# Patient Record
Sex: Female | Born: 1955 | Race: White | Hispanic: No | Smoking: Former smoker
Health system: Southern US, Community
[De-identification: ages and names within clinical notes are randomized; demographics above are authoritative.]

## PROBLEM LIST (undated history)

## (undated) DIAGNOSIS — K219 Gastro-esophageal reflux disease without esophagitis: Secondary | ICD-10-CM

## (undated) DIAGNOSIS — M1990 Unspecified osteoarthritis, unspecified site: Secondary | ICD-10-CM

## (undated) DIAGNOSIS — F909 Attention-deficit hyperactivity disorder, unspecified type: Secondary | ICD-10-CM

## (undated) DIAGNOSIS — R351 Nocturia: Secondary | ICD-10-CM

## (undated) DIAGNOSIS — G4700 Insomnia, unspecified: Secondary | ICD-10-CM

## (undated) DIAGNOSIS — F419 Anxiety disorder, unspecified: Secondary | ICD-10-CM

## (undated) DIAGNOSIS — Z8719 Personal history of other diseases of the digestive system: Secondary | ICD-10-CM

## (undated) DIAGNOSIS — G709 Myoneural disorder, unspecified: Secondary | ICD-10-CM

## (undated) DIAGNOSIS — IMO0001 Reserved for inherently not codable concepts without codable children: Secondary | ICD-10-CM

## (undated) DIAGNOSIS — K5732 Diverticulitis of large intestine without perforation or abscess without bleeding: Secondary | ICD-10-CM

## (undated) DIAGNOSIS — I10 Essential (primary) hypertension: Secondary | ICD-10-CM

## (undated) DIAGNOSIS — B1920 Unspecified viral hepatitis C without hepatic coma: Secondary | ICD-10-CM

## (undated) HISTORY — DX: Unspecified osteoarthritis, unspecified site: M19.90

## (undated) HISTORY — DX: Gastro-esophageal reflux disease without esophagitis: K21.9

## (undated) HISTORY — PX: LEG TENDON SURGERY: SHX1004

## (undated) HISTORY — PX: OTHER SURGICAL HISTORY: SHX169

## (undated) HISTORY — PX: VAGINAL HYSTERECTOMY: SUR661

## (undated) HISTORY — DX: Diverticulitis of large intestine without perforation or abscess without bleeding: K57.32

## (undated) HISTORY — PX: ABDOMINAL SURGERY: SHX537

## (undated) HISTORY — DX: Essential (primary) hypertension: I10

## (undated) HISTORY — DX: Myoneural disorder, unspecified: G70.9

---

## 1980-12-02 HISTORY — PX: ECTOPIC PREGNANCY SURGERY: SHX613

## 2003-10-11 ENCOUNTER — Other Ambulatory Visit: Payer: Self-pay

## 2003-12-17 ENCOUNTER — Other Ambulatory Visit: Payer: Self-pay

## 2004-11-15 ENCOUNTER — Inpatient Hospital Stay (HOSPITAL_COMMUNITY): Admission: EM | Admit: 2004-11-15 | Discharge: 2004-11-21 | Payer: Self-pay | Admitting: Emergency Medicine

## 2004-11-15 ENCOUNTER — Ambulatory Visit: Payer: Self-pay | Admitting: Gastroenterology

## 2004-12-02 HISTORY — PX: CHOLECYSTECTOMY: SHX55

## 2004-12-11 ENCOUNTER — Ambulatory Visit: Payer: Self-pay | Admitting: Gastroenterology

## 2004-12-12 ENCOUNTER — Ambulatory Visit: Payer: Self-pay | Admitting: Gastroenterology

## 2005-03-09 ENCOUNTER — Emergency Department (HOSPITAL_COMMUNITY): Admission: EM | Admit: 2005-03-09 | Discharge: 2005-03-09 | Payer: Self-pay | Admitting: Emergency Medicine

## 2005-06-14 ENCOUNTER — Emergency Department (HOSPITAL_COMMUNITY): Admission: EM | Admit: 2005-06-14 | Discharge: 2005-06-15 | Payer: Self-pay | Admitting: Emergency Medicine

## 2005-06-24 ENCOUNTER — Ambulatory Visit: Payer: Self-pay | Admitting: Gastroenterology

## 2005-06-25 ENCOUNTER — Ambulatory Visit: Payer: Self-pay | Admitting: Gastroenterology

## 2005-09-08 ENCOUNTER — Emergency Department (HOSPITAL_COMMUNITY): Admission: EM | Admit: 2005-09-08 | Discharge: 2005-09-08 | Payer: Self-pay | Admitting: Family Medicine

## 2005-09-10 ENCOUNTER — Encounter: Admission: RE | Admit: 2005-09-10 | Discharge: 2005-09-10 | Payer: Self-pay | Admitting: Gastroenterology

## 2005-10-11 ENCOUNTER — Emergency Department (HOSPITAL_COMMUNITY): Admission: EM | Admit: 2005-10-11 | Discharge: 2005-10-12 | Payer: Self-pay | Admitting: Emergency Medicine

## 2005-11-08 ENCOUNTER — Ambulatory Visit: Payer: Self-pay | Admitting: Gastroenterology

## 2005-11-12 ENCOUNTER — Ambulatory Visit: Payer: Self-pay | Admitting: Gastroenterology

## 2005-12-04 ENCOUNTER — Ambulatory Visit: Payer: Self-pay | Admitting: Gastroenterology

## 2005-12-19 ENCOUNTER — Emergency Department (HOSPITAL_COMMUNITY): Admission: EM | Admit: 2005-12-19 | Discharge: 2005-12-19 | Payer: Self-pay | Admitting: Family Medicine

## 2006-01-21 ENCOUNTER — Ambulatory Visit: Payer: Self-pay | Admitting: Gastroenterology

## 2006-04-17 ENCOUNTER — Ambulatory Visit: Payer: Self-pay | Admitting: Gastroenterology

## 2006-05-01 ENCOUNTER — Ambulatory Visit: Payer: Self-pay | Admitting: Gastroenterology

## 2006-05-08 ENCOUNTER — Emergency Department (HOSPITAL_COMMUNITY): Admission: EM | Admit: 2006-05-08 | Discharge: 2006-05-09 | Payer: Self-pay | Admitting: Emergency Medicine

## 2006-05-09 ENCOUNTER — Ambulatory Visit: Payer: Self-pay | Admitting: Internal Medicine

## 2006-05-15 ENCOUNTER — Ambulatory Visit: Payer: Self-pay | Admitting: Gastroenterology

## 2006-05-29 ENCOUNTER — Ambulatory Visit: Payer: Self-pay | Admitting: Gastroenterology

## 2006-06-05 ENCOUNTER — Ambulatory Visit (HOSPITAL_COMMUNITY): Admission: RE | Admit: 2006-06-05 | Discharge: 2006-06-05 | Payer: Self-pay | Admitting: Gastroenterology

## 2006-06-12 ENCOUNTER — Ambulatory Visit: Payer: Self-pay | Admitting: Gastroenterology

## 2006-06-26 ENCOUNTER — Ambulatory Visit: Payer: Self-pay | Admitting: Gastroenterology

## 2006-07-17 ENCOUNTER — Ambulatory Visit: Payer: Self-pay | Admitting: Family Medicine

## 2006-07-17 ENCOUNTER — Observation Stay (HOSPITAL_COMMUNITY): Admission: EM | Admit: 2006-07-17 | Discharge: 2006-07-18 | Payer: Self-pay | Admitting: Emergency Medicine

## 2006-07-21 ENCOUNTER — Ambulatory Visit: Payer: Self-pay | Admitting: Gastroenterology

## 2006-07-24 ENCOUNTER — Ambulatory Visit: Payer: Self-pay | Admitting: Gastroenterology

## 2006-08-07 ENCOUNTER — Ambulatory Visit: Payer: Self-pay | Admitting: Gastroenterology

## 2006-08-22 ENCOUNTER — Ambulatory Visit: Payer: Self-pay | Admitting: Gastroenterology

## 2006-09-04 ENCOUNTER — Ambulatory Visit: Payer: Self-pay | Admitting: Gastroenterology

## 2006-09-10 ENCOUNTER — Emergency Department (HOSPITAL_COMMUNITY): Admission: EM | Admit: 2006-09-10 | Discharge: 2006-09-10 | Payer: Self-pay | Admitting: Emergency Medicine

## 2006-09-18 ENCOUNTER — Ambulatory Visit: Payer: Self-pay | Admitting: Gastroenterology

## 2006-09-23 ENCOUNTER — Ambulatory Visit: Payer: Self-pay | Admitting: Gastroenterology

## 2006-09-25 ENCOUNTER — Ambulatory Visit: Payer: Self-pay | Admitting: Gastroenterology

## 2006-11-17 ENCOUNTER — Ambulatory Visit: Payer: Self-pay | Admitting: Gastroenterology

## 2006-11-20 ENCOUNTER — Ambulatory Visit: Payer: Self-pay | Admitting: Gastroenterology

## 2006-12-07 ENCOUNTER — Emergency Department (HOSPITAL_COMMUNITY): Admission: EM | Admit: 2006-12-07 | Discharge: 2006-12-07 | Payer: Self-pay | Admitting: Emergency Medicine

## 2007-01-30 ENCOUNTER — Inpatient Hospital Stay (HOSPITAL_COMMUNITY): Admission: EM | Admit: 2007-01-30 | Discharge: 2007-02-05 | Payer: Self-pay | Admitting: Emergency Medicine

## 2007-01-30 ENCOUNTER — Encounter (INDEPENDENT_AMBULATORY_CARE_PROVIDER_SITE_OTHER): Payer: Self-pay | Admitting: *Deleted

## 2007-02-08 ENCOUNTER — Emergency Department (HOSPITAL_COMMUNITY): Admission: EM | Admit: 2007-02-08 | Discharge: 2007-02-08 | Payer: Self-pay | Admitting: Emergency Medicine

## 2007-04-16 ENCOUNTER — Ambulatory Visit: Payer: Self-pay | Admitting: Gastroenterology

## 2007-06-09 ENCOUNTER — Ambulatory Visit (HOSPITAL_COMMUNITY): Admission: RE | Admit: 2007-06-09 | Discharge: 2007-06-09 | Payer: Self-pay | Admitting: Gastroenterology

## 2007-07-04 IMAGING — CR DG ABDOMEN ACUTE W/ 1V CHEST
4 series · 4 of 4 positions shown · non-contrast
Comparison: Previous abdomen series of 05/09/06.

CLINICAL DATA: Lower abdominal pain.  Nausea.  
ACUTE ABDOMEN SERIES WITH CHEST:

[w chest pa]
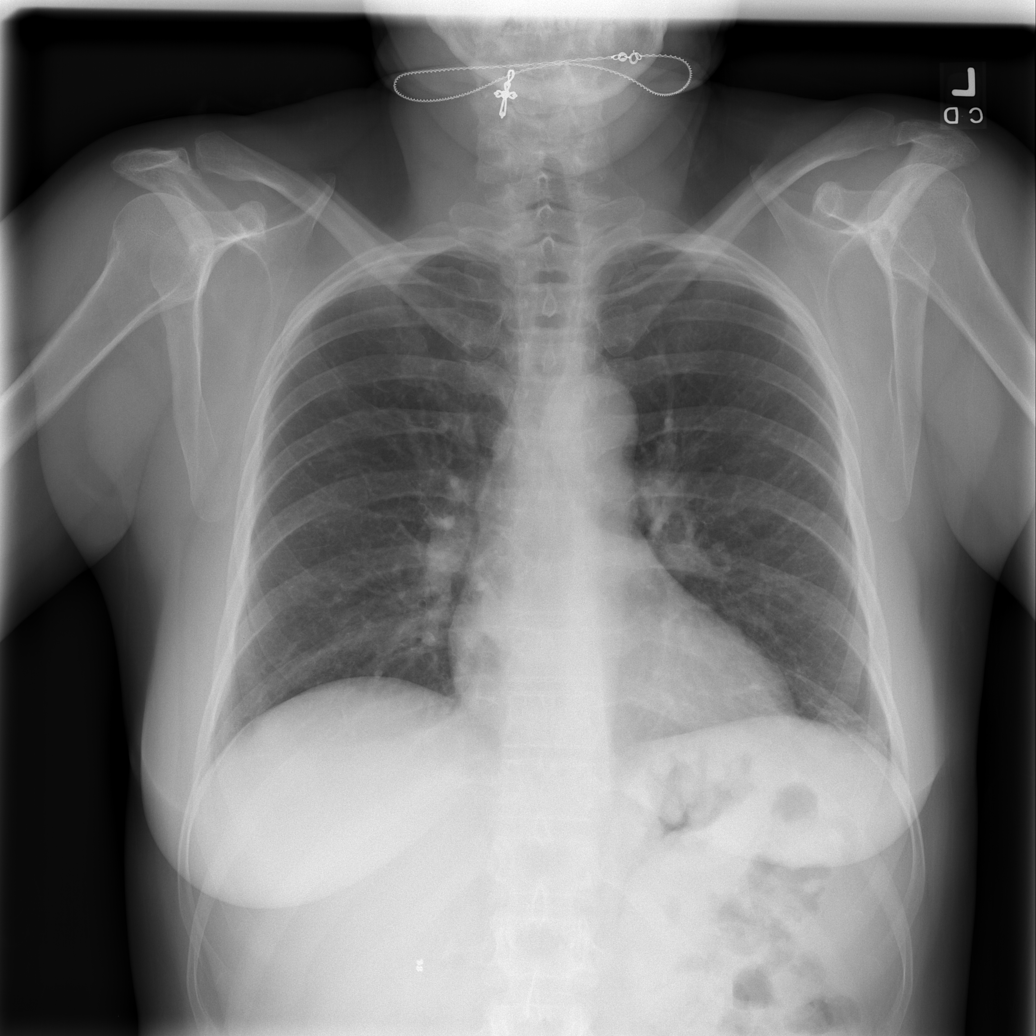

[w abdomen upright *]
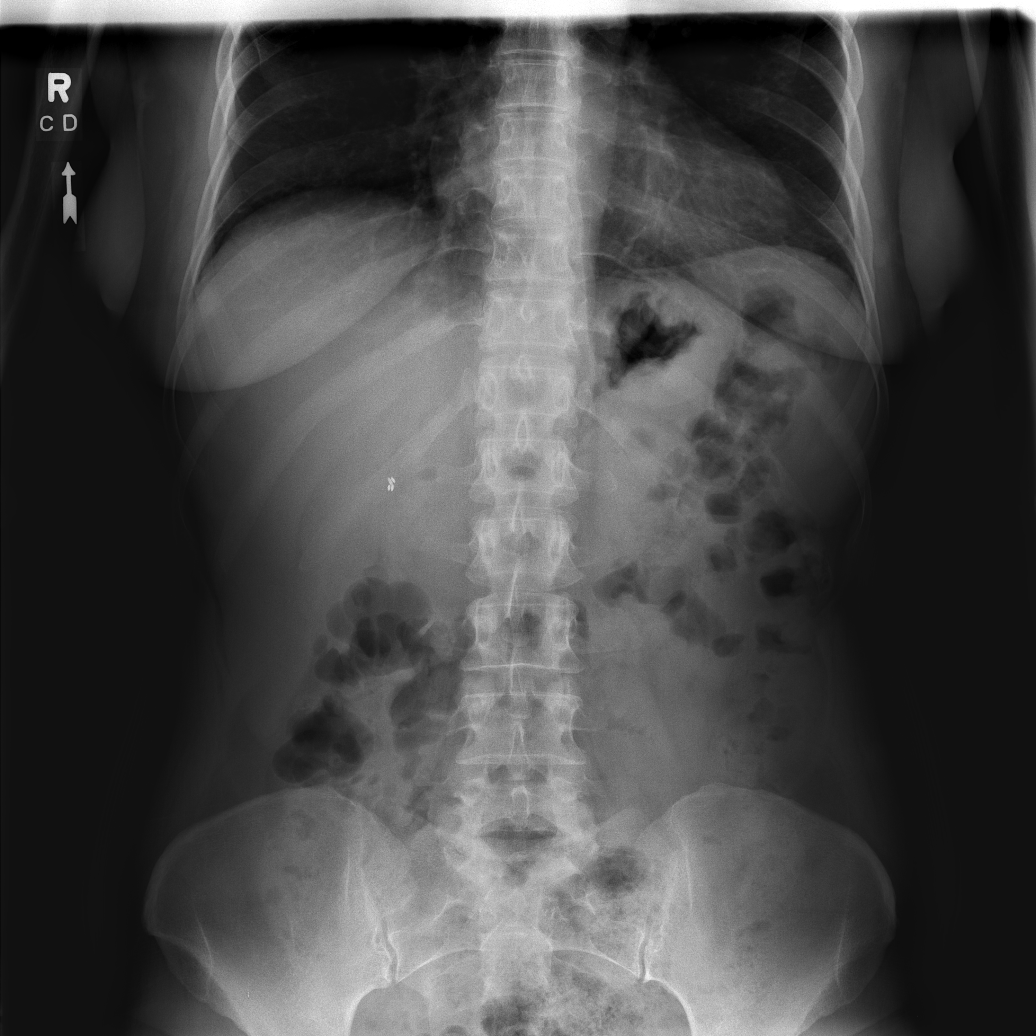

[t abdomen supine (1 of 2)]
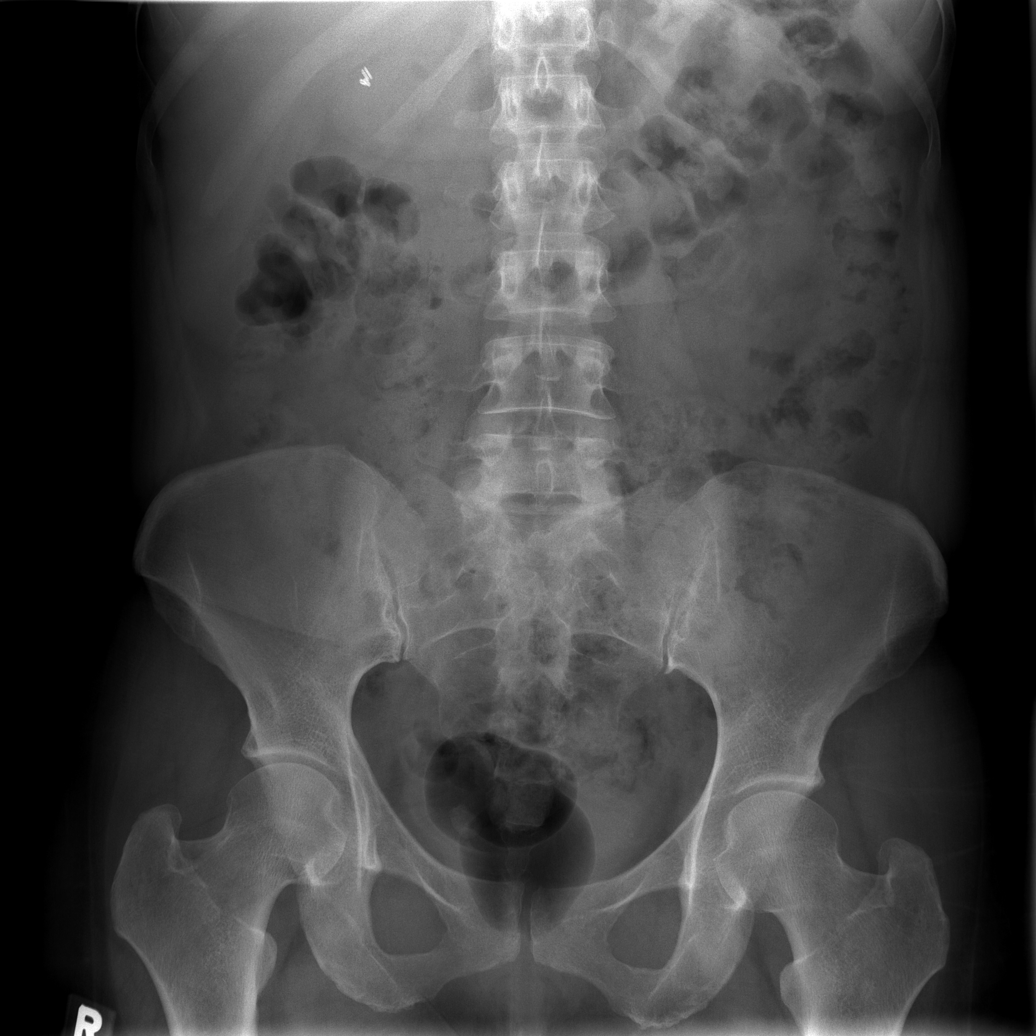

[t abdomen supine (2 of 2)]
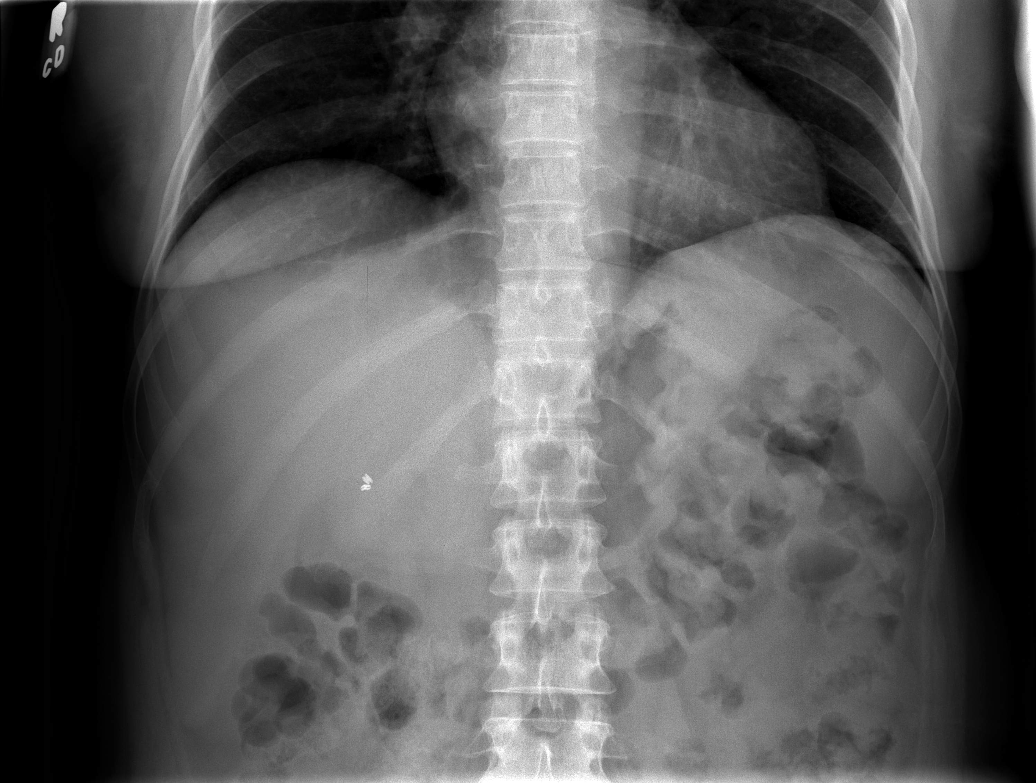

[4 of 4 positions shown; findings below may reference images not displayed]

FINDINGS: Slightly reduced lung volumes.  Mild scarring or subsegmental atelectasis left base.  No infiltrates, failure or pneumothorax.  Cardiac size is normal.
Flat and erect abdomen reveals previous cholecystectomy.  Nondilated bowel loops are seen.  There is no obstruction or free air.  I do not see significant signs of constipation, as was noted previously.
IMPRESSION: Nonspecific abdomen.

## 2008-04-23 ENCOUNTER — Emergency Department (HOSPITAL_COMMUNITY): Admission: EM | Admit: 2008-04-23 | Discharge: 2008-04-24 | Payer: Self-pay | Admitting: Emergency Medicine

## 2008-05-19 ENCOUNTER — Ambulatory Visit: Admission: RE | Admit: 2008-05-19 | Discharge: 2008-05-19 | Payer: Self-pay | Admitting: Internal Medicine

## 2008-05-19 ENCOUNTER — Ambulatory Visit: Payer: Self-pay | Admitting: Vascular Surgery

## 2008-05-19 ENCOUNTER — Encounter: Payer: Self-pay | Admitting: Internal Medicine

## 2008-09-15 ENCOUNTER — Ambulatory Visit: Payer: Self-pay | Admitting: Gastroenterology

## 2009-01-03 ENCOUNTER — Encounter: Admission: RE | Admit: 2009-01-03 | Discharge: 2009-01-03 | Payer: Self-pay | Admitting: Family Medicine

## 2009-01-05 ENCOUNTER — Inpatient Hospital Stay (HOSPITAL_COMMUNITY): Admission: AD | Admit: 2009-01-05 | Discharge: 2009-01-07 | Payer: Self-pay | Admitting: Family Medicine

## 2009-01-05 ENCOUNTER — Ambulatory Visit: Payer: Self-pay | Admitting: Gastroenterology

## 2009-02-09 ENCOUNTER — Encounter: Payer: Self-pay | Admitting: Gastroenterology

## 2009-07-18 ENCOUNTER — Encounter (INDEPENDENT_AMBULATORY_CARE_PROVIDER_SITE_OTHER): Payer: Self-pay | Admitting: *Deleted

## 2009-07-18 ENCOUNTER — Emergency Department (HOSPITAL_COMMUNITY): Admission: EM | Admit: 2009-07-18 | Discharge: 2009-07-18 | Payer: Self-pay | Admitting: Emergency Medicine

## 2009-10-02 ENCOUNTER — Telehealth: Payer: Self-pay | Admitting: Gastroenterology

## 2009-10-02 ENCOUNTER — Ambulatory Visit: Payer: Self-pay | Admitting: Gastroenterology

## 2009-10-02 DIAGNOSIS — K625 Hemorrhage of anus and rectum: Secondary | ICD-10-CM | POA: Insufficient documentation

## 2009-10-02 DIAGNOSIS — R1013 Epigastric pain: Secondary | ICD-10-CM | POA: Insufficient documentation

## 2009-10-02 DIAGNOSIS — F411 Generalized anxiety disorder: Secondary | ICD-10-CM | POA: Insufficient documentation

## 2009-10-10 ENCOUNTER — Ambulatory Visit: Payer: Self-pay | Admitting: Gastroenterology

## 2009-10-10 ENCOUNTER — Encounter: Payer: Self-pay | Admitting: Gastroenterology

## 2009-10-11 ENCOUNTER — Encounter: Payer: Self-pay | Admitting: Gastroenterology

## 2009-10-13 ENCOUNTER — Encounter (INDEPENDENT_AMBULATORY_CARE_PROVIDER_SITE_OTHER): Payer: Self-pay | Admitting: *Deleted

## 2009-10-16 ENCOUNTER — Ambulatory Visit: Payer: Self-pay | Admitting: Gastroenterology

## 2009-10-19 ENCOUNTER — Ambulatory Visit: Payer: Self-pay | Admitting: Gastroenterology

## 2009-12-02 HISTORY — PX: HERNIA REPAIR: SHX51

## 2009-12-02 HISTORY — PX: COLECTOMY: SHX59

## 2009-12-02 HISTORY — PX: APPENDECTOMY: SHX54

## 2010-02-28 ENCOUNTER — Ambulatory Visit: Payer: Self-pay | Admitting: Internal Medicine

## 2010-02-28 ENCOUNTER — Observation Stay (HOSPITAL_COMMUNITY): Admission: AD | Admit: 2010-02-28 | Discharge: 2010-03-02 | Payer: Self-pay | Admitting: Gastroenterology

## 2010-02-28 ENCOUNTER — Encounter: Payer: Self-pay | Admitting: Gastroenterology

## 2010-02-28 DIAGNOSIS — F988 Other specified behavioral and emotional disorders with onset usually occurring in childhood and adolescence: Secondary | ICD-10-CM | POA: Insufficient documentation

## 2010-02-28 DIAGNOSIS — Z8719 Personal history of other diseases of the digestive system: Secondary | ICD-10-CM | POA: Insufficient documentation

## 2010-02-28 DIAGNOSIS — R109 Unspecified abdominal pain: Secondary | ICD-10-CM | POA: Insufficient documentation

## 2010-03-02 ENCOUNTER — Encounter: Payer: Self-pay | Admitting: Gastroenterology

## 2010-03-12 ENCOUNTER — Ambulatory Visit: Payer: Self-pay | Admitting: Gastroenterology

## 2010-03-13 ENCOUNTER — Telehealth: Payer: Self-pay | Admitting: Gastroenterology

## 2010-03-19 ENCOUNTER — Encounter: Payer: Self-pay | Admitting: Gastroenterology

## 2010-04-06 ENCOUNTER — Inpatient Hospital Stay (HOSPITAL_COMMUNITY): Admission: RE | Admit: 2010-04-06 | Discharge: 2010-04-12 | Payer: Self-pay | Admitting: General Surgery

## 2010-04-06 ENCOUNTER — Encounter (INDEPENDENT_AMBULATORY_CARE_PROVIDER_SITE_OTHER): Payer: Self-pay | Admitting: General Surgery

## 2011-01-01 NOTE — Progress Notes (Signed)
Summary: Triage--Pain   Phone Note Call from Patient Call back at [phone]   Caller: Patient Call For: Arlyce Dice Reason for Call: Talk to Nurse Summary of Call: Patient wants to speak to nurse because her stomach swollen and sore. Initial call taken by: Tawni Levy,  March 13, 2010 2:27 PM  Follow-up for Phone Call        Pt. was seen in the office yesterday, felt better.  Today pt. c/o abd. swelling and mid.abd.pain.  Today is the last day of her antibiotics. She Is scheduled to see the surgeon on 03-19-10.   1) Clear liquids x24 hours. Advance to soft,bland diet x2-4 days. Advanced as tolerated. 2) Complete antibiotics 3) Continue Vicodin PRN 4) I will call pt., if new orders, after MD reviews.  [facility] PLEASE ADVISE   Follow-up by: Laureen Ochs LPN,  March 13, 2010 2:48 PM  Additional Follow-up for Phone Call Additional follow up Details #1::        ok Additional Follow-up by: Louis Meckel MD,  March 13, 2010 4:04 PM

## 2011-01-01 NOTE — Assessment & Plan Note (Signed)
Summary: POST HOSPITAL F/U                 [facility]    History of Present Illness Visit Type: Follow-up Visit Primary GI MD: Melvia Heaps MD [facility] Primary Provider: Dani Gobble, PA-C Chief Complaint: Patient here for hospital f/u for diverticulitis. Patient c/o continued left lower quadrant abdominal pain and bloating. She denies any diarrhea or other GI problems. History of Present Illness:   Ms. Hackmann has returned following hospitalization for acute diverticulitis.  Since discharge her abdominal pain has subsided.  She remains on Cipro and Flagyl.  She has had at least 3 documented episodes of acute colonic diverticulitis.  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  Since 2005 she has had 13 CTs and MRIs for recurrent abdominal pain.  Colonoscopy demonstrates scattered diverticula throughout the entire colon.  Her last bout of acute diverticulitis involved the transverse colon.   GI Review of Systems    Reports abdominal pain, bloating, and  heartburn.     Location of  Abdominal pain: LLQ.    Denies acid reflux, belching, chest pain, dysphagia with liquids, dysphagia with solids, loss of appetite, nausea, vomiting, vomiting blood, weight loss, and  weight gain.      Reports diverticulosis, hemorrhoids, and  liver problems.     Denies anal fissure, black tarry stools, change in bowel habit, constipation, diarrhea, fecal incontinence, heme positive stool, irritable bowel syndrome, jaundice, light color stool, rectal bleeding, and  rectal pain.    Current Medications (verified): 1)  Metamucil 30.9 % Powd (Psyllium) .... As Needed 2)  Cipro 500 Mg Tabs (Ciprofloxacin Hcl) .... Take 1 Tablet By Mouth Two Times A Day 3)  Flagyl 500 Mg Tabs (Metronidazole) .... Take 1 Tablet By Mouth Two Times A Day 4)  Vicodin 5-500 Mg Tabs (Hydrocodone-Acetaminophen) .... Take 1 Tablet By Mouth Every 4-6 Hours As Needed 5)  Promethazine Hcl 25 Mg Tabs  (Promethazine Hcl) .... Take 1 Tablet As Needed For Nausea.  Allergies (verified): No Known Drug Allergies  Past History:  Past Medical History: Reviewed history from 02/28/2010 and no changes required. Diverticulitis Esophageal Stricture hyperplastic polyps 12/2004 Arthritis panic disorder Depression Sleep Apnea hx of hepatitis C Anxiety Disorder ADD Perforated Jejunal Diverticulum (foreign body)  Past Surgical History: Cholecystectomy Small bowel resection (jejunum) Hysterectomy Ectopic pregnancy- x 2 urethral cyst removed ovarian cystectomy  Family History: Family History of Heart Disease: Father, Paternal Grandmother Family History of Kidney Disease:Mother No FH of Colon Cancer: Family History of Pancreatic Cancer: Father Family History of Diabetes: Paternal Grandmother, Granddaughter  Social History: Occupation: Engineer, 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 -stopped 14 years ago Alcohol Use - yes-rare Daily Caffeine Use Illicit Drug Use - no  Review of Systems       The patient complains of anxiety-new, fatigue, fever, muscle pains/cramps, skin rash, and urination changes/pain.  The patient denies allergy/sinus, anemia, arthritis/joint pain, back pain, blood in urine, breast changes/lumps, change in vision, confusion, cough, coughing up blood, depression-new, fainting, headaches-new, hearing problems, heart murmur, heart rhythm changes, itching, menstrual pain, night sweats, nosebleeds, pregnancy symptoms, shortness of breath, sleeping problems, sore throat, swelling of feet/legs, swollen lymph glands, thirst - excessive , urination - excessive , urine leakage, vision changes, and voice change.    Vital Signs:  Patient profile:   55 year old female Height:      65 inches Weight:      158 pounds BMI:  26.39 BSA:     1.79 Pulse rate:   88 / minute Pulse rhythm:   regular BP sitting:   130 / 80  (left arm)  Vitals Entered By: Hortense Ramal CMA Duncan Dull)  (March 12, 2010 3:12 PM)  Physical Exam  Additional Exam:  She is a well-developed well-nourished female  Abdomen is without masses, tenderness organomegaly   Impression & Recommendations:  Problem # 1:  Hx of DIVERTICULITIS, HX OF (ICD-V12.79)  The patient has had recurrent colonic diverticulitis documented by CT scan at least 3 times in the past 6 years and numerous episodes of abdominal pain thought to be diverticulitis.  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  The patient is understandably concerned about recurrent episodes and  the need for emergency surgery and possible colostomy.  Elective surgery would require a subtotal colectomy because of the distribution of her diverticula.  Recommendations #1 surgical consultation for consideration of subtotal colectomy - Dr. Larey Brick  Orders: [facility] ([facility])  Patient Instructions: 1)  Go to Dr. Abbey Chatters 03/19/2010 arrive at 11:30am. 2)  Copy sent to : Dani Gobble, PA-C 3)  The medication list was reviewed and reconciled.  All changed / newly prescribed medications were explained.  A complete medication list was provided to the patient / caregiver.

## 2011-01-01 NOTE — Letter (Signed)
Summary: [facility]   Imported By: Sherian Rein 04/10/2010 09:34:39  _____________________________________________________________________  External Attachment:    Type:   Image     Comment:   External Document

## 2011-01-01 NOTE — Initial Assessments (Signed)
Summary: SEVERE ABD PAIN/HX. DIVERTICULITIS           (DR.KAPLAN PT.) ...    History of Present Illness Visit Type: Follow-up Visit Primary GI MD: Melvia Heaps MD [facility] Primary Provider: [facility] Chief Complaint: severe abdominal pain x 3 days; diverticulitis History of Present Illness:   Followed by Dr. Arlyce Dice for polyps, diverticulosis and esophageal strictures. She has history of recurrent diverticulitis, last hospitalized in Feb 2010.  Worked in for three day history of nausea, vomiting and mid to upper abdominal pain which has become severe over last two days. Pain unrelated to meals. Hurts worse to lay flat. Subjective fevers.  Patient is crying, appears very uncomfortable. No chest pain or SOB. No NSAID use.   GI Review of Systems    Reports abdominal pain, bloating, nausea, and  vomiting.     Location of  Abdominal pain: mid.    Denies acid reflux, belching, chest pain, dysphagia with liquids, dysphagia with solids, heartburn, loss of appetite, vomiting blood, weight loss, and  weight gain.      Reports diverticulosis.     Denies anal fissure, black tarry stools, change in bowel habit, constipation, diarrhea, fecal incontinence, heme positive stool, hemorrhoids, irritable bowel syndrome, jaundice, light color stool, liver problems, rectal bleeding, and  rectal pain.    Current Medications (verified): 1)  Metamucil 30.9 % Powd (Psyllium) .... As Needed  Allergies (verified): No Known Drug Allergies  Past History:  Past Medical History: Diverticulitis Esophageal Stricture hyperplastic polyps 12/2004 Arthritis panic disorder Depression Sleep Apnea hx of hepatitis C Anxiety Disorder ADD Perforated Jejunal Diverticulum (foreign body)  Past Surgical History: Cholecystectomy Small bowel resection (jejunum) Hysterectomy  Family History: Reviewed history from 10/02/2009 and no changes required. Family History of Heart Disease: Father Family History of Kidney  Disease:Mother No FH of Colon Cancer:  Social History: Reviewed history from 10/02/2009 and no changes required. 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  Alcohol Use - yes Daily Caffeine Use Illicit Drug Use - no  Review of Systems       The patient complains of fatigue, fever, headaches-new, muscle pains/cramps, night sweats, sleeping problems, and urination changes/pain.  The patient denies allergy/sinus, anemia, arthritis/joint pain, back pain, blood in urine, breast changes/lumps, change in vision, confusion, cough, coughing up blood, depression-new, fainting, hearing problems, heart murmur, heart rhythm changes, itching, menstrual pain, nosebleeds, pregnancy symptoms, shortness of breath, skin rash, sore throat, swelling of feet/legs, swollen lymph glands, thirst - excessive , urination - excessive , urine leakage, vision changes, and voice change.    Vital Signs:  Patient profile:   55 year old female Height:      65 inches Weight:      158.13 pounds Temp:     98.5 degrees F oral Pulse rate:   100 / minute Pulse rhythm:   regular BP sitting:   130 / 80  (left arm) Cuff size:   regular  Vitals Entered By: June McMurray CMA Duncan Dull) (February 28, 2010 10:22 AM)  Physical Exam  General:  Pleasant, anxious white female, visible uncomfortable  Head:  Normocephalic and atraumatic. Eyes:  Conjunctiva pink, no icterus.  Mouth:  No oral lesions. Tongue moist.  Neck:  no obvious masses  Lungs:  Clear throughout to auscultation. Heart:  Regular rate and rhythm; no murmurs, rubs,  or bruits. Abdomen:  Soft, mild distention upper abdomen. There is marked tenderness of mid to upper abdomen.  Msk:  Symmetrical with no gross  deformities. Normal posture. Extremities:  No palmar erythema, no edema.  Neurologic:  Alert and  oriented x4;  grossly normal neurologically. Skin:  Intact without significant lesions or rashes. Psych:  Pleasant, anxious,  teary-eyed.   Impression & Recommendations:  Problem # 1:  ABDOMINAL PAIN-MULTIPLE SITES (ICD-789.09) Assessment New Three day history of severe mid to upper abdominal associated with nausea and vomiting. Despite location, pain feels somewhat like diverticulitis pain. She does have history of recurrent diverticulitis. Also has history of perforated jejunal diverticulum, possibly secondary to foreign body.  Patient is significantly tender in mid to upper abdomen. She needs immediate evaluation and treatment. Will admit patient to [facility]. Given history, diverticulitis is possibility but given location of pain need to consider biliary disease, pancreatitis, perforated ulcer. Will obtain stat labs, CTscan, treat empirically with IV antibiotics.  Surgical consult pending those results.  Case discussed with Dr. Leone Payor.  Problem # 2:  ADD (ICD-314.00) Assessment: Comment Only History of ADD and severe anxiety. Currently not on treatment.

## 2011-02-19 LAB — DIFFERENTIAL
Eosinophils Relative: 2 % (ref 0–5)
Lymphocytes Relative: 33 % (ref 12–46)
Lymphs Abs: 1.8 10*3/uL (ref 0.7–4.0)
Monocytes Relative: 6 % (ref 3–12)

## 2011-02-19 LAB — COMPREHENSIVE METABOLIC PANEL
AST: 16 U/L (ref 0–37)
Albumin: 4.4 g/dL (ref 3.5–5.2)
CO2: 28 mEq/L (ref 19–32)
Calcium: 9.4 mg/dL (ref 8.4–10.5)
Creatinine, Ser: 0.58 mg/dL (ref 0.4–1.2)
GFR calc Af Amer: >60 mL/min (ref >60)
GFR calc non Af Amer: >60 mL/min (ref >60)
Total Protein: 7.2 g/dL (ref 6.0–8.3)

## 2011-02-19 LAB — CBC
Hemoglobin: 13.2 g/dL (ref 12.0–15.0)
MCHC: 33.8 g/dL (ref 30.0–36.0)
MCV: 89.3 fL (ref 78.0–100.0)
Platelets: 207 10*3/uL (ref 150–400)
Platelets: 237 10*3/uL (ref 150–400)
RDW: 12.4 % (ref 11.5–15.5)
RDW: 12.5 % (ref 11.5–15.5)

## 2011-02-19 LAB — TYPE AND SCREEN
ABO/RH(D): A NEG
Antibody Screen: NEGATIVE

## 2011-02-19 LAB — BASIC METABOLIC PANEL
BUN: 9 mg/dL (ref 6–23)
Calcium: 8 mg/dL — ABNORMAL LOW (ref 8.4–10.5)
GFR calc non Af Amer: >60 mL/min (ref >60)
Glucose, Bld: 142 mg/dL — ABNORMAL HIGH (ref 70–99)
Sodium: 143 mEq/L (ref 135–145)

## 2011-02-19 LAB — PROTIME-INR: Prothrombin Time: 12.9 seconds (ref 11.6–15.2)

## 2011-02-20 LAB — CBC
Hemoglobin: 13.1 g/dL (ref 12.0–15.0)
RBC: 4.35 MIL/uL (ref 3.87–5.11)

## 2011-02-24 LAB — CBC
HCT: 40.4 % (ref 36.0–46.0)
Hemoglobin: 13.8 g/dL (ref 12.0–15.0)
MCV: 89.1 fL (ref 78.0–100.0)
RBC: 4.53 MIL/uL (ref 3.87–5.11)
WBC: 9.5 10*3/uL (ref 4.0–10.5)

## 2011-02-24 LAB — PROTIME-INR
INR: 0.98 (ref 0.00–1.49)
Prothrombin Time: 12.9 seconds (ref 11.6–15.2)

## 2011-02-24 LAB — COMPREHENSIVE METABOLIC PANEL
AST: 24 U/L (ref 0–37)
BUN: 12 mg/dL (ref 6–23)
CO2: 23 mEq/L (ref 19–32)
Chloride: 108 mEq/L (ref 96–112)
Creatinine, Ser: 0.58 mg/dL (ref 0.4–1.2)
GFR calc non Af Amer: >60 mL/min (ref >60)
Total Bilirubin: 1.3 mg/dL — ABNORMAL HIGH (ref 0.3–1.2)

## 2011-03-09 LAB — DIFFERENTIAL
Lymphocytes Relative: 37 % (ref 12–46)
Lymphs Abs: 1.7 10*3/uL (ref 0.7–4.0)
Monocytes Absolute: 0.4 10*3/uL (ref 0.1–1.0)
Monocytes Relative: 8 % (ref 3–12)
Neutro Abs: 2.5 10*3/uL (ref 1.7–7.7)

## 2011-03-09 LAB — COMPREHENSIVE METABOLIC PANEL
Albumin: 4 g/dL (ref 3.5–5.2)
Alkaline Phosphatase: 35 U/L — ABNORMAL LOW (ref 39–117)
BUN: 15 mg/dL (ref 6–23)
Creatinine, Ser: 0.6 mg/dL (ref 0.4–1.2)
Potassium: 4.2 mEq/L (ref 3.5–5.1)
Total Protein: 7 g/dL (ref 6.0–8.3)

## 2011-03-09 LAB — CBC
HCT: 40.3 % (ref 36.0–46.0)
Platelets: 211 10*3/uL (ref 150–400)
RDW: 12.5 % (ref 11.5–15.5)

## 2011-03-09 LAB — TROPONIN I: Troponin I: 0.02 ng/mL (ref 0.00–0.06)

## 2011-03-09 LAB — POCT CARDIAC MARKERS
CKMB, poc: <1 ng/mL — ABNORMAL LOW (ref 1.0–8.0)
Myoglobin, poc: 39.3 ng/mL (ref 12–200)
Troponin i, poc: <0.05 ng/mL (ref 0.00–0.09)

## 2011-03-19 LAB — BASIC METABOLIC PANEL
BUN: 9 mg/dL (ref 6–23)
CO2: 25 mEq/L (ref 19–32)
Calcium: 8.6 mg/dL (ref 8.4–10.5)
GFR calc non Af Amer: >60 mL/min (ref >60)
Glucose, Bld: 92 mg/dL (ref 70–99)
Potassium: 4.9 mEq/L (ref 3.5–5.1)

## 2011-03-19 LAB — COMPREHENSIVE METABOLIC PANEL
AST: 19 U/L (ref 0–37)
Albumin: 3.9 g/dL (ref 3.5–5.2)
Alkaline Phosphatase: 30 U/L — ABNORMAL LOW (ref 39–117)
Chloride: 105 mEq/L (ref 96–112)
GFR calc Af Amer: >60 mL/min (ref >60)
Potassium: 4.4 mEq/L (ref 3.5–5.1)
Total Bilirubin: 0.7 mg/dL (ref 0.3–1.2)
Total Protein: 7 g/dL (ref 6.0–8.3)

## 2011-03-19 LAB — CBC
HCT: 37.1 % (ref 36.0–46.0)
MCHC: 34.2 g/dL (ref 30.0–36.0)
Platelets: 151 10*3/uL (ref 150–400)
Platelets: 204 10*3/uL (ref 150–400)
RDW: 12.2 % (ref 11.5–15.5)
WBC: 5.8 10*3/uL (ref 4.0–10.5)

## 2011-03-23 ENCOUNTER — Other Ambulatory Visit: Payer: Self-pay | Admitting: Family Medicine

## 2011-03-23 ENCOUNTER — Ambulatory Visit (HOSPITAL_COMMUNITY)
Admission: RE | Admit: 2011-03-23 | Discharge: 2011-03-23 | Disposition: A | Discharge status: Home / Self Care | Payer: BC Managed Care – PPO | Source: Ambulatory Visit | Attending: Family Medicine | Admitting: Family Medicine

## 2011-03-23 DIAGNOSIS — R079 Chest pain, unspecified: Secondary | ICD-10-CM | POA: Insufficient documentation

## 2011-03-23 DIAGNOSIS — J984 Other disorders of lung: Secondary | ICD-10-CM | POA: Insufficient documentation

## 2011-03-23 MED ORDER — IOHEXOL 300 MG/ML  SOLN: 100.0000 mL | Freq: Once | INTRAMUSCULAR | Status: AC | PRN

## 2011-04-16 NOTE — Discharge Summary (Signed)
NAMEHIBBA, SCHRAM NO.:  [id number]   MEDICAL RECORD NO.:  [id number]          PATIENT TYPE:  INP   LOCATION:  5127                         FACILITY:  [facility]   PHYSICIAN:  Pearlean Brownie, M.D.DATE OF BIRTH:  07/25/1956   DATE OF ADMISSION:  01/05/2009  DATE OF DISCHARGE:  01/07/2009                               DISCHARGE SUMMARY   PRIMARY CARE PHYSICIAN:  Clydie Braun L. Hal Hope, MD, at [facility].   GASTROENTEROLOGY:  Barbette Hair. Arlyce Dice, MD, [facility] at [place].   SURGERY:  Adolph Pollack, MD at [facility].   DISCHARGE DIAGNOSES:  1. Diverticulitis, recurrent.  2. Anxiety.  3. Gastroesophageal reflux disease.   DISCHARGE MEDICATIONS:  1. Ativan 0.5 mg p.o. nightly, the patient was given 7 of these.  2. Ciprofloxacin 500 mg p.o. b.i.d. x10 days.  3. Flagyl 500 mg p.o. t.i.d. x10 days.  4. Phenergan as needed.   DISCONTINUED MEDICATIONS:  None.   CONSULTANTS:  1. Gastroenterology at [facility].  2. Surgery, [facility], Dr. Abbey Chatters.   LABS:  White blood cells trended from 5.8-7.00, hemoglobin from 13.5-  12.7, and platelets from 204-151.  Electrolytes were within normal  limits on admission and on discharge.  LFTs were within normal limits.   STUDIES:  Acute abdomen series on January 05, 2009, showed diverticula,  low cyst of the colon.  No leakage of contrast from colon is identified.  No mass or pneumoperitoneum evident.  No cardiopulmonary disease.   BRIEF HOSPITAL COURSE:  This is a 55 year old female with history of  recurrent diverticulitis with perforation, now with failed outpatient  therapy for left descending colonic diverticulitis.  1. Diverticulitis.  The patient was admitted with left lower quadrant      pain, nausea, emesis, and mild bright red blood per rectum.  The      patient had been on outpatient therapy for her diverticulitis x3      days, off Cipro and Flagyl.  The patient was made n.p.o. on   admission and was started on IV Zosyn.  The patient was noted to      have any improved pain and was able to tolerate clears on day two      of hospital admission.  The patient's diet was advanced to full      prior to discharge and the patient was able to tolerate this.  The      patient was afebrile and vital signs stable for the course of her      hospital admission.  The patient was seen by both GI as well as      Surgery and decision was made to treat with IV antibiotics as      above, gradually advance the patient's diet from n.p.o. to full,      and provide IV rehydration.  The decision was made to pursue      outpatient surgical management of the patient's recurrent      diverticulitis.  The patient's pain had resolved, the patient was      tolerating full p.o. diet.  The patient was afebrile,  vital signs      were stable on discharge.  The patient received diverticulosis diet      teaching from Nutritional consult.  2. Anxiety.  The patient received Ativan p.r.n. for her anxiety.  The      patient will be discharged home with 1-week supply of Ativan.  This      problem was stable during the patient's hospitalization.  3. Gastroesophageal reflux disease.  This was not an issue for the      patient during this hospitalization; however, the patient was      continued on or started on Protonix 40 mg p.o. daily.   FOLLOWUP:  The patient will follow up with [facility] in 1 week.  The patient will call for an appointment.  The  patient will follow up with Dr. Abbey Chatters at [facility]  in 4 weeks for elective surgical management of the patient's recurrent  diverticulitis.   DISCHARGE INSTRUCTIONS:  The patient was given discharge instructions  regarding her medications, regarding her diverticulosis diet,  instructions to increase activity slowly.  The patient will be  discharged to home in stable and improved condition.      Bobby Rumpf, MD   Electronically Signed      Pearlean Brownie, M.D.  Electronically Signed    KC/MEDQ  D:  01/07/2009  T:  01/08/2009  Job:  [id number]   cc:   Adolph Pollack, M.D.  [facility]

## 2011-04-16 NOTE — Consult Note (Signed)
NAMEGLENDELL, SCHLOTTMAN             ACCOUNT NO.:  [id number]   MEDICAL RECORD NO.:  [id number]          PATIENT TYPE:  INP   LOCATION:  5127                         FACILITY:  [facility]   PHYSICIAN:  Thornton Park. Daphine Deutscher, MD  DATE OF BIRTH:  1956-07-16   DATE OF CONSULTATION:  01/05/2009  DATE OF DISCHARGE:                                 CONSULTATION   REQUESTING PHYSICIAN:  Wayne A. Sheffield Slider, MD   CONSULTING SURGEON:  Thornton Park. Daphine Deutscher, MD   GASTROENTEROLOGIST:  Primarily is Dr. Arlyce Dice, however, Dr. Christella Hartigan is on  this week and he is being consulted as well.   REASON FOR CONSULTATION:  Recurrent diverticulitis.   HISTORY OF PRESENT ILLNESS:  Ms. [name] is a 55 year old white female  who has a history of jejunal diverticulitis with perforation, which  resulted in exploratory laparotomy and small bowel resection in 2008  along with hepatitis C as well as other episodes of colonic  diverticulitis in the past which has been treated as an outpatient.  This patient began having left-sided abdominal pain approximately 2-3  days ago.  At that time, she was seen, I believe at the [facility] and  put on Cipro and Flagyl.  Despite these antibiotics, her pain worsened  and at this time was admitted to [facility] today for IV antibiotics as  well as IV fluids.  At this time, she does not have any labs currently,  however, she did have labs as well as a CT scan done on the January 2.  At this time, her white blood cell count was 11,600 and a CT of the  abdomen and pelvis revealed uncomplicated acute descending colon  diverticulitis with no perforation.  At this time, we were consulted to  assist with the patient's care.   REVIEW OF SYSTEMS:  Please see HPI.  Otherwise, the patient does admit  to having some mild bright red blood per rectum, however, she states she  typically gets this with each episode of diverticulitis.  Otherwise, she  denies chest pain, shortness of breath, or edema in her  extremities,  however, she does admit to some facial and hand edema.  Otherwise, all  other systems are negative.   PAST MEDICAL HISTORY:  1. History of small bowel diverticulitis, which resulted in small      bowel resection in April 2008.  2. Recurrent colonic diverticulitis, which has been treated as an      outpatient.  3. Hepatitis C.  4. GERD.  5. ADHD.  6. Anxiety.  7. Esophageal strictures status post dilatation by Dr. Arlyce Dice.   PAST SURGICAL HISTORY:  1. Status post hysterectomy.  2. Status post cholecystectomy.  3. Status post exploratory laparotomy with small bowel resection.  4. Status post PCL repair.   SOCIAL HISTORY:  The patient denies tobacco or alcohol.  I believe the  patient stated to me that she was married and she has 2 daughters.   ALLERGIES:  NKDA.   MEDICATIONS AT HOME:  1. Cipro 500 mg 1 p.o. b.i.d.  2. Flagyl 500 mg 1 p.o. t.i.d. for the past 2  days.  3. Phenergan p.r.n.  4. Vicodin p.r.n.   PHYSICAL EXAMINATION:  GENERAL:  Ms. Maris is a very pleasant 52-year-  old white female who is well developed and well nourished and currently  in no acute distress.  VITAL SIGNS:  Temperature 97.7, pulse 59, respirations 16, and blood  pressure 111/72.  HEENT:  Head is normocephalic and atraumatic.  Sclerae noninjected.  Pupils are equal, round, and reactive to light.  Ears and nose without  any obvious masses or lesions, however she does have a nose piercing.  Mouth is pink and moist.  Throat shows no exudate.  HEART:  Regular rate and rhythm.  Normal S1 and S2.  No murmurs,  gallops, or rubs are noted.  A +2 carotid, radial, and pedal pulses  bilaterally.  LUNGS:  Clear to auscultation bilaterally.  No wheezes, rhonchi, or  rales noted.  Respiratory effort is nonlabored.  ABDOMEN:  Soft, very tender in the left upper quadrant with guarding.  Otherwise, she does not have any rebound, tenderness, or peritoneal  signs.  She does have active bowel  sounds and is nondistended.  Otherwise, she does not have any masses or hernias noted.  MUSCULOSKELETAL:  All extremities are symmetrical.  There is no  cyanosis, clubbing, or edema.  Her left lower extremity does reveal scar  on the anterior aspect of her extremity with a screw palpable from prior  PCL surgery, I believe.  SKIN:  Warm and dry without any obvious massive lesions or rashes.  NEUROLOGIC:  Cranial nerves II through XII appear to be grossly intact.  PSYCHIATRIC:  The patient is alert and oriented x3 with an appropriate  affect, however, she does seem quite anxious at this time.   LABORATORY DATA AND DIAGNOSTICS:  Currently, her labs are pending,  however, prior labs on January 2, reveal a white blood cell count of  11,600, hemoglobin of 14.1, and a platelet count of 269,000.  CT scan of  the abdomen and pelvis also done on January 2, reveals uncomplicated  acute descending colon diverticulitis with no perforation at that time.   IMPRESSION:  1. Recurrent diverticulitis.  2. Hepatitis C.  3. Attention deficit hyperactivity disorder.  4. Anxiety.   PLAN:  At this time, I agree with keeping the patient n.p.o. as well as  starting him on IV Zosyn.  At this time, there is no indication for  surgical intervention, however, we will follow the patient along with  you.  If, by some chance, the patient begins to worsen and has an  acutely worsened abdomen, the patient may need surgical intervention,  however, in the meantime, we would treat the patient conservatively.      Letha Cape, PA      Thornton Park Daphine Deutscher, MD  Electronically Signed    KEO/MEDQ  D:  01/05/2009  T:  01/06/2009  Job:  [id number]   cc:   Barbette Hair. Arlyce Dice, MD,FACG  Rachael Fee, MD  Arnette Norris. Sheffield Slider, M.D.

## 2011-04-16 NOTE — Consult Note (Signed)
NAMEGERALD, HONEA             ACCOUNT NO.:  [id number]   MEDICAL RECORD NO.:  [id number]          PATIENT TYPE:  INP   LOCATION:  5127                         FACILITY:  [facility]   PHYSICIAN:  Wayne A. Sheffield Slider, M.D.    DATE OF BIRTH:  Jan 10, 1956   DATE OF CONSULTATION:  01/05/2009  DATE OF DISCHARGE:                                 CONSULTATION   PRIMARY CARE PHYSICIAN:  Dr. Aniceto Boss at [facility].   PRIMARY SURGEON:  Adolph Pollack, MD, of [place].   PRIMARY GASTROENTEROLOGIST:  Barbette Hair. Arlyce Dice, MD, [facility] [place].   CHIEF COMPLAINT:  Left upper quadrant pain consistent with  diverticulitis who has failed outpatient treatment.   HISTORY OF PRESENT ILLNESS:  This is a 55 year old anxious white female  with a history of recurrent diverticulitis with perforation now with  failed outpatient treatment of diverticulitis of the descending colon  with Cipro and Flagyl.  She was seen on January 03, 2009, and started on  Cipro and Flagyl after a CT showed descending colonic diverticulitis.  Since then, she complains of worsening left upper quadrant pain with  mild bright red blood per rectum yesterday, but none today.  She reports  nausea and vomiting.  She denies fevers, dizziness, and syncope.  She  reports compliance with a clear diet and antibiotics.  She is very  anxious about having an abscess needing the bag.  The patient reports  three episodes of diverticulitis in the last year, treated as an  outpatient.  She had a perforation of her small bowel from jejunal  diverticulum in April 2008 with an anastomosis at that time by Dr.  Abbey Chatters.   REVIEW OF SYSTEMS:  Negative except for above.   PAST MEDICAL HISTORY:  1. History of diverticulosis/diverticulitis, status post small-bowel      resection in April 2008 after perforation of a jejunal      diverticulum.  2. Hepatitis C.  3. GERD, likely anxiety driven.  4. ADHD.  5. Esophageal stricture, status post  dilatation.  6. Anxiety.   PAST SURGICAL HISTORY:  1. Hysterectomy.  2. Cholecystectomy.  3. Small-bowel resection as above.   ALLERGIES:  No known drug allergies.   MEDICATIONS:  1. Cipro.  2. Flagyl.  3. Phenergan p.r.n.  4. Vicodin p.r.n.   SOCIAL HISTORY:  The patient lives in [place] by herself.  Daughter  lives in [place].  She has good friends in the area, they are very  supportive.  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  She denies tobacco, alcohol, or drugs.   FAMILY HISTORY:  Father died of an MI and pancreatitis.  Mother has  chronic GI problems.   PHYSICAL EXAMINATION:  VITALS:  Temperature 97.7, heart rate 59, blood  pressure 111/72, 95% on room air, and respirations 16.  She is 5 feet 6  inches tall and 144 pounds.  GENERAL:  She is very anxious appearing, but otherwise in no apparent  distress.  HEENT:  Pupils equal, round, and reactive to light.  Extraocular  movements are intact.  Moist mucous membranes.  NECK:  Supple.  CARDIOVASCULAR:  Regular rate and rhythm with no murmurs, rubs, or  gallops.  PULMONARY:  Clear to auscultation bilaterally.  ABDOMEN:  Positive bowel sounds, soft, no guarding, but is positive for  rebound.  She has diffuse tenderness to palpation that is worse in the  left upper quadrant and a well-healed scar around her umbilicus.  EXTREMITIES:  No edema.  NEUROLOGIC:  Nonfocal.  SKIN:  Good color and turgor.   LABORATORY DATA:  Labs done on January 03, 2009, at [facility]  show white blood cell count of 11.6 with 77% neutrophils, hemoglobin  14.1 and platelets 269.  Urinalysis was negative for rbc's  or anything  else.  CT of her abdomen and pelvis on January 23, 2009, showed  uncomplicated acute descending colonic diverticulitis.   ASSESSMENT AND PLAN:  This is a 55 year old white female with a history  of recurrent diverticulitis with perforation now with failed outpatient  treatment of left  descending colonic diverticulitis.  1. Diverticulitis, recurrent.  She has failed outpatient treatment.      We will make her n.p.o.  Start IV fluids and Zosyn, antiemetics,      and pain medications.  I contacted Sarah with [facility] who recommended I contacted [facility] prior to any      repeat imaging.  Due to her extreme nausea now, we will hold on the      CT due to this question that she probably could not tolerate the      contrast anyway.  We will get a KUB to look for free air.  We will      await [facility] recommendations.  We will follow her      fever curve, check a CBC and a CMET now.  Urinalysis was negative      for rbc's and we will start a PPI IV now.  2. Anxiety.  Ativan IV p.r.n. now.  3. Gastroesophageal reflux disease, PPI and Ativan.  4. Fluids, electrolytes, nutrition/gastrointestinal, n.p.o. IV fluids.  5. Prophylaxis.  Heparin and PPI.   DISPOSITION:  Pending resolution of diverticulitis.      Lupita Raider, M.D.  Electronically Signed      Wayne A. Sheffield Slider, M.D.  Electronically Signed    KS/MEDQ  D:  01/05/2009  T:  01/06/2009  Job:  [id number]

## 2011-04-19 NOTE — Assessment & Plan Note (Signed)
[facility]                         GASTROENTEROLOGY OFFICE NOTE   NAME:[name], [name]                    MRN:          [id number]  DATE:11/17/2006                            DOB:          July 27, 1956    PROBLEM:  Abdominal pain.   HISTORY OF PRESENT ILLNESS:  [name] has returned following upper  endoscopy and esophageal dilatation.  A distal esophageal stricture was  seen, and she was dilated to 18 mm.  Dysphagia is gone.  She does  complain of intermittent upper abdominal pain that radiates to both  sides.  She is extremely anxious.  She remains on Nexium.  She was  placed on Adderall for what sounds like attention deficit disorder.  She  has a history of hepatitis C.   PHYSICAL EXAMINATION:  GENERAL:  She is frigidity and nervous appearing.  VITAL SIGNS:  Pulse 84, blood pressure 120/78. Weight 154.   IMPRESSION:  1. Nonulcer dyspepsia.  I am sure this is anxiety-driven dyspepsia.  2. Esophageal stricture, asymptomatic following dilatation therapy.  3. History of hepatitis C.  4. Anxiety.   RECOMMENDATIONS:  1. Levbid 0.375 mg twice a day for 5 days, then p.r.n.  2. Begin Xanax 0.5 mg 3 times a day.     Barbette Hair. Arlyce Dice, MD,FACG  Electronically Signed    RDK/MedQ  DD: 11/17/2006  DT: 11/17/2006  Job #: [id number]   cc:   Nilda Simmer, M.D.

## 2011-04-19 NOTE — Discharge Summary (Signed)
[name], [name]             ACCOUNT NO.:  [id number]   MEDICAL RECORD NO.:  [id number]          PATIENT TYPE:  INP   LOCATION:  6742                         FACILITY:  [facility]   PHYSICIAN:  Norton Blizzard, M.D.    DATE OF BIRTH:  24-Jun-1956   DATE OF ADMISSION:  07/16/2006  DATE OF DISCHARGE:  07/18/2006                                 DISCHARGE SUMMARY   ADMISSION TEAM:  Family Practice Teaching Service   ATTENDING:  Dr. Sheffield Slider.   INTERN:  Dr. Pearletha Forge.   DATE OF ADMISSION:  July 17, 2006   DATE OF DISCHARGE:  July 18, 2006   ADMIT DIAGNOSES:  1. Flulike symptoms.  2. Hepatitis C.  3. Confusion.   DISCHARGE DIAGNOSES:  1. Chronic abdominal pain.  2. Hepatitis C.  3. Questionable reaction to ribavirin or interferon.  4. Bipolar spectrum disorder.   CONSULTS:  GI.   PROCEDURES:  1. CT abdomen and pelvis.  2. KUB.   ADMIT HISTORY:  Patient is a 55 year old female with history of hepatitis C  and diverticulitis, who came in complaining of a 1-day history of nausea,  muscle aches, diarrhea, confusion, headaches, fatigue, and right abdominal  pain that was like a man punched me.   ADMIT PHYSICAL EXAM:  Patient was afebrile with tenderness to palpation on  the right side without rebound or guarding.  Bowel sounds were positive.  Her stool was heme negative by rectal exam.  There was no stool in the  vault.  Exam was otherwise normal.   ADMIT LABS:  White blood cell count 3.5.  UA negative for infection.  Ammonia level 56.  Otherwise normal CMP.   HOSPITAL COURSE:  1. Flu-like symptoms.  Patient with a history of diverticulitis, but pain      was right-sided without a neutrophilic shift or with increased white      blood cell count.  Patient was also afebrile.  CT showed some      mesenteric stranding, but this finding was nonspecific and more      consistent with enteritidis than diverticular disease.  A KUB was      without free air or obstruction.  She has a  history of multiple CTs and      an MRI that have been normal within the past several months.  Blood      cultures were negative x2.  She had no diarrhea while she was in the      hospital, so stool cultures were not done (there is no reason with      antibiotics for patient to have c. difficile also without a lot of      diarrhea, no reason for the c. difficile).  Serum Ig were normal for      autoimmune and/or celiac disease (IgM was slightly decreased).  HIV was      nonreactive.  TSH was normal.  CRP and ESR were within normal limits,      so inflammatory bowel disease was unlikely.  UA was negative for      infection.  GI was consulted and believed  patient was more consistent      with irritable bowel syndrome and gastroenteritis.  Per patient's      hepatitis C, we discontinued her interferon and ribavirin, which may      have been contributing to her symptoms.  After these were held for 1      day, she was subjectively better on post admission day 1 and was      discharged home.   1. Hepatitis C.  We held patient's meds as above after speaking with Lahoma Rocker, who was helping manage the patient's hepatitis C at [facility]      She stated patient has genotype 2, which is a favorable genotype for      hepatitis C and patient has already had a few months of treatment, so      it was okay to discontinue her ribavirin and interferon.   1. Confusion.  None per exam and throughout the stay, although      subjectively reported by patient.  Ammonia level was increased to 56,      but did not treat due to this clinical exam.   1. Bipolar spectrum disorder.  Patient was given a written test, the MMDQ      and BSDS; both were positive for bipolar spectrum disorder, though      patient declined treatment.   DISCHARGE MEDICATIONS AND INSTRUCTIONS:  1. Adderall XR 10 mg p.o. daily.  2. Phenergan 12.5 mg p.o. q. 4 hours p.r.n. nausea.  3. Patient to stop ribavirin and interferon until  further instructions      given by Lahoma Rocker.   DISCHARGE CONDITION:  Good, stable.   Follow up with Nilda Simmer within 2 weeks and also with Lahoma Rocker to  keep followup appointment scheduled for next week.   She is follow up:  1. Ribavirin and interferon.  2. Consider treating for bipolar spectrum disorder if patient is amendable      to this.           ______________________________  Norton Blizzard, M.D.     SH/MEDQ  D:  08/11/2006  T:  08/11/2006  Job:  [id number]   cc:   Nilda Simmer, M.D.  Allean Found D. Arlyce Dice, MD,FACG

## 2011-04-19 NOTE — Discharge Summary (Signed)
NAMESHYVONNE, [name]             ACCOUNT NO.:  [id number]   MEDICAL RECORD NO.:  [id number]          PATIENT TYPE:  INP   LOCATION:  6735                         FACILITY:  [facility]   PHYSICIAN:  Maisie Fus A. Cornett, M.D.DATE OF BIRTH:  06/02/1956   DATE OF ADMISSION:  01/30/2007  DATE OF DISCHARGE:  02/05/2007                               DISCHARGE SUMMARY   REASON FOR ADMISSION:  [name] is a 55 year old female patient with  known history of diverticulitis and hepatitis C as well as ADHD.  She  had been having abdominal pain for several days worse in 24 hours prior  to presentation.  She came to the ER and white count was found to be  mildly elevated 11,600 with neutrophil count of 85%.  CT scan of the  abdomen and pelvis demonstrated free air and extravasation of CT  contrast in the left upper abdominal region consistent with small bowel  and large colon perforation probably due to diverticular disease.  Surgical consultation was requested.  On initial exam, the patient was  anxious and crying.  She was quite concerned over having a perforation  and abscess that might lead to a colostomy placement.  She was afebrile.  Her vital signs were stable.  Her abdomen was soft, but diffusely  tender.  Bowel sounds were present, but in a higher pitched obstructive  type pattern and tone.  She had focal left upper quadrant abdominal pain  with guarding and rebounding.  The patient was admitted with a diagnosis  of free air on CT due to perforated viscus in patient with known  diverticular disease.   HOSPITAL COURSE:  The patient was taken emergently from the ER to the OR  for exploratory laparotomy.  Prior to this procedure, risks and benefits  were discussed with the patient per Dr. Abbey Chatters and she agreed to  proceed.  She had been given Dilaudid for pain, Zofran for nausea and  had been empirically started on Zosyn IV preoperatively.  She had also  been begun on aggressive fluid  resuscitation at 200 mL/hour.   In the ER, the patient did undergo an exploratory laparotomy and was  found to have a foreign body of some type of food substance that had  perforated the jejunal diverticulum.  She did have a small bowel  resection to remove multiple areas of jejunal diverticulum and had a  primary anastomosis and tolerated this procedure well.   On postop day #1, the patient was having low grade fevers with a T-max  during the night of 102, but was doing much better.  Her Foley catheter  was discontinued and she was ambulated.  For the next several days, her  NG tube remained in place.  She was allowed ice chips and popsicles.  By  postop day #3, her NG tube was clamped.  She had some mild redness  without drainage at her incision and was continued on empiric Zosyn.  She was also noted to be having flatus.   By postop day #4, the patient's NG tube, which had been discontinued the  day before after tolerating  a clamping of 5-6 hours, was having positive  bowel sounds.  Her abdomen was soft and staples were intact.  There was  still an area of redness at the incision without drainage or induration.  She was started on clear liquids, continued on PCA and started on IV  Toradol and oxygen was continued.  By postop day #5, the patient was  having a large volume of flatus, tolerating full liquid breakfast.  Abdomen was stable with persistent redness without drainage or  induration.  Her PCA was discontinued.  She was changed over to Percocet  and plans were to start her on a solid diet by evening.   By postop day #6, the patient had tolerated a solid diet and oral pain  medicines.  Her abdomen was soft.  Staples were intact.  She had actual  decreased redness at the staple line with no drainage.  Pathology  returned without malignancy and she was deemed appropriate for discharge  home.  Her wound staples were discharge and Steri-Strips were applied  prior to discharge.    DISCHARGE DIAGNOSES:  1. Perforated small bowel/jejunal diverticulum, status post small      bowel resection of multiple jejunal ticks.  2. Attention-deficit disorder, stable.  3. Gastroesophageal reflux disease.   DISCHARGE MEDICATIONS:  1. Adderall XL 150 mg daily.  2. Nexium 40 mg daily,  3. Percocet 5/325 one to two tablets every 4 hours as needed for pain,      #40, dispense with no refills.   SPECIAL INSTRUCTIONS:  Return to work no sooner than 6 weeks from  surgery date following determination per Dr. Abbey Chatters.   DIET:  Low sodium, heart-healthy with diverticulitis precautions.   WOUND CARE:  Allow Steri-Strips to fall off.   ACTIVITY:  Increase activity slowly.  May walk up steps.  May shower.  No lifting greater than 15 pounds x6 weeks.  No driving while taking  Percocet.   FOLLOW UP:  You need to see Dr. Abbey Chatters in 2 weeks.  Call for  appointment.  The patient has received written instructions to take her  Disability/FMLA papers to our office for Dr. Maris Berger nurse to  complete.  She is to call the surgeon if a fever greater than or equal  to 101 degrees Fahrenheit, nausea, vomiting, diarrhea, redness or  drainage from the wound.      Allison L. Rennis Harding, N.P.      Thomas A. Cornett, M.D.  Electronically Signed    ALE/MEDQ  D:  03/19/2007  T:  03/20/2007  Job:  [id number]

## 2011-04-19 NOTE — Consult Note (Signed)
NAMEALAINE, LOUGHNEY             ACCOUNT NO.:  [id number]   MEDICAL RECORD NO.:  [id number]          PATIENT TYPE:  INP   LOCATION:  6742                         FACILITY:  [facility]   PHYSICIAN:  Barbette Hair. Arlyce Dice, MD,FACGDATE OF BIRTH:  October 26, 1956   DATE OF CONSULTATION:  07/17/2006  DATE OF DISCHARGE:                                   CONSULTATION   PROBLEM:  Abdominal pain.   Ms. Philson is well known to me with a history of diverticulitis and  hepatitis C.  She was admitted on July 16, 2006 with a two week history of  nausea, muscle aches, low-grade fevers, and diarrhea.  She has a history of  IBS.  Her stools are watery but without bleeding.  She complains of severe  right-sided abdominal pain that is intermittent.  She takes Interferon and  Ribavirin for her hepatitis C.  She has a history for an esophageal  stricture for which she has been dilated.   She is on no other medications.   PHYSICAL EXAMINATION:  GENERAL:  She is a well-developed and well-nourished  female.  VITAL SIGNS:  Stable.  She is anicteric.  HEENT:  Within normal limits.  CHEST:  Clear.  There are no cardiac murmurs, rubs or gallops.  ABDOMEN:  Without masses, tenderness, or organomegaly.  Abdomen is soft.   LABS:  White count 2.7, hemoglobin 11, hematocrit 32.  LFTs normal.   IMPRESSION:  1. Viral gastroenteritis:  Symptoms are unlike her previous      diverticulitis.  Leukopenia is certainly compatible with a viral      infection.  2. Irritable bowel syndrome.  3. Diarrhea:  This is likely related to #1 and #2.  4. History of hepatitis C:  Her therapy could be contributing to her      nausea.   RECOMMENDATIONS:  1. Hold antibiotics.  2. Proceed with CT of the abdomen and pelvis.      Barbette Hair. Arlyce Dice, MD,FACG  Electronically Signed     RDK/MEDQ  D:  07/17/2006  T:  07/17/2006  Job:  [id number]

## 2011-04-19 NOTE — H&P (Signed)
[name], [name]             ACCOUNT NO.:  [id number]   MEDICAL RECORD NO.:  [id number]          PATIENT TYPE:  INP   LOCATION:  6735                         FACILITY:  [facility]   PHYSICIAN:  Adolph Pollack, M.D.DATE OF BIRTH:  [birth year]   DATE OF ADMISSION:  01/30/2007  DATE OF DISCHARGE:                              HISTORY & PHYSICAL   CHIEF COMPLAINT:  Acute abdomen.   HISTORY OF PRESENT ILLNESS:  Ms. Dooner is a 55 year old female patient  with a known history of diverticular disease, as well as hepatitis C.  She has had several days of nagging, lower abdominal pain.  No fevers,  no chills, no myalgias.  No nausea, no vomiting.  Last night, between 7  and 8, she began developing more severe pain, mainly on the left side,  she was unable to sleep.  She finally presented to the ER today.  Work  up included a white count, which was elevated at 11,600 with neutrophils  85%.  CT was done that demonstrated free air and extravasation of CT  contrast in the left upper abdomen consistent with either small bowel or  large colon perforation, probably due to diverticular disease.  A  surgical evaluation has been requested.   REVIEW OF SYSTEMS:  As above.  No chest pain.  No shortness of breath.  No orthopnea.  No dyspnea on exertion.  Abdominal pain as noted in  history of present illness.  No bleeding.  No urinary symptoms.   SOCIAL HISTORY:  The patient denies tobacco or alcohol.   PAST MEDICAL HISTORY:  1. Attention deficit disorder.  2. Hepatitis C.  3. Known diverticular disease.   PAST SURGICAL HISTORY:  1. Exploratory laparotomy x2 due to ectopic pregnancy.  2. Some question as to whether the patient has had a hysterectomy.   PHYSICAL EXAMINATION:  GENERAL:  Anxious female patient, crying,  complaining of severe left-sided abdominal pain since last p.m.  VITAL SIGNS:  Temp 97.6.  BP 134/85.  Pulse 91 and regular.  Respirations 20.  NEURO:  Patient is alert and  oriented.  She is moving all extremities  x4.  No focal deficits.  HEENT:  Head normocephalic.  Sclerae not injected.  NECK:  Supple.  No adenopathy.  CHEST:  Bilateral lung fields are clear to auscultation.  Respiratory  effort is nonlabored.  She is on nasal cannula O2.  CARDIAC:  S1, S2 without rubs, murmurs, thrills or gallops.  Pulse is  regular.  No tachycardia.  ABDOMEN:  Soft, but diffusely tender.  Bowel sounds are present in a  higher pitch obstructive tone and pattern.  She has focal left upper  quadrant abdominal pain with guarding and rebounding.  EXTREMITIES:  Are symmetrical in appearance without edema, cyanosis or  clubbing.   LABS:  Sodium 135, potassium 4.0, CO2 24, BUN 11, creatinine 0.9, white  count 11,600 with a left shift, neutrophils 35%, hemoglobin 14.0.   DIAGNOSTICS:  CT of the abdomen and pelvis as described.   IMPRESSION:  1. Known diverticular disease.  2. Acute abdomen with perforated disc as seen on CT.  PLAN:  1. Patient will be admitted to the hospital and taken to the operating      room today for exploratory laparotomy.  I have explained the      procedure and risks to her.  These include but are not limited to      bleeding, infection, wound problems, anesthesia, injury to      intraabdominal organs, bowel resection with possible colostomy.      She seems to understand and agrees to proceed.  2. We will begin Dilaudid for pain, Zofran for nausea and empiric      antibiotic coverage with Zosyn IV.  3. We will also begin aggressive fluid resuscitation at fluid 200 an      hour.  4. Preoperative portable chest x-ray, EKG,and PT/PTT.      Allison L. Rennis Harding, N.P.      Adolph Pollack, M.D.  Electronically Signed    ALE/MEDQ  D:  01/30/2007  T:  01/31/2007  Job:  [id number]

## 2011-04-19 NOTE — Discharge Summary (Signed)
[name], [name]             ACCOUNT NO.:  [id number]   MEDICAL RECORD NO.:  [id number]          PATIENT TYPE:  INP   LOCATION:  0456                         FACILITY:  [facility]   PHYSICIAN:  Theone Stanley, MD   DATE OF BIRTH:  1956-06-17   DATE OF ADMISSION:  11/15/2004  DATE OF DISCHARGE:  11/21/2004                                 DISCHARGE SUMMARY   ADMITTING DIAGNOSES:  1.  Diverticulitis of the sigmoid colon.  2.  History of hysterectomy.  3.  History of cholecystectomy.  4.  Ectopic pregnancy x 2.  5.  Rectal abscess.  6.  Cystocele and rectocele.   DISCHARGE DIAGNOSES:  1.  Diverticulitis of the sigmoid colon.  2.  History of hysterectomy.  3.  History of cholecystectomy.  4.  Ectopic pregnancy x 2.  5.  Rectal abscess.  6.  Cystocele and rectocele.   PROCEDURES/DIAGNOSTIC TESTS:  On November 19, 2004, the patient underwent an  EGD by Dr. Arlyce Dice, subsequently was found to have esophageal inflammation,  length about 1 cm in the distal esophagus.  The patient did have a stricture  about 40 cm from the mouth which was dilated with 15, 16, and 18 mm Savary  dilator.  The patient had a hiatal hernia about 3 cm in length.  The patient  had a CT scan of the abdomen and pelvis on the 15th which showed acute  sigmoid diverticulitis, suspect a perforation along the  sigmoid colon with  small focal extraluminal gas.  A repeat CT scan was performed on the 18th  which showed significant improvement in the appearance of sigmoid colon  since prior study.   HOSPITAL COURSE:  After the patient was admitted, the patient was started on  IV Cipro and Flagyl.  She slowly improved during her time.  GI was consulted  on the 19th after patient was complaining of esophageal pain and dysphagia.  It was felt that she would benefit from EGD.  EGD was performed on the 19th  which showed a stricture.  This was dilated.  The patient tolerated the  procedure well and had no complications  postprocedure.  The patient  continued to improve.  Her antibiotics were switched to p.o. on the 20th.  Her pain medications were switched to p.o., and she told this quite well.  The patient was stable, afebrile.  Therefore, she was discharged on the  21st.   DISCHARGE MEDICATIONS:  1.  Ciprofloxacin 500 mg 1 p.o. b.i.d. for 9 days.  2.  Flagyl 1 p.o. t.i.d. x 9 days.  3.  Protonix or Prilosec daily.  4.  Oxycodone 1-2 tabs p.o. q.4h. p.r.n. for pain.  5.  Phenergan 25 mg 1 p.o. q.6h. p.r.n. for nausea.   FOLLOW UP:  The patient is to follow up with Dr. Arlyce Dice in 4-6 weeks.  Phone  number [phone].  The patient is instructed to obtain a primary care  physician.  Numbers were provided for the patient.     Atta   AEJ/MEDQ  D:  11/21/2004  T:  11/21/2004  Job:  [id number]  cc:   Barbette Hair. Arlyce Dice, M.D. [facility]

## 2011-04-19 NOTE — Assessment & Plan Note (Signed)
[facility]                           GASTROENTEROLOGY OFFICE NOTE   NAME:[name], Michelle ALWINE                    MRN:          [id number]  DATE:08/22/2006                            DOB:          1956/06/05    PROBLEM:  Dysphagia.   REASON FOR VISIT:  Ms. Hasten has returned complaining of dysphagia to  solids.  She has a history of an esophageal stricture.  She was last dilated  in July 2006.  She takes Nexium daily.  She has no pyrosis.  She was  recently evaluated for abdominal pain.  She feels that her pain and mood  swings are attributed to her Interferon therapy.  She has recently been  feeling quite a bit improved.   PHYSICAL EXAMINATION:  VITAL SIGNS:  Pulse 68, blood pressure 130/80, weight  153.   IMPRESSION:  1. Recurrent esophageal stricture.  2. Hepatitis C (the patient reports that her virus has been eradicated).   RECOMMENDATIONS:  1. Continue on Nexium.  2. Upper endoscopy with Savary dilatation as indicated.                                   Barbette Hair. Arlyce Dice, MD,FACG   RDK/MedQ  DD:  08/22/2006  DT:  08/25/2006  Job #:  [id number]   cc:   Nilda Simmer, M.D.  [facility]

## 2011-04-19 NOTE — H&P (Signed)
NAMEANALISIA, KINGSFORD NO.:  [id number]   MEDICAL RECORD NO.:  [id number]          PATIENT TYPE:  EMS   LOCATION:  ED                           FACILITY:  [facility]   PHYSICIAN:  Sherin Quarry, MD      DATE OF BIRTH:  February 22, 1956   DATE OF ADMISSION:  11/15/2004  DATE OF DISCHARGE:                                HISTORY & PHYSICAL   HISTORY OF PRESENT ILLNESS:  [name] is a 55 year old lady who says  that she has no primary doctor.  She presents to [facility] on November 15, 2004 with a history of onset of abdominal  pain at 4 p.m. yesterday, described as sudden in onset, doubling her over.  This was associated with one episode of chills.  The patient indicates that  she has been anorexic since that time and has felt slightly queasy.  She  describes her bowel function as normal but indicates that she has had some  pain with defecation.  There has been no melena or hematochezia.  She has  had no vomiting.   On presentation to the emergency room, laboratory studies included a white  count of 12,500.  A urinalysis was unremarkable.  The patient was sent for a  CT scan of the abdomen and pelvis which was interpreted as showing evidence  of sigmoid diverticulitis with possibly early abscess formation.  The  patient indicates that she has not had a previous sigmoidoscopy or  colonoscopy.  She is admitted at this time for further evaluation and  treatment.   MEDICATIONS:  None.   ALLERGIES:  No known drug allergies.   PAST SURGICAL HISTORY:  The patient has had numerous operative procedures  and is somewhat unclear about the exact nature of these.  It is my  impression that she has had two previous left knee surgeries.  Approximately  six months ago, she underwent removal of one or possibly two ovaries because  of ovarian cyst.  Three months ago, she underwent a cholecystectomy at  [facility].  She also indicates that she  has had two  procedures done for ectopic pregnancies, which probably involved tubal  ligation.  She also has had four other surgeries done for which she says  were ovarian cyst.  At some point during these procedures, had a  hysterectomy.  She has had an operation for a urethral cyst which involves  some type of suspension procedure in her bladder.  She says that she has  also had five operations for rectal abscess and that she has a rectocele.  These are all the procedures that she can recall.   PAST MEDICAL HISTORY:  She indicates that she has never been hospitalized  for any medical illnesses.  She specifically denies any history of heart  disease, lung disease, kidney disease, or diabetes.   FAMILY HISTORY:  The patient has a brother and sister who are in good  health.  Her father died of a MI.  Her mother has a history of chronic  gastrointestinal problems and has had a cholecystectomy.  SOCIAL HISTORY:  The patient is currently unemployed.  She is living with  her daughter.  She indicates she will very occasionally drink one beer.  She  denies cigarette smoking.  She does not use drugs.   REVIEW OF SYMPTOMS:  HEENT:  Head:  She denies headache or dizziness.  Eyes:  She denies visual blurring or diplopia.  Ears/nose/throat:  Denies earache,  sinus pain, or sore throat.  CHEST:  Denies coughing, wheezing, or chest  congestion.  CARDIOVASCULAR:  Denies orthopnea, PND, or ankle edema.  GASTROINTESTINAL:  See above.  GENITOURINARY:  She will occasionally have  mild dysuria.  There has been no hematuria.  NEUROLOGICAL:  No history of  seizure or stroke.  ENDOCRINE:  She denies excessive thirst, urinary  frequency, or nocturia.   PHYSICAL EXAMINATION:  GENERAL:  She is alert, cooperative woman who is  somewhat anxious.  VITAL SIGNS:  Temperature is 98.8, blood pressure 144/77, pulse is 90,  respirations 20.  HEENT:  Within normal limits.  CHEST:  Clear to auscultation and  percussion.  BACK:  Reveals no CVA or point tenderness.  CARDIOVASCULAR:  Reveals normal S1 and S2.  There are no murmurs, gallops,  or rubs.  ABDOMEN:  The patient has normal bowel sounds.  There is no tenderness to  palpation in the upper quadrants.  The examination of the left lower  quadrant reveals mild tenderness without guarding or rebound.  There is no  mass appreciated.  NEUROLOGICAL:  Within normal limits.  EXTREMITIES:  No evidence of cyanosis or edema.   IMPRESSION:  1.  Diverticulitis on the basis of CT scan.  2.  History of total abdominal hysterectomy/bilateral salpingo-oophorectomy,      ovarian cyst.  3.  History of cholecystectomy.  4.  History of ectopic pregnancy x 2.  5.  History of rectal abscess.  6.  History of urethral cyst.  7.  History of cystocele and rectocele.   PLAN:  My plan will be to manage this patient conservatively for the time  being.  We will make her NPO.  We will give her intravenous fluids.  We will  give her empiric therapy with ciprofloxacin and Flagyl for management of the  diverticulitis.  We will observe her response to therapy.  It may be  necessary to obtain follow-up x-ray studies.  Pain medicines will be given  as needed.      SY/MEDQ  D:  11/15/2004  T:  11/15/2004  Job:  [id number]

## 2011-04-19 NOTE — Op Note (Signed)
[name], CRESON             ACCOUNT NO.:  [id number]   MEDICAL RECORD NO.:  [id number]          PATIENT TYPE:  INP   LOCATION:  6735                         FACILITY:  [facility]   PHYSICIAN:  Adolph Pollack, M.D.DATE OF BIRTH:  04/30/1956   DATE OF PROCEDURE:  01/30/2007  DATE OF DISCHARGE:                               OPERATIVE REPORT   PREOPERATIVE DIAGNOSIS:  Perforated viscus.   POSTOPERATIVE DIAGNOSIS:  Perforated jejunal diverticulum.   PROCEDURE:  Exploratory laparotomy and small-bowel resection.   SURGEON:  Adolph Pollack, M.D.   FIRST ASSISTANT:  Anselm Pancoast. Zachery Dakins, M.D.   SECOND ASSISTANT:  Kelle Darting. Rennis Harding, nurse practitioner.   ANESTHESIA:  General.   INDICATIONS:  This is a 55 year old female who presented to the  emergency department with increasing abdominal pain.  She had  peritonitis on exam and a CT was consistent with viscus perforation.  She is now brought to the operating room for emergency exploratory  laparotomy.  Details of the procedure and the risks including, but not  limited to, bleeding, infection, wound-healing problems, anesthesia,  accidental damage to intra-abdominal organs, and possible colostomy were  explained to her.   TECHNIQUE:  She was brought to the operating room and placed supine on  the operating table and a general anesthetic was administered.  The  abdominal wall was sterilely prepped and draped.  A midline incision was  made beginning above the umbilicus and extending below it, dividing the  skin, subcutaneous tissue, midline fascia and peritoneum.  Omental  adhesions were noted at the anterior abdominal wall and these were taken  down with electrocautery.  The abnormality on CT scan appeared be in the  left side.  I eviscerated the small bowel and in the proximal to mid  jejunum, I noted a diverticulum with a green foreign body sticking  through it consistent with site of perforation.  In order to avoid  further  contamination, I removed the foreign body and then did a  diverticulectomy with the GIA stapler.  I noted that she had a number of  diverticula in this area only, as I ran the entire small bowel.  I  inspected the colon.  She had diverticular disease there, but no acute  inflammatory changes in the left and sigmoid side or the rest of the  colon.  I decided to go ahead and perform a small bowel resection of the  limited area of the jejunum where all the diverticula were.   Proximal and distal to the area diverticula, I divided the small  intestine with the GIA stapler.  The mesentery was divided between  clamps and the vessels ligated.  I then noticed one other area where  there was a smaller diverticulum just a little bit away from the distal  staple line and I went ahead and resected the segment of small bowel as  well.  These were all sent to Pathology for permanent section.   Following this, a side-to-side stapled anastomosis was performed with a  gastrointestinal stapler.  The common defect was closed with a linear  non-cutting stapler.  The  crotch was reinforced with a suture, as was  the proximal area of the staple line where the closure of the common  defect occurred.  I then reapproximated the mesentery with interrupted 3-  0 Vicryl sutures.   The anastomosis was inspected; it was patent and viable and under no  tension.  I then copiously irrigated out the abdominal cavity and the  fluid returned clear.  I requested sponge count, which was reported to  be correct.  I then placed the small bowel back into abdominal cavity  and placed the omentum over it.  The fascia was closed with a running #1  PDS suture.  The subcutaneous tissue was irrigated and skin was closed  with staples.  All needle, sponge and instrument counts were reported to  be correct before final closure.   She tolerated the procedure well without any apparent complications and  was taken to the recovery in  satisfactory condition.      Adolph Pollack, M.D.  Electronically Signed     TJR/MEDQ  D:  01/30/2007  T:  01/31/2007  Job:  [id number]

## 2011-04-19 NOTE — H&P (Signed)
NAMEVONYA, OHALLORAN             ACCOUNT NO.:  [id number]   MEDICAL RECORD NO.:  [id number]          PATIENT TYPE:  INP   LOCATION:  1827                         FACILITY:  [facility]   PHYSICIAN:  Wayne A. Sheffield Slider, M.D.    DATE OF BIRTH:  1956-10-12   DATE OF ADMISSION:  07/17/2006  DATE OF DISCHARGE:                                HISTORY & PHYSICAL   CHIEF COMPLAINT:  Flu-like symptoms with belly pain and confusion.   HISTORY OF PRESENT ILLNESS:  The patient is a 55 year old female with a  history of diverticulitis and with a 1-day history of flu-like symptoms.  She complains of nausea without vomiting, diffuse muscle aches, fevers with  sweats and diarrhea that started 2 weeks ago and was yellow and watery.  She  also says she has been very confused for the past 24 hours with a headache,  weakness and fatigue.  At the right side, her abdomen aches like a man  punched me.  The pain comes and goes and gets to a 10/10 (worse than  childbirth).  The patient has had decreased appetite today and pain does not  change with cough.   REVIEW OF SYSTEMS:  Positive for blurry vision since she has been on  interferon, positive for recent blisters in mouth and neck pain with these  that she attributes as side-effects of interferon.   PAST MEDICAL HISTORY:  1. Hepatitis C, on interferon with 2 months left of treatment.  2. Diverticulitis of sigmoid colon with a questionable perforation.  3. History of hysterectomy.  4. History of esophageal inflammation with stricture, status post dilation      by Dr. Arlyce Dice in 2005.  5. History of rectal abscesses.  6. History of rectocele and cystocele.  7. Ectopic pregnancy x2.  8. History of cholecystectomy.   MEDICATIONS:  1. Ribavirin.  2. Interferon alpha-2b every Sunday.   ALLERGIES:  None.   SOCIAL HISTORY:  The patient lives with daughter Lanice Schwab.  No tobacco, no  alcohol or drugs.  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   FAMILY  HISTORY:  Father died of an MI, also had pancreatitis.  Mom with  chronic GI problems.   PHYSICAL EXAMINATION:  VITAL SIGNS:  Temperature 97.1, pulse 89, blood  pressure 113/72, respirations 20, 97% on room air.  GENERAL:  The patient appeared uncomfortable, curling up in pain, alert and  oriented x3.  HEENT:  Oropharynx was clear.  No lymphadenopathy.  NECK:  No cervical lymphadenopathy.  No thyromegaly.  CARDIOVASCULAR:  Regular rate and rhythm.  No murmurs.  LUNGS:  Clear to auscultation bilaterally.  ABDOMEN:  Soft, tender to palpation on the left side with no rebound or  guarding.  Positive bowel sounds.  RECTAL:  No stool in vault.  Heme-negative.  EXTREMITIES:  No clubbing, cyanosis, or edema.  NEUROLOGIC:  CN II-XII intact.  Strength 5/5, upper and lower extremities.  Cerebellar exam intact.  Normal gait.   LABORATORY DATA:  White blood cell count 3.5, H&H 11.4/32.4, platelets  187,000, ANC 2.1.  UA with specific gravity of 1.018 with trace LE,  3-6  white blood cells and many epithelials.  LFTs within normal limits, except  for ammonia, which is 56.  Sodium 140, potassium 4, chloride 109, bicarb 24,  BUN 17, creatinine 0.7, glucose 93, lipase 24.  PCO2 38, pH 7.409.  Fecal  occult blood negative.   ASSESSMENT AND PLAN:  This is a 55 year old female with:  1. Flu-like symptoms, nausea and diarrhea.  Differential includes      diverticular disease with no white count elevation and pain is right-      sided, which would be unusual.  The patient could have appendicitis,      but no rebound, no white blood cell count elevation and no fever.  We      will check a KUB and consider a CT scan with contrast, but the patient      has had multiple CT scans recently, the last actually being an MRI on      June 05, 2006, which was completely normal.  We will get blood cultures      x2 and repeat CBC in the morning.  Check for Clostridium difficile.      Given history, this may be  diverticular disease and tempted to start      Cipro and Flagyl, but without fevers and white blood cell count      elevation, I will hold off.  Consider Gastroenterology consult in the      morning if not improving.  This also may be gastroenteritis, especially      with the flu-like symptoms reflective of a virus.  Phenergan for      nausea.  2. Hepatitis C:  Continue ribavirin 400 mg q.a.m., 600 mg q.p.m., as the      patient is less than 75 kg.  She does not remember her exact dose, but      looking up __________ , this would be correct for her weight.  The      patient says she is on interferon alpha-2b.  I consulted Pharmacy,      because usually this is a thrice-a-week medicine, but Pharmacy says      that there is a pegylated version that you can get once a week.  She      takes it every Sunday.  3. Confusion may be secondary to problem #1, but also could be secondary      to the elevated ammonia level.  The patient has normal LFTs and a      recent MRI scan that shows no cirrhosis.  It would be unusual for the      patient to have an elevated ammonia level with such healthy-looking      liver.  This could also be due to congestive heart failure, which the      patient does not have, kidney disease, which the patient does not have,      and severe bleeding can cause this, but the patient had a normal BUN      and a stable hemoglobin.  Patient already with diarrhea, but we will      discuss lactulose with team.  4. Prophylaxis:  Protonix and sequential compression devices.      Rolm Gala, M.D.    ______________________________  Arnette Norris. Sheffield Slider, M.D.    Bennetta Laos  D:  07/17/2006  T:  07/17/2006  Job:  [id number]

## 2011-08-28 LAB — POCT I-STAT, CHEM 8
BUN: 21
Creatinine, Ser: 0.9
Potassium: 3.8
Sodium: 139
TCO2: 25

## 2011-08-28 LAB — DIFFERENTIAL
Basophils Absolute: 0
Basophils Relative: 0
Eosinophils Absolute: 0.1
Neutro Abs: 4.8
Neutrophils Relative %: 70

## 2011-08-28 LAB — CBC
HCT: 39.5
Hemoglobin: 13.5
MCHC: 34.2
MCV: 88.9
Platelets: 216
RBC: 4.44
RDW: 12.7
WBC: 6.9

## 2011-08-28 LAB — URINALYSIS, ROUTINE W REFLEX MICROSCOPIC
Bilirubin Urine: NEGATIVE
Hgb urine dipstick: NEGATIVE
Ketones, ur: NEGATIVE
Nitrite: NEGATIVE
Protein, ur: NEGATIVE
Specific Gravity, Urine: 1.019
Urobilinogen, UA: 0.2

## 2011-08-28 LAB — HEPATIC FUNCTION PANEL
ALT: 18
AST: 21
Albumin: 3.8
Alkaline Phosphatase: 33 — ABNORMAL LOW
Bilirubin, Direct: 0.1
Indirect Bilirubin: 0.7
Total Bilirubin: 0.8
Total Protein: 6.3

## 2011-08-28 LAB — LIPASE, BLOOD: Lipase: 25

## 2012-02-29 ENCOUNTER — Emergency Department (HOSPITAL_COMMUNITY): Payer: BC Managed Care – PPO

## 2012-02-29 ENCOUNTER — Emergency Department (HOSPITAL_COMMUNITY)
Admission: EM | Admit: 2012-02-29 | Discharge: 2012-02-29 | Disposition: A | Discharge status: Home / Self Care | Payer: BC Managed Care – PPO | Attending: Emergency Medicine | Admitting: Emergency Medicine

## 2012-02-29 ENCOUNTER — Encounter (HOSPITAL_COMMUNITY): Payer: Self-pay

## 2012-02-29 DIAGNOSIS — J329 Chronic sinusitis, unspecified: Secondary | ICD-10-CM

## 2012-02-29 HISTORY — DX: Unspecified viral hepatitis C without hepatic coma: B19.20

## 2012-02-29 LAB — CBC
HCT: 42.8 % (ref 36.0–46.0)
Hemoglobin: 15.1 g/dL — ABNORMAL HIGH (ref 12.0–15.0)
MCH: 30.3 pg (ref 26.0–34.0)
MCHC: 35.3 g/dL (ref 30.0–36.0)
RDW: 12.6 % (ref 11.5–15.5)

## 2012-02-29 LAB — URINALYSIS, ROUTINE W REFLEX MICROSCOPIC
Glucose, UA: NEGATIVE mg/dL
Hgb urine dipstick: NEGATIVE
Leukocytes, UA: NEGATIVE
Specific Gravity, Urine: 1.025 (ref 1.005–1.030)
pH: 6 (ref 5.0–8.0)

## 2012-02-29 LAB — DIFFERENTIAL
Basophils Absolute: 0 10*3/uL (ref 0.0–0.1)
Basophils Relative: 0 % (ref 0–1)
Eosinophils Absolute: 0.1 10*3/uL (ref 0.0–0.7)
Monocytes Absolute: 0.6 10*3/uL (ref 0.1–1.0)
Monocytes Relative: 6 % (ref 3–12)
Neutro Abs: 7 10*3/uL (ref 1.7–7.7)
Neutrophils Relative %: 76 % (ref 43–77)

## 2012-02-29 LAB — COMPREHENSIVE METABOLIC PANEL
AST: 15 U/L (ref 0–37)
Albumin: 4.2 g/dL (ref 3.5–5.2)
BUN: 15 mg/dL (ref 6–23)
Creatinine, Ser: 0.52 mg/dL (ref 0.50–1.10)
Total Protein: 7.6 g/dL (ref 6.0–8.3)

## 2012-02-29 MED ORDER — METOCLOPRAMIDE HCL 5 MG/ML IJ SOLN
10.0000 mg | Freq: Once | INTRAMUSCULAR | Status: AC
  Administered 2012-02-29: 10 mg via INTRAVENOUS
  Filled 2012-02-29: qty 2

## 2012-02-29 MED ORDER — SODIUM CHLORIDE 0.9 % IV BOLUS (SEPSIS)
1000.0000 mL | Freq: Once | INTRAVENOUS | Status: AC
  Administered 2012-02-29: 1000 mL via INTRAVENOUS

## 2012-02-29 MED ORDER — ONDANSETRON HCL 4 MG/2ML IJ SOLN
4.0000 mg | Freq: Once | INTRAMUSCULAR | Status: AC
  Administered 2012-02-29: 4 mg via INTRAVENOUS
  Filled 2012-02-29: qty 2

## 2012-02-29 MED ORDER — SODIUM CHLORIDE 0.9 % IV SOLN
Freq: Once | INTRAVENOUS | Status: AC
  Administered 2012-02-29: 500 mL/h via INTRAVENOUS

## 2012-02-29 MED ORDER — LORAZEPAM 2 MG/ML IJ SOLN
1.0000 mg | Freq: Once | INTRAMUSCULAR | Status: AC
  Administered 2012-02-29: 1 mg via INTRAVENOUS
  Filled 2012-02-29: qty 1

## 2012-02-29 MED ORDER — HYDROMORPHONE HCL PF 1 MG/ML IJ SOLN
1.0000 mg | Freq: Once | INTRAMUSCULAR | Status: AC
  Administered 2012-02-29: 1 mg via INTRAVENOUS
  Filled 2012-02-29: qty 1

## 2012-02-29 MED ORDER — OXYCODONE-ACETAMINOPHEN 5-325 MG PO TABS: 2.0000 | ORAL_TABLET | ORAL | Status: AC | PRN

## 2012-02-29 MED ORDER — DIPHENHYDRAMINE HCL 50 MG/ML IJ SOLN
12.5000 mg | Freq: Once | INTRAMUSCULAR | Status: AC
  Administered 2012-02-29: 12.5 mg via INTRAVENOUS
  Filled 2012-02-29: qty 1

## 2012-02-29 MED ORDER — MORPHINE SULFATE 4 MG/ML IJ SOLN
4.0000 mg | Freq: Once | INTRAMUSCULAR | Status: AC
Start: 2012-02-29 — End: 2012-02-29
  Administered 2012-02-29: 4 mg via INTRAVENOUS
  Filled 2012-02-29: qty 1

## 2012-02-29 MED ORDER — AMOXICILLIN-POT CLAVULANATE 875-125 MG PO TABS: 1.0000 | ORAL_TABLET | Freq: Two times a day (BID) | ORAL | Status: AC

## 2012-02-29 NOTE — ED Provider Notes (Addendum)
History     CSN: [id number]  Arrival date & time 02/29/12  0918   First MD Initiated Contact with Patient 02/29/12 [phone]      Chief Complaint  Patient presents with  . Headache    (Consider location/radiation/quality/duration/timing/severity/associated sxs/prior treatment) Patient is a 56 y.o. female presenting with headaches. The history is provided by the patient.  Headache    patient presents with headache associated with vomiting x1 week. Abdominal cramping as well 2. Vomiting has been bilious. Denies any black or bloody stools. Headache is diffuse and frontal region and not associated with neck pain, blurred vision, photophobia. No prior history of chronic headaches. No medications taken for this prior to arrival. Nothing makes her symptoms better or worse  Past Medical History  Diagnosis Date  . Hepatitis C     Past Surgical History  Procedure Date  . Cholecystectomy   . Colon surgery   . Appendectomy   . Abdominal surgery   . Abdominal hysterectomy   . Hernia repair     No family history on file.  History  Substance Use Topics  . Smoking status: Never Smoker   . Smokeless tobacco: Not on file  . Alcohol Use: Yes    OB History    Grav Para Term Preterm Abortions TAB SAB Ect Mult Living                  Review of Systems  Neurological: Positive for headaches.  All other systems reviewed and are negative.    Allergies  Review of patient's allergies indicates no known allergies.  Home Medications   Current Outpatient Rx  Name Route Sig Dispense Refill  . NYQUIL PO Oral Take 1 capsule by mouth. For congestion and aching      BP 133/90  Pulse 92  Temp(Src) 98.3 F (36.8 C) (Oral)  Resp 20  SpO2 100%  Physical Exam  Nursing note and vitals reviewed. Constitutional: She is oriented to person, place, and time. Vital signs are normal. She appears well-developed and well-nourished.  Non-toxic appearance. No distress.  HENT:  Head: Normocephalic  and atraumatic.  Eyes: Conjunctivae, EOM and lids are normal. Pupils are equal, round, and reactive to light.  Neck: Normal range of motion. Neck supple. No tracheal deviation present. No mass present.  Cardiovascular: Normal rate, regular rhythm and normal heart sounds.  Exam reveals no gallop.   No murmur heard. Pulmonary/Chest: Effort normal and breath sounds normal. No stridor. No respiratory distress. She has no decreased breath sounds. She has no wheezes. She has no rhonchi. She has no rales.  Abdominal: Soft. Normal appearance and bowel sounds are normal. She exhibits no distension. There is no tenderness. There is no rebound and no CVA tenderness.  Musculoskeletal: Normal range of motion. She exhibits no edema and no tenderness.  Neurological: She is alert and oriented to person, place, and time. She has normal strength. No cranial nerve deficit or sensory deficit. GCS eye subscore is 4. GCS verbal subscore is 5. GCS motor subscore is 6.  Skin: Skin is warm and dry. No abrasion and no rash noted.  Psychiatric: Her speech is normal and behavior is normal. Her mood appears anxious.    ED Course  Procedures (including critical care time)   Labs Reviewed  CBC  DIFFERENTIAL  COMPREHENSIVE METABOLIC PANEL  URINALYSIS, ROUTINE W REFLEX MICROSCOPIC  URINE CULTURE  LIPASE, BLOOD   No results found.   No diagnosis found.    MDM  Patient  given IV fluids and pain medication. CT results of her head reviewed and show severe sinusitis. She'll be placed on pain medication and antibiotics and discharged home. No concern for subarachnoid hemorrhage or meningitis        Toy Baker, MD 02/29/12 1400  Toy Baker, MD 02/29/12 1400

## 2012-02-29 NOTE — ED Notes (Signed)
Pt. Has had a headache all week and began vomiting green this am.  Pt. Has had hx of abdominal surgeries.

## 2012-02-29 NOTE — Discharge Instructions (Signed)
Sinusitis Sinuses are air pockets within the bones of your face. The growth of bacteria within a sinus leads to infection. The infection prevents the sinuses from draining. This infection is called sinusitis. SYMPTOMS  There will be different areas of pain depending on which sinuses have become infected.  The maxillary sinuses often produce pain beneath the eyes.   Frontal sinusitis may cause pain in the middle of the forehead and above the eyes.  Other problems (symptoms) include:  Toothaches.   Colored, pus-like (purulent) drainage from the nose.   Swelling, warmth, and tenderness over the sinus areas may be signs of infection.  TREATMENT  Sinusitis is most often determined by an exam.X-rays may be taken. If x-rays have been taken, make sure you obtain your results or find out how you are to obtain them. Your caregiver may give you medications (antibiotics). These are medications that will help kill the bacteria causing the infection. You may also be given a medication (decongestant) that helps to reduce sinus swelling.  HOME CARE INSTRUCTIONS   Only take over-the-counter or prescription medicines for pain, discomfort, or fever as directed by your caregiver.   Drink extra fluids. Fluids help thin the mucus so your sinuses can drain more easily.   Applying either moist heat or ice packs to the sinus areas may help relieve discomfort.   Use saline nasal sprays to help moisten your sinuses. The sprays can be found at your local drugstore.  SEEK IMMEDIATE MEDICAL CARE IF:  You have a fever.   You have increasing pain, severe headaches, or toothache.   You have nausea, vomiting, or drowsiness.   You develop unusual swelling around the face or trouble seeing.  MAKE SURE YOU:   Understand these instructions.   Will watch your condition.   Will get help right away if you are not doing well or get worse.  Document Released: 11/18/2005 Document Revised: 11/07/2011 Document Reviewed:  06/17/2007 [facility] Patient Information 2012 [place], [place].

## 2012-03-01 LAB — URINE CULTURE
Colony Count: NO GROWTH
Culture  Setup Time: 201303301326
Culture: NO GROWTH

## 2012-04-17 ENCOUNTER — Telehealth (INDEPENDENT_AMBULATORY_CARE_PROVIDER_SITE_OTHER): Payer: Self-pay | Admitting: General Surgery

## 2012-04-17 NOTE — Telephone Encounter (Signed)
Pt calling to report hernia problems developing.  She had surgery with TR and he did not use mesh at the time because of potential for infection.  She has been okay until recently, but now is beginning to bulge and is painful.  Made appt for 05/20/12 and advise her to obtain and wear a binder for support as needed.  She will call back is worsens.

## 2012-05-17 ENCOUNTER — Ambulatory Visit: Payer: BC Managed Care – PPO

## 2012-05-17 ENCOUNTER — Ambulatory Visit (INDEPENDENT_AMBULATORY_CARE_PROVIDER_SITE_OTHER): Payer: BC Managed Care – PPO | Admitting: Family Medicine

## 2012-05-17 VITALS — BP 153/67 | HR 86 | Temp 98.4°F | Resp 16 | Ht 66.0 in | Wt 176.0 lb

## 2012-05-17 DIAGNOSIS — M25579 Pain in unspecified ankle and joints of unspecified foot: Secondary | ICD-10-CM

## 2012-05-17 DIAGNOSIS — M25569 Pain in unspecified knee: Secondary | ICD-10-CM

## 2012-05-17 DIAGNOSIS — F411 Generalized anxiety disorder: Secondary | ICD-10-CM

## 2012-05-17 DIAGNOSIS — K469 Unspecified abdominal hernia without obstruction or gangrene: Secondary | ICD-10-CM

## 2012-05-17 DIAGNOSIS — M542 Cervicalgia: Secondary | ICD-10-CM

## 2012-05-17 DIAGNOSIS — F419 Anxiety disorder, unspecified: Secondary | ICD-10-CM

## 2012-05-17 MED ORDER — CYCLOBENZAPRINE HCL 10 MG PO TABS: 10.0000 mg | ORAL_TABLET | Freq: Two times a day (BID) | ORAL | Status: AC | PRN

## 2012-05-17 MED ORDER — CLONAZEPAM 0.5 MG PO TABS: 0.2500 mg | ORAL_TABLET | Freq: Two times a day (BID) | ORAL | Status: DC | PRN

## 2012-05-17 MED ORDER — TRAMADOL HCL 50 MG PO TABS: 50.0000 mg | ORAL_TABLET | Freq: Three times a day (TID) | ORAL | Status: AC | PRN

## 2012-05-17 NOTE — Progress Notes (Signed)
Patient Name: [name] Date of Birth: [birth year] Medical Record Number: [id number] Gender: female Date of Encounter: 05/17/2012  History of Present Illness:  [name] is a 56 y.o. very pleasant female patient who presents with the following:  Last visit here about 7 months ago.   She is having left ankle pain for a couple of weeks.  It is stiff and can be swollen.  There is no known injury.  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  She has not had problems with this ankle in the past.    She also has an abdominal hernia which she has noted for 3 weeks.  It has been repaired in the past, but it "popped" again 3 weeks ago.  She does not noted severe pain, but has discomfort.  She notes that sometimes "after I eat it will come back up."  She has an appt coming up with her surgeon Dr. Purnell Shoemaker.    She has a history of partial colectomy due to diverticulitis and ?interferon allergy.  This surgery was done about 2 years ago and had been doing well for some time.   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  She has had this problem in the past, but it has been worse for a few days now.  It was suggested that she see a neurologist in the past.    Admits to a lot of anxiety, and she does not use any medication for this.  She is worried about taking medications due to her history of adverse reaction to interferon.      Patient Active Problem List  Diagnosis  . OTHER ANXIETY STATES  . ADD  . HEMORRHAGE OF RECTUM AND ANUS  . ABDOMINAL PAIN, EPIGASTRIC  . ABDOMINAL PAIN-MULTIPLE SITES  . DIVERTICULITIS, HX OF   Past Medical History  Diagnosis Date  . Hepatitis C    Past Surgical History  Procedure Date  . Cholecystectomy   . Colon surgery   . Appendectomy   . Abdominal surgery   . Abdominal hysterectomy   . Hernia repair    History  Substance Use Topics  . Smoking status: Never Smoker    . Smokeless tobacco: Not on file  . Alcohol Use: Yes   No family history on file. No Known Allergies  Medication list has been reviewed and updated.  Prior to Admission medications   Medication Sig Start Date End Date Taking? Authorizing Provider  Pseudoeph-Doxylamine-DM-APAP (NYQUIL PO) Take 1 capsule by mouth. For congestion and aching    Historical Provider, MD    Review of Systems:  As per HPI- otherwise negative.   Physical Examination: Filed Vitals:   05/17/12 0824  BP: 153/67  Pulse: 86  Temp: 98.4 F (36.9 C)  Resp: 16   Filed Vitals:   05/17/12 0824  Height: 5\' 6"  (1.676 m)  Weight: 176 lb (79.833 kg)   Body mass index is 28.41 kg/(m^2). Ideal Body Weight: Weight in (lb) to have BMI = 25: 154.6   GEN: WDWN, NAD, Non-toxic, A & O x 3 HEENT: Atraumatic, Normocephalic. Neck supple. No masses, No LAD. Ears and Nose: No external deformity. CV: RRR, No M/G/R. No JVD. No thrill. No extra heart sounds. PULM: CTA B, no wheezes, crackles, rhonchi. No retractions. No resp. distress. No accessory muscle use. ABD: S, NT, ND, +BS. No rebound. No HSM. EXTR: No  c/c/e NEURO Normal gait.  PSYCH: Normally interactive. Conversant. Not depressed or anxious appearing.  Calm demeanor.  Left ankle:  Minimal swelling of lateral ankle, full ROM, no heat or redness/ brusising Neck: normal ROM, spasm and tightness or right trapezius muscles.   [facility] reading (PRIMARY) by  Dr. Patsy Lager Left ankle: normal Cervical spine: significant degerative change C5- C6  CERVICAL SPINE - COMPLETE 4+ VIEW  Comparison: No priors.  Findings: AP, lateral, odontoid and bilateral shallow oblique views of the cervical spine demonstrate no definite acute displaced fractures of the cervical spine. Alignment is anatomic. Prevertebral soft tissues are normal. There is multilevel degenerative disc disease, most severe at C5-C6 and C6-C7. Mild multilevel facet arthropathy is also  noted.  IMPRESSION: 1. Multilevel degenerative disc disease and cervical spondylosis, as above.  LEFT ANKLE COMPLETE - 3+ VIEW  Comparison: No priors.  Findings: Three views left ankle demonstrate no acute fracture, subluxation, dislocation, joint or soft tissue abnormality.  IMPRESSION: 1. No acute radiographic abnormality of the left ankle.   Assessment and Plan: 1. Anxiety  clonazePAM (KLONOPIN) 0.5 MG tablet  2. Ankle pain  DG Ankle Complete Left  3. Neck pain  DG Cervical Spine Complete, cyclobenzaprine (FLEXERIL) 10 MG tablet, traMADol (ULTRAM) 50 MG tablet  4. Abdominal hernia     [name] does seem to suffer from a lot of anxiety.  She is interested in trying clonazepam, and will start with 0.25mg  BID as needed.  Cautioned re: sedation and especially driving with this medication.   Suspect ankle sprain.  Gave S[place]-o brace which did feel good to her.  She will let me know if not better in the next few days.    Chronic neck and shoulder pain:  Suspect that she has nerve impingement.  Will treat with flexeril and ultram as needed.  Recommended that we set her up to see an orthopedist.  However, as she is going to be having her hernia repair soon she wishes to wait until this issue is resolved. She will let me know when she is ready to pursue further evaluation.    Abbe Amsterdam, MD

## 2012-05-20 ENCOUNTER — Telehealth: Payer: Self-pay | Admitting: Gastroenterology

## 2012-05-20 ENCOUNTER — Encounter (INDEPENDENT_AMBULATORY_CARE_PROVIDER_SITE_OTHER): Payer: Self-pay | Admitting: General Surgery

## 2012-05-20 ENCOUNTER — Ambulatory Visit (INDEPENDENT_AMBULATORY_CARE_PROVIDER_SITE_OTHER): Payer: BC Managed Care – PPO | Admitting: General Surgery

## 2012-05-20 VITALS — BP 139/92 | HR 95 | Temp 97.0°F | Ht 64.0 in | Wt 173.2 lb

## 2012-05-20 DIAGNOSIS — K432 Incisional hernia without obstruction or gangrene: Secondary | ICD-10-CM

## 2012-05-20 DIAGNOSIS — K219 Gastro-esophageal reflux disease without esophagitis: Secondary | ICD-10-CM

## 2012-05-20 DIAGNOSIS — K439 Ventral hernia without obstruction or gangrene: Secondary | ICD-10-CM

## 2012-05-20 DIAGNOSIS — R1310 Dysphagia, unspecified: Secondary | ICD-10-CM

## 2012-05-20 NOTE — Progress Notes (Signed)
Patient ID: [name], female   DOB: 06-27-1956, 56 y.o.   MRN: [id number]  Chief Complaint  Patient presents with  . Follow-up    reck hernia    HPI [name] is a 56 y.o. female.   HPI  She is self-referred for evaluation of recurrent ventral hernia. In May of 2011, she underwent a subtotal colectomy and primary repair of incisional hernia. She noted a small bulge forming in the upper portion of her incision in the recent past and a few weeks ago became uncomfortable. No nausea or vomiting. She called and arranged for this appointment.  She is also having problems with recurrent dysphagia and has been seen and treated for this by Dr. Melvia Heaps in the past.  No intestinal obstruction symptoms.  Past Medical History  Diagnosis Date  . Hepatitis C   . Arthritis   . GERD (gastroesophageal reflux disease)   . Diverticulitis large intestine   . 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 Cholecystectomy   . Appendectomy   . Abdominal surgery   . Abdominal hysterectomy   . Hernia repair   . Colon surgery     subtotal colectomy    History reviewed. No pertinent family history.  Social History History  Substance Use Topics  . Smoking status: Never Smoker   . Smokeless tobacco: Not on file  . Alcohol Use: Yes    No Known Allergies  Current Outpatient Prescriptions  Medication Sig Dispense Refill  . clonazePAM (KLONOPIN) 0.5 MG tablet Take 0.5 tablets (0.25 mg total) by mouth 2 (two) times daily as needed for anxiety. May take a whole tablet if a half if not adequate.  20 tablet  1  . cyclobenzaprine (FLEXERIL) 10 MG tablet Take 1 tablet (10 mg total) by mouth 2 (two) times daily as needed for muscle spasms.  30 tablet  0  . traMADol (ULTRAM) 50 MG tablet Take 1 tablet (50 mg total) by mouth every 8 (eight) hours as needed for pain.  30 tablet  0    Review of Systems Review of Systems    Constitutional: Negative.   HENT: Negative.   Respiratory: Negative.   Cardiovascular: Negative.   Gastrointestinal:       Dysphagia.  Musculoskeletal: Positive for joint swelling (left ankle).  Neurological:       Facial and neck neuropathy.    Blood pressure 139/92, pulse 95, temperature 97 F (36.1 C), temperature source Temporal, height 5\' 4"  (1.626 m), weight 173 lb 3.2 oz (78.563 kg), SpO2 98.00%.  Physical Exam Physical Exam  Constitutional: She appears well-developed and well-nourished.  HENT:  Head: Normocephalic and atraumatic.  Mouth/Throat: Oropharynx is clear and moist.  Neck: Neck supple. No tracheal deviation present.  Abdominal: Soft. She exhibits no mass. There is no tenderness.       idline incision with weakness and bulge in the epigastric portion as well as the periumbilical portion.  Musculoskeletal: She exhibits edema (left lateral ankle).  Lymphadenopathy:    She has no cervical adenopathy.  Skin: Skin is warm and dry.    Data Reviewed Old chart  Assessment    Recurrent Ventral incisional hernia-no evidence of obstruction.  Recurrent dysphagia    Plan    CT scan of the abdomen to better define the hernia.  Refer back to Dr. Arlyce Dice for evaluation of dysphagia.  We discussed laparoscopic ventral hernia repair  with mesh once her dysphagia evaluation was complete.  I have discussed the procedure, risks, and aftercare. Risks include but are not limited to bleeding, infection, wound healing problems, anesthesia, recurrence, accidental injury to intra-abdominal organs-such as intestine, liver, spleen, bladder, etc. We also discussed the rare complication of mesh rejection. All questions were answered.       Neomi Laidler J 05/20/2012, 9:54 AM

## 2012-05-20 NOTE — Telephone Encounter (Signed)
Spoke with patient and she is having dysphagia. Sometimes vomits food up. Has been getting worse in the last 2 weeks. She saw a surgeon today for a hernia that needs repair and she was told she needs to take care of dysphagia first. Scheduled with Dr. Arlyce Dice on 05/22/12 at 2:30 PM.

## 2012-05-20 NOTE — Patient Instructions (Signed)
We will arrange for a CT scan to look at the hernia.  We will make a referral to Dr. Marzetta Board office.

## 2012-05-22 ENCOUNTER — Encounter: Payer: Self-pay | Admitting: Gastroenterology

## 2012-05-22 ENCOUNTER — Ambulatory Visit (INDEPENDENT_AMBULATORY_CARE_PROVIDER_SITE_OTHER): Payer: BC Managed Care – PPO | Admitting: Gastroenterology

## 2012-05-22 VITALS — BP 116/80 | HR 80 | Ht 64.0 in | Wt 175.0 lb

## 2012-05-22 DIAGNOSIS — K222 Esophageal obstruction: Secondary | ICD-10-CM

## 2012-05-22 DIAGNOSIS — K432 Incisional hernia without obstruction or gangrene: Secondary | ICD-10-CM

## 2012-05-22 NOTE — Assessment & Plan Note (Signed)
Per Dr. Abbey Chatters

## 2012-05-22 NOTE — Progress Notes (Signed)
History of Present Illness:  [name] has returned for evaluation of dysphagia. She has a history of a recurrent esophageal stricture for which she has undergone dilatation. Last endoscopy was in 2010. Over the past 6 months she's developed increasing dysphagia to solids and now liquids. She denies pyrosis. Since her last visit she underwent a subtotal colectomy because recurrent diverticular disease. She's developmental ventral wall hernias for which she is scheduled for repair.    Review of Systems: She has a neck pain that radiates to her upper arms Pertinent positive and negative review of systems were noted in the above HPI section. All other review of systems were otherwise negative.    Current Medications, Allergies, Past Medical History, Past Surgical History, Family History and Social History were reviewed in [employer] [facility] electronic medical record  Vital signs were reviewed in today's medical record. Physical Exam: General: Well developed , well nourished, no acute distress Head: Normocephalic and atraumatic Eyes:  sclerae anicteric, EOMI Ears: Normal auditory acuity Mouth: No deformity or lesions Lungs: Clear throughout to auscultation Heart: Regular rate and rhythm; no murmurs, rubs or bruits Abdomen: Soft, non tender and non distended. No masses, hepatosplenomegaly or hernias noted. Normal Bowel sounds. There is an umbilical hernia and a midline ventral hernia in epigastrium Rectal:deferred Musculoskeletal: Symmetrical with no gross deformities  Pulses:  Normal pulses noted Extremities: No clubbing, cyanosis, edema or deformities noted Neurological: Alert oriented x 4, grossly nonfocal Psychological:  Alert and cooperative. Normal mood and affect

## 2012-05-22 NOTE — Assessment & Plan Note (Signed)
She is symptomatic again from a recurrent stricture.  Recommendations #1 upper endoscopy with dilatation as indicated

## 2012-06-11 ENCOUNTER — Encounter: Payer: Self-pay | Admitting: Gastroenterology

## 2012-06-11 ENCOUNTER — Ambulatory Visit (AMBULATORY_SURGERY_CENTER): Payer: BC Managed Care – PPO | Admitting: Gastroenterology

## 2012-06-11 VITALS — BP 134/78 | HR 90 | Temp 97.8°F | Resp 20 | Ht 64.0 in | Wt 175.0 lb

## 2012-06-11 DIAGNOSIS — K222 Esophageal obstruction: Secondary | ICD-10-CM

## 2012-06-11 DIAGNOSIS — K432 Incisional hernia without obstruction or gangrene: Secondary | ICD-10-CM

## 2012-06-11 MED ORDER — OMEPRAZOLE 20 MG PO CPDR: 20.0000 mg | DELAYED_RELEASE_CAPSULE | Freq: Every day | ORAL | Status: DC

## 2012-06-11 MED ORDER — SODIUM CHLORIDE 0.9 % IV SOLN: 500.0000 mL | INTRAVENOUS | Status: DC

## 2012-06-11 NOTE — Progress Notes (Signed)
Propofol given and oxygen managed per S [place] CRNA 

## 2012-06-11 NOTE — Progress Notes (Signed)
Patient did not experience any of the following events: a burn prior to discharge; a fall within the facility; wrong site/side/patient/procedure/implant event; or a hospital transfer or hospital admission upon discharge from the facility. (G8907) Patient did not have preoperative order for IV antibiotic SSI prophylaxis. (G8918)  

## 2012-06-11 NOTE — Op Note (Signed)
[facility] [address] [place], [place]  [address]  ENDOSCOPY PROCEDURE REPORT  PATIENT:  Michelle, [name]  MR#:  [id number] BIRTHDATE:  06/15/1956, 56 yrs. old  GENDER:  female  ENDOSCOPIST:  Barbette Hair. Arlyce Dice, MD Referred by:  Avel Peace, M.D.  PROCEDURE DATE:  06/11/2012 PROCEDURE:  EGD, diagnostic 43235, Maloney Dilation of Esophagus ASA CLASS:  Class II INDICATIONS:  dysphagia  MEDICATIONS:   MAC sedation, administered by CRNA propofol 180mg IV, glycopyrrolate (Robinal) 0.2 mg IV, 0.6cc simethancone 0.6 cc PO TOPICAL ANESTHETIC:  DESCRIPTION OF PROCEDURE:   After the risks and benefits of the procedure were explained, informed consent was obtained.  The LB GIF-H180 [id number] endoscope was introduced through the mouth and advanced to the third portion of the duodenum.  The instrument was slowly withdrawn as the mucosa was fully examined. <<PROCEDUREIMAGES>>  A stricture was found at the gastroesophageal junction (see image4). Moderate stricture with mild mucosal inflammation Dilation with maloney dilator 18mm Moderate resistance; no heme  A hiatal hernia was found. 3cm sliding hiatal hernia  Otherwise the examination was normal (see image2 and image3).    Retroflexed views revealed no abnormalities.    The scope was then withdrawn from the patient and the procedure completed.  COMPLICATIONS:  None  ENDOSCOPIC IMPRESSION: 1) Stricture at the gastroesophageal junction - s/p maloney dilitation 2) Hiatal hernia 3) Otherwise normal examination RECOMMENDATIONS: 1) Prilosec 20 mg po q am 2) dilatations PRN  ______________________________ Barbette Hair. Arlyce Dice, MD  CC:  n. eSIGNED:   Barbette Hair. Quantavia Frith at 06/11/2012 10:46 AM  Winfred Burn, [phone]

## 2012-06-11 NOTE — Patient Instructions (Addendum)
Findings: Stricture, hiatal hernia   Prilosec 20 mg every morning.  Please follow dilations diet given to you and explained by nurse.  Dilations as needed.  YOU HAD AN ENDOSCOPIC PROCEDURE TODAY AT THE [facility]: Refer to the procedure report that was given to you for any specific questions about what was found during the examination.  If the procedure report does not answer your questions, please call your gastroenterologist to clarify.  If you requested that your care partner not be given the details of your procedure findings, then the procedure report has been included in a sealed envelope for you to review at your convenience later.  YOU SHOULD EXPECT: Some feelings of bloating in the abdomen. Passage of more gas than usual.  Walking can help get rid of the air that was put into your GI tract during the procedure and reduce the bloating. If you had a lower endoscopy (such as a colonoscopy or flexible sigmoidoscopy) you may notice spotting of blood in your stool or on the toilet paper. If you underwent a bowel prep for your procedure, then you may not have a normal bowel movement for a few days.  DIET: Your first meal following the procedure should be a light meal and then it is ok to progress to your normal diet.  A half-sandwich or bowl of soup is an example of a good first meal.  Heavy or fried foods are harder to digest and may make you feel nauseous or bloated.  Likewise meals heavy in dairy and vegetables can cause extra gas to form and this can also increase the bloating.  Drink plenty of fluids but you should avoid alcoholic beverages for 24 hours.  ACTIVITY: Your care partner should take you home directly after the procedure.  You should plan to take it easy, moving slowly for the rest of the day.  You can resume normal activity the day after the procedure however you should NOT DRIVE or use heavy machinery for 24 hours (because of the sedation medicines used during the test).      SYMPTOMS TO REPORT IMMEDIATELY: A gastroenterologist can be reached at any hour.  During normal business hours, 8:30 AM to 5:00 PM Monday through Friday, call [phone].  After hours and on weekends, please call the GI answering service at [phone] who will take a message and have the physician on call contact you.   Following lower endoscopy (colonoscopy or flexible sigmoidoscopy):  Excessive amounts of blood in the stool  Significant tenderness or worsening of abdominal pains  Swelling of the abdomen that is new, acute  Fever of 100F or higher  Following upper endoscopy (EGD)  Vomiting of blood or coffee ground material  New chest pain or pain under the shoulder blades  Painful or persistently difficult swallowing  New shortness of breath  Fever of 100F or higher  Black, tarry-looking stools  FOLLOW UP: If any biopsies were taken you will be contacted by phone or by letter within the next 1-3 weeks.  Call your gastroenterologist if you have not heard about the biopsies in 3 weeks.  Our staff will call the home number listed on your records the next business day following your procedure to check on you and address any questions or concerns that you may have at that time regarding the information given to you following your procedure. This is a courtesy call and so if there is no answer at the home number and we have not  heard from you through the emergency physician on call, we will assume that you have returned to your regular daily activities without incident.  SIGNATURES/CONFIDENTIALITY: You and/or your care partner have signed paperwork which will be entered into your electronic medical record.  These signatures attest to the fact that that the information above on your After Visit Summary has been reviewed and is understood.  Full responsibility of the confidentiality of this discharge information lies with you and/or your care-partner.   Please follow all discharge  instructions given to you by the recovery room nurse. If you have any questions or problems after discharge please call one of the numbers listed above. You will receive a phone call in the am to see how you are doing and answer any questions you may have. Thank you for choosing [facility] for your health care needs.

## 2012-06-12 ENCOUNTER — Telehealth (INDEPENDENT_AMBULATORY_CARE_PROVIDER_SITE_OTHER): Payer: Self-pay

## 2012-06-12 ENCOUNTER — Telehealth: Payer: Self-pay

## 2012-06-12 NOTE — Telephone Encounter (Signed)
Per notes and order in system Ct set up for 7-18. Pt aware. Pt will call to see if can proceed with surgery after Ct done and reviewed by Dr Abbey Chatters.

## 2012-06-12 NOTE — Telephone Encounter (Signed)
  Follow up Call-  Call back number 06/11/2012  Post procedure Call Back phone  # [phone]  Permission to leave phone message Yes     Patient questions:  Do you have a fever, pain , or abdominal swelling? no Pain Score  0 *  Have you tolerated food without any problems? yes  Have you been able to return to your normal activities? yes  Do you have any questions about your discharge instructions: Diet   no Medications  no Follow up visit  no  Do you have questions or concerns about your Care? no  Actions: * If pain score is 4 or above: No action needed, pain <4.

## 2012-06-18 ENCOUNTER — Ambulatory Visit
Admission: RE | Admit: 2012-06-18 | Discharge: 2012-06-18 | Disposition: A | Discharge status: Home / Self Care | Payer: BC Managed Care – PPO | Source: Ambulatory Visit | Attending: General Surgery | Admitting: General Surgery

## 2012-06-18 DIAGNOSIS — K439 Ventral hernia without obstruction or gangrene: Secondary | ICD-10-CM

## 2012-06-18 MED ORDER — IOHEXOL 300 MG/ML  SOLN
100.0000 mL | Freq: Once | INTRAMUSCULAR | Status: AC | PRN
  Administered 2012-06-18: 100 mL via INTRAVENOUS

## 2012-06-19 ENCOUNTER — Telehealth (INDEPENDENT_AMBULATORY_CARE_PROVIDER_SITE_OTHER): Payer: Self-pay

## 2012-06-19 NOTE — Telephone Encounter (Signed)
Called pt with results of CT on 7/18. Will discuss with Dr. Abbey Chatters on 7/22 and call her back.

## 2012-06-22 ENCOUNTER — Encounter (INDEPENDENT_AMBULATORY_CARE_PROVIDER_SITE_OTHER): Payer: Self-pay | Admitting: General Surgery

## 2012-06-22 NOTE — Progress Notes (Signed)
Patient ID: [name], female   DOB: [birth year], 56 y.o.   MRN: [id number] I discussed the results of her CT scan with her. It shows 2 small ventral hernias above the umbilicus containing nondilated loops of small bowel.  There is a small hernia above these containing fat. She is interested in proceeding with the surgery as we discussed last month. We will give her a 1 day bowel prep and scheduled the surgery.

## 2012-06-24 ENCOUNTER — Other Ambulatory Visit (INDEPENDENT_AMBULATORY_CARE_PROVIDER_SITE_OTHER): Payer: Self-pay | Admitting: General Surgery

## 2012-06-24 ENCOUNTER — Telehealth (INDEPENDENT_AMBULATORY_CARE_PROVIDER_SITE_OTHER): Payer: Self-pay

## 2012-06-24 NOTE — Telephone Encounter (Signed)
Patient called saying she hasn't heard anything about surgery scheduling. I told her [name] mailed the bowel prep Monday and that the schedulers have orders and will call her when they have a date worked out.

## 2012-07-06 ENCOUNTER — Encounter (INDEPENDENT_AMBULATORY_CARE_PROVIDER_SITE_OTHER): Payer: BC Managed Care – PPO | Admitting: General Surgery

## 2012-07-15 ENCOUNTER — Telehealth: Payer: Self-pay

## 2012-07-15 DIAGNOSIS — F419 Anxiety disorder, unspecified: Secondary | ICD-10-CM

## 2012-07-15 DIAGNOSIS — M542 Cervicalgia: Secondary | ICD-10-CM

## 2012-07-15 MED ORDER — TRAMADOL HCL 50 MG PO TABS: 50.0000 mg | ORAL_TABLET | Freq: Three times a day (TID) | ORAL | Status: DC | PRN

## 2012-07-15 MED ORDER — CYCLOBENZAPRINE HCL 10 MG PO TABS: 5.0000 mg | ORAL_TABLET | Freq: Two times a day (BID) | ORAL | Status: DC | PRN

## 2012-07-15 MED ORDER — CLONAZEPAM 0.5 MG PO TABS: 0.2500 mg | ORAL_TABLET | Freq: Two times a day (BID) | ORAL | Status: DC | PRN

## 2012-07-15 NOTE — Telephone Encounter (Signed)
Dr Patsy Lager, you Rx'd these for pt on 05/17/12. Do you want to RF the Rxs for flexeril and klonopin?

## 2012-07-15 NOTE — Telephone Encounter (Signed)
Called her- she is having surgery to repair a ventral hernia soon, and continues to have neck pain.  She has had neck pain for some time, and did get better with ultram and tramadol in June.  Klonopin also did help her anxiety.  Agreed to refill these medications as detailed in meds and orders.  Cautioned her not to use these medications when she will be going under anesthesia however.

## 2012-07-15 NOTE — Telephone Encounter (Signed)
PT STATES SHE IS TO HAVE SURGERY PRETTY SOON AND THE DR HAD GIVEN HER A PRESCRIPTION FOR ANXIETY MEDICINE AND A MUSCLE RELAXER. REALLY WOULD LIKE TO HAVE A REFILL ON THEM BOTH. PLEASE CALL HER WORK [phone] OR HER CELL AT [phone]   [facility] [address]

## 2012-07-23 ENCOUNTER — Telehealth (INDEPENDENT_AMBULATORY_CARE_PROVIDER_SITE_OTHER): Payer: Self-pay

## 2012-07-23 NOTE — Telephone Encounter (Signed)
Called to check on the patient and recommend that she wear her abdominal binder as much as possible before surgery.  She says it does not help very much, and she is having a hard time with meals, and food not "going down".  I told her the OR scheduler is trying to find a sooner op date for her, and that I would keep her informed.  She understood and agreed.

## 2012-07-24 ENCOUNTER — Encounter (HOSPITAL_COMMUNITY): Payer: Self-pay | Admitting: Pharmacy Technician

## 2012-07-30 ENCOUNTER — Encounter (HOSPITAL_COMMUNITY)
Admission: RE | Admit: 2012-07-30 | Discharge: 2012-07-30 | Disposition: A | Discharge status: Home / Self Care | Payer: BC Managed Care – PPO | Source: Ambulatory Visit | Attending: General Surgery | Admitting: General Surgery

## 2012-07-30 ENCOUNTER — Telehealth: Payer: Self-pay

## 2012-07-30 ENCOUNTER — Encounter (HOSPITAL_COMMUNITY): Payer: Self-pay

## 2012-07-30 HISTORY — DX: Anxiety disorder, unspecified: F41.9

## 2012-07-30 HISTORY — DX: Nocturia: R35.1

## 2012-07-30 HISTORY — DX: Insomnia, unspecified: G47.00

## 2012-07-30 HISTORY — DX: Reserved for inherently not codable concepts without codable children: IMO0001

## 2012-07-30 HISTORY — DX: Personal history of other diseases of the digestive system: Z87.19

## 2012-07-30 LAB — COMPREHENSIVE METABOLIC PANEL
Alkaline Phosphatase: 55 U/L (ref 39–117)
BUN: 15 mg/dL (ref 6–23)
GFR calc Af Amer: >90 mL/min (ref >90)
Glucose, Bld: 98 mg/dL (ref 70–99)
Potassium: 4.2 mEq/L (ref 3.5–5.1)
Total Protein: 7.4 g/dL (ref 6.0–8.3)

## 2012-07-30 LAB — PROTIME-INR: Prothrombin Time: 12.4 seconds (ref 11.6–15.2)

## 2012-07-30 LAB — CBC WITH DIFFERENTIAL/PLATELET
Eosinophils Absolute: 0.2 10*3/uL (ref 0.0–0.7)
Eosinophils Relative: 3 % (ref 0–5)
Hemoglobin: 14.2 g/dL (ref 12.0–15.0)
Lymphs Abs: 2 10*3/uL (ref 0.7–4.0)
MCH: 29.8 pg (ref 26.0–34.0)
MCV: 84.7 fL (ref 78.0–100.0)
Monocytes Relative: 7 % (ref 3–12)
RBC: 4.77 MIL/uL (ref 3.87–5.11)

## 2012-07-30 LAB — TYPE AND SCREEN: Antibody Screen: NEGATIVE

## 2012-07-30 NOTE — Telephone Encounter (Signed)
[facility] pre-op would like last EKG on file (should be in paper chart) and last office visit pre-epic info faxed to them. [id number]  [facility]# [phone] Fax# [phone]

## 2012-07-30 NOTE — Progress Notes (Signed)
Bowel prep reviewed with pt.

## 2012-07-30 NOTE — Telephone Encounter (Signed)
Printed out phone message due to patient only having a paper chart. °

## 2012-07-30 NOTE — Pre-Procedure Instructions (Signed)
[name]  07/30/2012   Your procedure is scheduled on:  08-04-2012  Report to [facility] at 10:00 AM.  Call this number if you have problems the morning of surgery: [phone]   Remember:   Do not eat food or drink:After Midnight.     Take these medicines the morning of surgery with A SIP OF WATER: clonazepam as needed,omeprazole,tramadol as needed   Do not wear jewelry, make-up or nail polish.  Do not wear lotions, powders, or perfumes. You may wear deodorant.  Do not shave 48 hours prior to surgery. Men may shave face and neck.  Do not bring valuables to the hospital.  Contacts, dentures or bridgework may not be worn into surgery.  Leave suitcase in the car. After surgery it may be brought to your room.  For patients admitted to the hospital, checkout time is 11:00 AM the day of discharge.   Patients discharged the day of surgery will not be allowed to drive home.  Name and phone number of your driver: ___________________    -  Special Instructions: CHG Shower Use Special Wash: 1/2 bottle night before surgery and 1/2 bottle morning of surgery.     Please read over the following fact sheets that you were given: Pain Booklet, Coughing and Deep Breathing, MRSA Information and Surgical Site Infection Prevention

## 2012-08-03 MED ORDER — CEFAZOLIN SODIUM-DEXTROSE 2-3 GM-% IV SOLR
2.0000 g | INTRAVENOUS | Status: AC
  Administered 2012-08-04: 2 g via INTRAVENOUS
  Filled 2012-08-03: qty 50

## 2012-08-04 ENCOUNTER — Encounter (HOSPITAL_COMMUNITY): Payer: Self-pay | Admitting: Anesthesiology

## 2012-08-04 ENCOUNTER — Ambulatory Visit (HOSPITAL_COMMUNITY): Payer: BC Managed Care – PPO | Admitting: Anesthesiology

## 2012-08-04 ENCOUNTER — Inpatient Hospital Stay (HOSPITAL_COMMUNITY)
Admission: RE | Admit: 2012-08-04 | Discharge: 2012-08-08 | DRG: 159 | Disposition: A | Discharge status: Home / Self Care | Payer: BC Managed Care – PPO | Source: Ambulatory Visit | Attending: General Surgery | Admitting: General Surgery

## 2012-08-04 ENCOUNTER — Encounter (HOSPITAL_COMMUNITY)
Admission: RE | Disposition: A | Discharge status: Home / Self Care | Payer: Self-pay | Source: Ambulatory Visit | Attending: General Surgery

## 2012-08-04 DIAGNOSIS — Z79899 Other long term (current) drug therapy: Secondary | ICD-10-CM

## 2012-08-04 DIAGNOSIS — M129 Arthropathy, unspecified: Secondary | ICD-10-CM | POA: Diagnosis present

## 2012-08-04 DIAGNOSIS — B1920 Unspecified viral hepatitis C without hepatic coma: Secondary | ICD-10-CM | POA: Diagnosis present

## 2012-08-04 DIAGNOSIS — K660 Peritoneal adhesions (postprocedural) (postinfection): Secondary | ICD-10-CM

## 2012-08-04 DIAGNOSIS — K432 Incisional hernia without obstruction or gangrene: Principal | ICD-10-CM | POA: Diagnosis present

## 2012-08-04 DIAGNOSIS — Z01812 Encounter for preprocedural laboratory examination: Secondary | ICD-10-CM

## 2012-08-04 DIAGNOSIS — K219 Gastro-esophageal reflux disease without esophagitis: Secondary | ICD-10-CM | POA: Diagnosis present

## 2012-08-04 DIAGNOSIS — F411 Generalized anxiety disorder: Secondary | ICD-10-CM | POA: Diagnosis present

## 2012-08-04 HISTORY — PX: DIAGNOSTIC LAPAROSCOPY: SUR761

## 2012-08-04 HISTORY — DX: Attention-deficit hyperactivity disorder, unspecified type: F90.9

## 2012-08-04 HISTORY — PX: VENTRAL HERNIA REPAIR: SHX424

## 2012-08-04 SURGERY — REPAIR, HERNIA, VENTRAL, LAPAROSCOPIC
Anesthesia: General | Site: Abdomen | Wound class: Clean Contaminated

## 2012-08-04 MED ORDER — MORPHINE SULFATE (PF) 1 MG/ML IV SOLN
INTRAVENOUS | Status: DC
  Administered 2012-08-04: 48.32 mL via INTRAVENOUS
  Administered 2012-08-04 (×2): via INTRAVENOUS
  Administered 2012-08-04: 8 mg via INTRAVENOUS
  Administered 2012-08-05: 21.5 mL via INTRAVENOUS
  Administered 2012-08-05: 06:00:00 via INTRAVENOUS
  Administered 2012-08-05: 16.4 mg via INTRAVENOUS
  Filled 2012-08-04 (×3): qty 25

## 2012-08-04 MED ORDER — LACTATED RINGERS IV SOLN
INTRAVENOUS | Status: DC | PRN
  Administered 2012-08-04 (×2): via INTRAVENOUS

## 2012-08-04 MED ORDER — PROPOFOL 10 MG/ML IV EMUL
INTRAVENOUS | Status: DC | PRN
  Administered 2012-08-04: 200 mg via INTRAVENOUS

## 2012-08-04 MED ORDER — BUPIVACAINE-EPINEPHRINE (PF) 0.5% -1:200000 IJ SOLN
INTRAMUSCULAR | Status: AC
  Filled 2012-08-04: qty 10

## 2012-08-04 MED ORDER — KCL IN DEXTROSE-NACL 20-5-0.9 MEQ/L-%-% IV SOLN
INTRAVENOUS | Status: DC
  Administered 2012-08-04 – 2012-08-07 (×6): via INTRAVENOUS
  Filled 2012-08-04 (×9): qty 1000

## 2012-08-04 MED ORDER — MEPERIDINE HCL 25 MG/ML IJ SOLN: 6.2500 mg | INTRAMUSCULAR | Status: DC | PRN

## 2012-08-04 MED ORDER — PROMETHAZINE HCL 25 MG/ML IJ SOLN: 6.2500 mg | INTRAMUSCULAR | Status: DC | PRN

## 2012-08-04 MED ORDER — NEOSTIGMINE METHYLSULFATE 1 MG/ML IJ SOLN
INTRAMUSCULAR | Status: DC | PRN
  Administered 2012-08-04: 3 mg via INTRAVENOUS

## 2012-08-04 MED ORDER — 0.9 % SODIUM CHLORIDE (POUR BTL) OPTIME
TOPICAL | Status: DC | PRN
  Administered 2012-08-04: 1000 mL

## 2012-08-04 MED ORDER — SODIUM CHLORIDE 0.9 % IJ SOLN: 9.0000 mL | INTRAMUSCULAR | Status: DC | PRN

## 2012-08-04 MED ORDER — MIDAZOLAM HCL 5 MG/5ML IJ SOLN
INTRAMUSCULAR | Status: DC | PRN
  Administered 2012-08-04: 2 mg via INTRAVENOUS

## 2012-08-04 MED ORDER — FENTANYL CITRATE 0.05 MG/ML IJ SOLN
INTRAMUSCULAR | Status: DC | PRN
  Administered 2012-08-04 (×2): 50 ug via INTRAVENOUS
  Administered 2012-08-04: 250 ug via INTRAVENOUS

## 2012-08-04 MED ORDER — LACTATED RINGERS IV SOLN
INTRAVENOUS | Status: DC
  Administered 2012-08-04: 11:00:00 via INTRAVENOUS

## 2012-08-04 MED ORDER — HYDROMORPHONE HCL PF 1 MG/ML IJ SOLN
INTRAMUSCULAR | Status: AC
  Administered 2012-08-04: 0.5 mg via INTRAVENOUS
  Filled 2012-08-04: qty 1

## 2012-08-04 MED ORDER — ROCURONIUM BROMIDE 100 MG/10ML IV SOLN
INTRAVENOUS | Status: DC | PRN
  Administered 2012-08-04: 40 mg via INTRAVENOUS
  Administered 2012-08-04: 10 mg via INTRAVENOUS

## 2012-08-04 MED ORDER — LIDOCAINE HCL (CARDIAC) 20 MG/ML IV SOLN
INTRAVENOUS | Status: DC | PRN
  Administered 2012-08-04: 30 mg via INTRAVENOUS

## 2012-08-04 MED ORDER — LIDOCAINE HCL 4 % MT SOLN
OROMUCOSAL | Status: DC | PRN
  Administered 2012-08-04: 4 mL via TOPICAL

## 2012-08-04 MED ORDER — ONDANSETRON HCL 4 MG/2ML IJ SOLN
INTRAMUSCULAR | Status: DC | PRN
  Administered 2012-08-04: 4 mg via INTRAVENOUS

## 2012-08-04 MED ORDER — MORPHINE SULFATE (PF) 1 MG/ML IV SOLN
INTRAVENOUS | Status: AC
  Filled 2012-08-04: qty 25

## 2012-08-04 MED ORDER — GLYCOPYRROLATE 0.2 MG/ML IJ SOLN
INTRAMUSCULAR | Status: DC | PRN
  Administered 2012-08-04: .4 mg via INTRAVENOUS

## 2012-08-04 MED ORDER — ONDANSETRON HCL 4 MG PO TABS: 4.0000 mg | ORAL_TABLET | Freq: Four times a day (QID) | ORAL | Status: DC | PRN

## 2012-08-04 MED ORDER — BUPIVACAINE HCL (PF) 0.5 % IJ SOLN
INTRAMUSCULAR | Status: AC
  Filled 2012-08-04: qty 30

## 2012-08-04 MED ORDER — CEFAZOLIN SODIUM 1-5 GM-% IV SOLN
1.0000 g | Freq: Four times a day (QID) | INTRAVENOUS | Status: AC
  Administered 2012-08-04 – 2012-08-05 (×3): 1 g via INTRAVENOUS
  Filled 2012-08-04 (×3): qty 50

## 2012-08-04 MED ORDER — PANTOPRAZOLE SODIUM 40 MG PO TBEC
40.0000 mg | DELAYED_RELEASE_TABLET | Freq: Every day | ORAL | Status: DC
  Administered 2012-08-05 – 2012-08-07 (×3): 40 mg via ORAL
  Filled 2012-08-04 (×3): qty 1

## 2012-08-04 MED ORDER — KCL IN DEXTROSE-NACL 20-5-0.45 MEQ/L-%-% IV SOLN
INTRAVENOUS | Status: AC
  Filled 2012-08-04: qty 1000

## 2012-08-04 MED ORDER — HYDROMORPHONE HCL PF 1 MG/ML IJ SOLN
0.2500 mg | INTRAMUSCULAR | Status: DC | PRN
  Administered 2012-08-04 (×4): 0.5 mg via INTRAVENOUS

## 2012-08-04 MED ORDER — ONDANSETRON HCL 4 MG/2ML IJ SOLN
4.0000 mg | Freq: Four times a day (QID) | INTRAMUSCULAR | Status: DC | PRN
  Administered 2012-08-05: 4 mg via INTRAVENOUS
  Filled 2012-08-04 (×2): qty 2

## 2012-08-04 MED ORDER — NALOXONE HCL 0.4 MG/ML IJ SOLN: 0.4000 mg | INTRAMUSCULAR | Status: DC | PRN

## 2012-08-04 MED ORDER — ACETAMINOPHEN 10 MG/ML IV SOLN
INTRAVENOUS | Status: AC
  Filled 2012-08-04: qty 100

## 2012-08-04 MED ORDER — MIDAZOLAM HCL 2 MG/2ML IJ SOLN: 0.5000 mg | Freq: Once | INTRAMUSCULAR | Status: DC | PRN

## 2012-08-04 MED ORDER — ONDANSETRON HCL 4 MG/2ML IJ SOLN: 4.0000 mg | Freq: Four times a day (QID) | INTRAMUSCULAR | Status: DC | PRN

## 2012-08-04 MED ORDER — DIPHENHYDRAMINE HCL 12.5 MG/5ML PO ELIX
12.5000 mg | ORAL_SOLUTION | Freq: Four times a day (QID) | ORAL | Status: DC | PRN
  Filled 2012-08-04: qty 5

## 2012-08-04 MED ORDER — BUPIVACAINE-EPINEPHRINE 0.5% -1:200000 IJ SOLN
INTRAMUSCULAR | Status: DC | PRN
  Administered 2012-08-04: 30 mL

## 2012-08-04 MED ORDER — DIPHENHYDRAMINE HCL 50 MG/ML IJ SOLN: 12.5000 mg | Freq: Four times a day (QID) | INTRAMUSCULAR | Status: DC | PRN

## 2012-08-04 MED ORDER — ACETAMINOPHEN 10 MG/ML IV SOLN
INTRAVENOUS | Status: DC | PRN
  Administered 2012-08-04: 1000 mg via INTRAVENOUS

## 2012-08-04 MED ORDER — CLONAZEPAM 0.5 MG PO TABS: 0.5000 mg | ORAL_TABLET | Freq: Every evening | ORAL | Status: DC | PRN

## 2012-08-04 SURGICAL SUPPLY — 48 items
APPLIER CLIP LOGIC TI 5 (MISCELLANEOUS) IMPLANT
BANDAGE ADHESIVE 1X3 (GAUZE/BANDAGES/DRESSINGS) IMPLANT
BENZOIN TINCTURE PRP APPL 2/3 (GAUZE/BANDAGES/DRESSINGS) ×2 IMPLANT
BLADE SURG ROTATE 9660 (MISCELLANEOUS) IMPLANT
CANISTER SUCTION 2500CC (MISCELLANEOUS) ×2 IMPLANT
CHLORAPREP W/TINT 26ML (MISCELLANEOUS) ×2 IMPLANT
CLOTH BEACON ORANGE TIMEOUT ST (SAFETY) ×2 IMPLANT
COVER SURGICAL LIGHT HANDLE (MISCELLANEOUS) ×2 IMPLANT
DEVICE TROCAR PUNCTURE CLOSURE (ENDOMECHANICALS) ×2 IMPLANT
DISSECTOR BLUNT TIP ENDO 5MM (MISCELLANEOUS) IMPLANT
DRAPE INCISE IOBAN 66X45 STRL (DRAPES) ×2 IMPLANT
ELECT REM PT RETURN 9FT ADLT (ELECTROSURGICAL) ×2
ELECTRODE REM PT RTRN 9FT ADLT (ELECTROSURGICAL) ×1 IMPLANT
GLOVE BIO SURGEON STRL SZ 6.5 (GLOVE) ×2 IMPLANT
GLOVE BIO SURGEON STRL SZ7 (GLOVE) ×2 IMPLANT
GLOVE BIO SURGEON STRL SZ8 (GLOVE) ×2 IMPLANT
GLOVE BIOGEL PI IND STRL 6.5 (GLOVE) ×1 IMPLANT
GLOVE BIOGEL PI IND STRL 8 (GLOVE) ×1 IMPLANT
GLOVE BIOGEL PI IND STRL 8.5 (GLOVE) ×1 IMPLANT
GLOVE BIOGEL PI INDICATOR 6.5 (GLOVE) ×1
GLOVE BIOGEL PI INDICATOR 8 (GLOVE) ×1
GLOVE BIOGEL PI INDICATOR 8.5 (GLOVE) ×1
GLOVE ECLIPSE 8.0 STRL XLNG CF (GLOVE) ×2 IMPLANT
GOWN STRL NON-REIN LRG LVL3 (GOWN DISPOSABLE) ×8 IMPLANT
KIT BASIN OR (CUSTOM PROCEDURE TRAY) ×2 IMPLANT
KIT ROOM TURNOVER OR (KITS) ×2 IMPLANT
MESH PARIETEX 20X15 (Mesh General) ×2 IMPLANT
NEEDLE SPNL 22GX3.5 QUINCKE BK (NEEDLE) ×2 IMPLANT
NS IRRIG 1000ML POUR BTL (IV SOLUTION) ×2 IMPLANT
PAD ARMBOARD 7.5X6 YLW CONV (MISCELLANEOUS) ×4 IMPLANT
PEN SKIN MARKING BROAD (MISCELLANEOUS) ×2 IMPLANT
POUCH SPECIMEN RETRIEVAL 10MM (ENDOMECHANICALS) ×2 IMPLANT
SCALPEL HARMONIC ACE (MISCELLANEOUS) ×2 IMPLANT
SCISSORS LAP 5X35 DISP (ENDOMECHANICALS) ×2 IMPLANT
SET IRRIG TUBING LAPAROSCOPIC (IRRIGATION / IRRIGATOR) IMPLANT
SLEEVE ENDOPATH XCEL 5M (ENDOMECHANICALS) ×4 IMPLANT
SUT MON AB 4-0 PC3 18 (SUTURE) ×2 IMPLANT
SUT NOVA NAB DX-16 0-1 5-0 T12 (SUTURE) ×4 IMPLANT
SUT VICRYL 0 TIES 12 18 (SUTURE) IMPLANT
SUT VICRYL 0 UR6 27IN ABS (SUTURE) IMPLANT
TACKER 5MM HERNIA 3.5CML NAB (ENDOMECHANICALS) ×4 IMPLANT
TOWEL OR 17X24 6PK STRL BLUE (TOWEL DISPOSABLE) ×2 IMPLANT
TOWEL OR 17X26 10 PK STRL BLUE (TOWEL DISPOSABLE) ×2 IMPLANT
TRAY FOLEY CATH 14FRSI W/METER (CATHETERS) ×2 IMPLANT
TRAY LAPAROSCOPIC (CUSTOM PROCEDURE TRAY) ×2 IMPLANT
TROCAR XCEL NON-BLD 11X100MML (ENDOMECHANICALS) ×2 IMPLANT
TROCAR XCEL NON-BLD 5MMX100MML (ENDOMECHANICALS) ×2 IMPLANT
WATER STERILE IRR 1000ML POUR (IV SOLUTION) IMPLANT

## 2012-08-04 NOTE — Anesthesia Postprocedure Evaluation (Signed)
Anesthesia Post Note  Patient: [name]  Procedure(s) Performed: Procedure(s) (LRB): LAPAROSCOPIC VENTRAL HERNIA (N/A) INSERTION OF MESH (N/A)  Anesthesia type: General  Patient location: PACU  Post pain: Pain level controlled and Adequate analgesia  Post assessment: Post-op Vital signs reviewed, Patient's Cardiovascular Status Stable, Respiratory Function Stable, Patent Airway and Pain level controlled  Last Vitals:  Filed Vitals:   08/04/12 1434  BP:   Pulse:   Temp:   Resp: 17    Post vital signs: Reviewed and stable  Level of consciousness: awake, alert  and oriented  Complications: No apparent anesthesia complications

## 2012-08-04 NOTE — Anesthesia Preprocedure Evaluation (Signed)
Anesthesia Evaluation  Patient identified by MRN, date of birth, ID band Patient awake    Reviewed: Allergy & Precautions, H&P , NPO status , Patient's Chart, lab work & pertinent test results  History of Anesthesia Complications Negative for: history of anesthetic complications  Airway Mallampati: II TM Distance: >3 FB Neck ROM: Full    Dental No notable dental hx. (+) Teeth Intact and Dental Advisory Given   Pulmonary neg pulmonary ROS,  breath sounds clear to auscultation  Pulmonary exam normal       Cardiovascular negative cardio ROS  Rhythm:Regular Rate:Normal     Neuro/Psych PSYCHIATRIC DISORDERS Anxiety negative neurological ROS     GI/Hepatic hiatal hernia, GERD- (esophageal stricture-s/p dilation)  Medicated and Controlled,(+) Hepatitis - (trreated with interferon), C  Endo/Other  negative endocrine ROS  Renal/GU negative Renal ROS     Musculoskeletal   Abdominal (+) + obese,   Peds  Hematology   Anesthesia Other Findings   Reproductive/Obstetrics                           Anesthesia Physical Anesthesia Plan  ASA: II  Anesthesia Plan: General   Post-op Pain Management:    Induction: Intravenous  Airway Management Planned: Oral ETT  Additional Equipment:   Intra-op Plan:   Post-operative Plan: Extubation in OR  Informed Consent: I have reviewed the patients History and Physical, chart, labs and discussed the procedure including the risks, benefits and alternatives for the proposed anesthesia with the patient or authorized representative who has indicated his/her understanding and acceptance.   Dental advisory given  Plan Discussed with: CRNA and Surgeon  Anesthesia Plan Comments: (Plan routine monitors, GETA)        Anesthesia Quick Evaluation

## 2012-08-04 NOTE — Brief Op Note (Signed)
08/04/2012  2:18 PM  PATIENT:  [name]  56 y.o. female  PRE-OPERATIVE DIAGNOSIS:  Ventral incisonal hernia  POST-OPERATIVE DIAGNOSIS:  Same with intraabdominal adhesions  PROCEDURE:    Laparoscopic lysis of adhesions (45 minutes) and laparoscopic ventrah hernia repair with mesh. SURGEON:  Surgeon(s) and Role:    * Adolph Pollack, MD - Primary    * Thomas A. Cornett, MD - Assisting  PHYSICIAN ASSISTANT:   E. Hanes, PA-S  ANESTHESIA:   general  EBL:  Total I/O In: 1600 [I.V.:1600] Out: 110 [Urine:100; Blood:10]  BLOOD ADMINISTERED:none  DRAINS: none   LOCAL MEDICATIONS USED:  MARCAINE     SPECIMEN:  No Specimen  DISPOSITION OF SPECIMEN:  N/A  COUNTS:  YES  TOURNIQUET:  * No tourniquets in log *  DICTATION: .Dragon Dictation  PLAN OF CARE: Admit to inpatient   PATIENT DISPOSITION:  PACU - hemodynamically stable.   Delay start of Pharmacological VTE agent (>24hrs) due to surgical blood loss or risk of bleeding: yes

## 2012-08-04 NOTE — Preoperative (Signed)
Beta Blockers   Reason not to administer Beta Blockers:Not Applicable 

## 2012-08-04 NOTE — Op Note (Signed)
Operative Note  [name] female 56 y.o. 08/04/2012  PREOPERATIVE DX:  Recurrent ventral incisional hernia  POSTOPERATIVE DX:  Same  PROCEDURE:Laparoscopic lysis of adhesions (45 minutes), laparoscopic repair of ventral incisional hernia with mesh (Parietex composite).         Surgeon: Adolph Pollack   Assistants: Harriette Bouillon  Anesthesia: General endotracheal anesthesia  Indications: This is a 56 year old active female who is in multiple abdominal operations. The last operation was a subtotal colectomy with primary repair of small epigastric ventral hernia.  She has recurrence of the hernia and some hernias inferior to this area and she now presents for repair.    Procedure Detail:  She was seen in the holding room. She is brought to the operating room placed supine on the operating table and a general anesthetic was given. The abdominal wall was then widely sterilely prepped and draped after a Foley catheter and oral gastric tube were placed.  She was placed in slight reverse Trendelenburg position. A 5 mm left subcostal incision was made. Using a 5 mm Optiview trocar and laparoscope access was gained into the peritoneal cavity. A pneumoperitoneum was created. I visualized the area beneath the trocar and there is no evidence of bleeding or organ injury. There were adhesions between the omentum and anterior abdominal wall in the midline area. There is also an adhesion between the small bowel and one area of the hernia. Filmy adhesions were divided sharply. Thicker omental adhesions were divided with the harmonic scalpel. The omentum and small bowel were reduced from the hernias.  Some of the omentum in the hernia sac was removed and sent to pathology.  The lyses of adhesions took approximately 45 minutes. Inspection of the small bowel and omentum demonstrated no evidence of intestinal injury or bleeding from the omentum.  Using a spinal needle, the periphery of the hernias in  the epigastric region was marked and then 4 cm measured away from this. A piece of 20 cm x 15 cm mesh was brought into the field which would allow for adequate repair and overlap. Age anchoring sutures of #1 Novofil were placed around the periphery of the mesh. The mesh was then hydrated and then placed into the abdominal cavity and deployed so that the non-adherent side was facing the viscera and the rough side was facing the anterior abdominal wall.  8 small incisions were then placed in the abdominal wall and the anchoring sutures were brought up across the fascial ridge and tied down anchoring the mesh to the abdominal wall. Using a spiral tacker device the mesh was further anchored to the abdominal wall. This provided for adequate coverage of the hernia defect with good overlap.  A four-quadrant and central abdominal inspection was then performed. There is no evidence of  bleeding or intestinal injury.  The pneumoperitoneum was then released and I watched the mesh approximated the viscera.  All trocars were removed.  The skin incisions were then closed with 4-0 Monocryl subcuticular stitches followed by Steri-Strips and sterile dressings.  She tolerated the procedure well without any apparent complications and was taken to the recovery in satisfactory condition.  Estimated Blood Loss:  less than 100 mL         Drains: none         Blood Given: none          Specimens: hernia content        Complications:  * No complications entered in OR log *  Disposition: PACU - hemodynamically stable.         Condition: stable

## 2012-08-04 NOTE — Interval H&P Note (Signed)
History and Physical Interval Note:  08/04/2012 12:05 PM  [name]  has presented today for surgery, with the diagnosis of ventral hernia  The various methods of treatment have been discussed with the patient and family. After consideration of risks, benefits and other options for treatment, the patient has consented to  Procedure(s) (LRB) with comments: LAPAROSCOPIC VENTRAL HERNIA (N/A) - laparoscopic possible open ventral hernia repair with mesh INSERTION OF MESH (N/A) as a surgical intervention .  The patient's history has been reviewed, patient examined, no change in status, stable for surgery.  I have reviewed the patient's chart and labs.  Questions were answered to the patient's satisfaction.     Carmela Piechowski Shela Commons

## 2012-08-04 NOTE — H&P (Signed)
[name] is an 56 y.o. female.   Chief Complaint:   Here for elective surgery. HPI: She has had multiple abdominal operations and has developed a symptomatic incisional hernia.  She present for elective repair.  Past Medical History  Diagnosis Date  . Hepatitis C   . Arthritis   . GERD (gastroesophageal reflux disease)   . Diverticulitis large intestine   . Anxiety   . 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 H/O hiatal hernia   . Insomnia   . Frequency   . Urination, excessive at night     Past Surgical History  Procedure Date  . Cholecystectomy   . Appendectomy   . Abdominal surgery   . Abdominal hysterectomy   . Colon surgery     subtotal colectomy  . Hernia repair   . Ureathal cyst   . Ectopic pregnancy surgery   . Leg tendon surgery     Family History  Problem Relation Age of Onset  . Colon cancer Maternal Uncle   . Colon polyps Neg Hx   . Prostate cancer Neg Hx   . Rectal cancer Neg Hx    Social History:  reports that she has never smoked. She has never used smokeless tobacco. She reports that she drinks about 1.2 ounces of alcohol per week. She reports that she does not use illicit drugs.  Allergies: No Known Allergies  Medications Prior to Admission  Medication Sig Dispense Refill  . clonazePAM (KLONOPIN) 0.5 MG tablet Take 0.5 mg by mouth at bedtime as needed. For anxiety.      . cyclobenzaprine (FLEXERIL) 10 MG tablet Take 10 mg by mouth 2 (two) times daily as needed. For muscle spasms.      Marland Kitchen omeprazole (PRILOSEC) 20 MG capsule Take 1 capsule (20 mg total) by mouth daily.  90 capsule  3  . traMADol (ULTRAM) 50 MG tablet Take 50 mg by mouth every 8 (eight) hours as needed. For pain.        No results found for this or any previous visit (from the past 48 hour(s)). No results found.  Review of Systems  Constitutional: Negative for fever and chills.  HENT: Negative for sore throat.   Respiratory: Negative for  cough.   Gastrointestinal: Negative for nausea, vomiting and diarrhea.    Blood pressure 121/78, pulse 80, temperature 97.8 F (36.6 C), temperature source Oral, resp. rate 20, SpO2 97.00%. Physical Exam  Constitutional: She appears well-developed and well-nourished. No distress.  HENT:  Head: Normocephalic and atraumatic.  Neck: Neck supple.  Cardiovascular: Normal rate and regular rhythm.   Respiratory: Effort normal and breath sounds normal.  GI: Soft. She exhibits no mass. There is no tenderness.       Long midline scar with periumbilical and epigastric fascial defects consistent with hernias.  Lymphadenopathy:    She has no cervical adenopathy.     Assessment/Plan Ventral incisional hernia  Plan:  Laparoscopic, possible open repair.  The procedure, risks, and aftercare have been discussed with her.  Toinette Lackie J 08/04/2012, 12:02 PM

## 2012-08-04 NOTE — Transfer of Care (Signed)
Immediate Anesthesia Transfer of Care Note  Patient: [name]  Procedure(s) Performed: Procedure(s) (LRB) with comments: LAPAROSCOPIC VENTRAL HERNIA (N/A) - laparoscopic possible open ventral hernia repair with mesh INSERTION OF MESH (N/A)  Patient Location: PACU  Anesthesia Type: General  Level of Consciousness: awake, alert  and oriented  Airway & Oxygen Therapy: Patient Spontanous Breathing and Patient connected to face mask oxygen  Post-op Assessment: Report given to PACU RN  Post vital signs: Reviewed and stable  Complications: No apparent anesthesia complications

## 2012-08-04 NOTE — Progress Notes (Signed)
ARRIVED FROM PACE TO ROOM #19. MOVED SELF FROM STRETCHER TO BED WITHOUT DIFFICULTY. DENIES NAUSEA,C/O ABD PAIN 5/10, ENCOURAGED TO USE PCA WHEN NEEDED. FAMILY AT BEDSIDE

## 2012-08-05 ENCOUNTER — Other Ambulatory Visit (HOSPITAL_COMMUNITY): Payer: BC Managed Care – PPO

## 2012-08-05 ENCOUNTER — Encounter (HOSPITAL_COMMUNITY): Payer: Self-pay | Admitting: General Surgery

## 2012-08-05 MED ORDER — ONDANSETRON HCL 4 MG PO TABS: 4.0000 mg | ORAL_TABLET | Freq: Four times a day (QID) | ORAL | Status: DC | PRN

## 2012-08-05 MED ORDER — ALUM & MAG HYDROXIDE-SIMETH 200-200-20 MG/5ML PO SUSP
30.0000 mL | Freq: Four times a day (QID) | ORAL | Status: DC | PRN
  Administered 2012-08-05 – 2012-08-07 (×2): 30 mL via ORAL
  Filled 2012-08-05 (×3): qty 30

## 2012-08-05 MED ORDER — NALOXONE HCL 0.4 MG/ML IJ SOLN: 0.4000 mg | INTRAMUSCULAR | Status: DC | PRN

## 2012-08-05 MED ORDER — HYDROMORPHONE 0.3 MG/ML IV SOLN
INTRAVENOUS | Status: DC
  Administered 2012-08-05: 1.2 mg via INTRAVENOUS
  Administered 2012-08-05: 1.8 mg via INTRAVENOUS
  Administered 2012-08-05: 0.3 mg via INTRAVENOUS
  Administered 2012-08-05: 0.6 mg via INTRAVENOUS
  Administered 2012-08-05: 09:00:00 via INTRAVENOUS
  Administered 2012-08-06: 0.6 mg via INTRAVENOUS
  Administered 2012-08-06: 18:00:00 via INTRAVENOUS
  Administered 2012-08-06: 0.9 mg via INTRAVENOUS
  Administered 2012-08-06: 0.3 mg via INTRAVENOUS
  Administered 2012-08-06 – 2012-08-07 (×5): 0.6 mg via INTRAVENOUS
  Filled 2012-08-05 (×2): qty 25

## 2012-08-05 MED ORDER — DIPHENHYDRAMINE HCL 50 MG/ML IJ SOLN: 12.5000 mg | Freq: Four times a day (QID) | INTRAMUSCULAR | Status: DC | PRN

## 2012-08-05 MED ORDER — KETOROLAC TROMETHAMINE 30 MG/ML IJ SOLN
30.0000 mg | Freq: Four times a day (QID) | INTRAMUSCULAR | Status: AC
Start: 2012-08-05 — End: 2012-08-07
  Administered 2012-08-05 – 2012-08-06 (×5): 30 mg via INTRAVENOUS
  Filled 2012-08-05 (×9): qty 1

## 2012-08-05 MED ORDER — DIPHENHYDRAMINE HCL 12.5 MG/5ML PO ELIX
12.5000 mg | ORAL_SOLUTION | Freq: Four times a day (QID) | ORAL | Status: DC | PRN
  Filled 2012-08-05: qty 5

## 2012-08-05 MED ORDER — ONDANSETRON HCL 4 MG/2ML IJ SOLN
4.0000 mg | INTRAMUSCULAR | Status: DC | PRN
  Administered 2012-08-05 – 2012-08-06 (×3): 4 mg via INTRAVENOUS
  Filled 2012-08-05: qty 2

## 2012-08-05 MED ORDER — ENOXAPARIN SODIUM 40 MG/0.4ML ~~LOC~~ SOLN
40.0000 mg | SUBCUTANEOUS | Status: DC
  Administered 2012-08-05 – 2012-08-07 (×3): 40 mg via SUBCUTANEOUS
  Filled 2012-08-05 (×4): qty 0.4

## 2012-08-05 MED ORDER — ONDANSETRON HCL 4 MG/2ML IJ SOLN
4.0000 mg | Freq: Four times a day (QID) | INTRAMUSCULAR | Status: DC | PRN
  Administered 2012-08-06: 4 mg via INTRAVENOUS
  Filled 2012-08-05 (×3): qty 2

## 2012-08-05 MED ORDER — SODIUM CHLORIDE 0.9 % IJ SOLN: 9.0000 mL | INTRAMUSCULAR | Status: DC | PRN

## 2012-08-05 NOTE — Progress Notes (Signed)
1 Day Post-Op  Subjective: Having a lot of pain and nausea.  She feels the Morphine is causing her nausea.  Objective: Vital signs in last 24 hours: Temp:  [97.5 F (36.4 C)-98.7 F (37.1 C)] 98.7 F (37.1 C) (09/04 0530) Pulse Rate:  [68-108] 108  (09/04 0530) Resp:  [8-23] 14  (09/04 0610) BP: (108-165)/(57-98) 108/57 mmHg (09/04 0530) SpO2:  [94 %-100 %] 97 % (09/04 0610) Weight:  [170 lb (77.111 kg)] 170 lb (77.111 kg) (09/03 1852)    Intake/Output from previous day: 09/03 0701 - 09/04 0700 In: 1600 [I.V.:1600] Out: 970 [Urine:960; Blood:10] Intake/Output this shift:    PE: Abd-soft, dressings dry, hypoactive bowel sounds  Lab Results:  No results found for this basename: WBC:2,HGB:2,HCT:2,PLT:2 in the last 72 hours BMET No results found for this basename: NA:2,K:2,CL:2,CO2:2,GLUCOSE:2,BUN:2,CREATININE:2,CALCIUM:2 in the last 72 hours PT/INR No results found for this basename: LABPROT:2,INR:2 in the last 72 hours Comprehensive Metabolic Panel:    Component Value Date/Time   NA 140 07/30/2012 1349   K 4.2 07/30/2012 1349   CL 105 07/30/2012 1349   CO2 25 07/30/2012 1349   BUN 15 07/30/2012 1349   CREATININE 0.76 07/30/2012 1349   GLUCOSE 98 07/30/2012 1349   CALCIUM 9.7 07/30/2012 1349   AST 17 07/30/2012 1349   ALT 24 07/30/2012 1349   ALKPHOS 55 07/30/2012 1349   BILITOT 0.2* 07/30/2012 1349   PROT 7.4 07/30/2012 1349   ALBUMIN 4.2 07/30/2012 1349     Studies/Results: No results found.  Anti-infectives: Anti-infectives     Start     Dose/Rate Route Frequency Ordered Stop   08/04/12 1830   ceFAZolin (ANCEF) IVPB 1 g/50 mL premix        1 g 100 mL/hr over 30 Minutes Intravenous Every 6 hours 08/04/12 1638 08/05/12 0638   08/03/12 1432   ceFAZolin (ANCEF) IVPB 2 g/50 mL premix        2 g 100 mL/hr over 30 Minutes Intravenous 60 min pre-op 08/03/12 1432 08/04/12 1224          Assessment Active Problems:  Recurrent ventral incisional hernia s/p lap repair  with mesh 9/3-having problems with pain control and nausea.    LOS: 1 day   Plan: Switch to Dilaudid PCA and add Toradol.  Start Lovenox.  OOB when more comfortable.   Larose Batres J 08/05/2012

## 2012-08-05 NOTE — Progress Notes (Signed)
Pt demanding that foley be removed. New order to DC foley per Dr. Lindie Spruce.

## 2012-08-05 NOTE — Progress Notes (Signed)
Pt. stated pain not less than 4-5 most of night,would like to talk to MD concerning options.

## 2012-08-06 MED ORDER — WHITE PETROLATUM GEL
Status: AC
  Administered 2012-08-06: 12:00:00
  Filled 2012-08-06: qty 5

## 2012-08-06 MED ORDER — ACETAMINOPHEN 325 MG PO TABS: 650.0000 mg | ORAL_TABLET | Freq: Four times a day (QID) | ORAL | Status: DC | PRN

## 2012-08-06 NOTE — Progress Notes (Signed)
2 Days Post-Op  Subjective: Less sore today.  Nausea better.  No flatus or BM.  Sat in chair yesterday.  Has a headache.  Objective: Vital signs in last 24 hours: Temp:  [97.5 F (36.4 C)-98.6 F (37 C)] 98 F (36.7 C) (09/05 0635) Pulse Rate:  [80-110] 80  (09/05 0635) Resp:  [15-27] 16  (09/05 0811) BP: (131-166)/(56-65) 133/63 mmHg (09/05 0635) SpO2:  [91 %-96 %] 96 % (09/05 0811) Last BM Date: 07/31/12  Intake/Output from previous day: 09/04 0701 - 09/05 0700 In: 1611.7 [I.V.:1611.7] Out: 600 [Urine:600] Intake/Output this shift:    PE: Abd-soft, dressings dry, still with  hypoactive bowel sounds  Lab Results:  No results found for this basename: WBC:2,HGB:2,HCT:2,PLT:2 in the last 72 hours BMET No results found for this basename: NA:2,K:2,CL:2,CO2:2,GLUCOSE:2,BUN:2,CREATININE:2,CALCIUM:2 in the last 72 hours PT/INR No results found for this basename: LABPROT:2,INR:2 in the last 72 hours Comprehensive Metabolic Panel:    Component Value Date/Time   NA 140 07/30/2012 1349   K 4.2 07/30/2012 1349   CL 105 07/30/2012 1349   CO2 25 07/30/2012 1349   BUN 15 07/30/2012 1349   CREATININE 0.76 07/30/2012 1349   GLUCOSE 98 07/30/2012 1349   CALCIUM 9.7 07/30/2012 1349   AST 17 07/30/2012 1349   ALT 24 07/30/2012 1349   ALKPHOS 55 07/30/2012 1349   BILITOT 0.2* 07/30/2012 1349   PROT 7.4 07/30/2012 1349   ALBUMIN 4.2 07/30/2012 1349     Studies/Results: No results found.  Anti-infectives: Anti-infectives     Start     Dose/Rate Route Frequency Ordered Stop   08/04/12 1830   ceFAZolin (ANCEF) IVPB 1 g/50 mL premix        1 g 100 mL/hr over 30 Minutes Intravenous Every 6 hours 08/04/12 1638 08/05/12 0638   08/03/12 1432   ceFAZolin (ANCEF) IVPB 2 g/50 mL premix        2 g 100 mL/hr over 30 Minutes Intravenous 60 min pre-op 08/03/12 1432 08/04/12 1224          Assessment Active Problems:  Recurrent ventral incisional hernia s/p lap repair with mesh 9/3-pain control  better.    LOS: 2 days   Plan: Advance diet.  Ambulate.  Tylenol for headache.   Benjiman Sedgwick J 08/06/2012

## 2012-08-07 MED ORDER — HYDROMORPHONE HCL PF 1 MG/ML IJ SOLN
1.0000 mg | INTRAMUSCULAR | Status: DC | PRN
  Administered 2012-08-07 (×3): 1 mg via INTRAVENOUS
  Filled 2012-08-07 (×3): qty 1

## 2012-08-07 MED ORDER — OXYCODONE-ACETAMINOPHEN 5-325 MG PO TABS
1.0000 | ORAL_TABLET | ORAL | Status: DC | PRN
  Administered 2012-08-07 (×2): 1 via ORAL
  Administered 2012-08-08: 2 via ORAL
  Administered 2012-08-08: 1 via ORAL
  Administered 2012-08-08: 2 via ORAL
  Filled 2012-08-07: qty 2
  Filled 2012-08-07 (×2): qty 1
  Filled 2012-08-07 (×2): qty 2

## 2012-08-07 MED ORDER — OXYCODONE-ACETAMINOPHEN 5-325 MG PO TABS: 1.0000 | ORAL_TABLET | ORAL | Status: AC | PRN

## 2012-08-07 MED ORDER — MAGNESIUM HYDROXIDE 400 MG/5ML PO SUSP
30.0000 mL | Freq: Two times a day (BID) | ORAL | Status: DC
  Administered 2012-08-07: 30 mL via ORAL
  Filled 2012-08-07 (×3): qty 30

## 2012-08-07 NOTE — Progress Notes (Signed)
Cosign for [employer] RN assessment, I/O, note(s), med administration, and care plan/education.  Lorretta Harp RN

## 2012-08-07 NOTE — Progress Notes (Signed)
3 Days Post-Op  Subjective: "I feel better." Has been up and walking. She tolerated her full liquid breakfast. No nausea. No BM or flatus. Not using much pain medicine.   Objective: Vital signs in last 24 hours: Temp:  [97.5 F (36.4 C)-98.6 F (37 C)] 97.5 F (36.4 C) (09/06 8469) Pulse Rate:  [68-92] 91  (09/06 0633) Resp:  [16-24] 17  (09/06 0826) BP: (120-144)/(58-76) 134/76 mmHg (09/06 0633) SpO2:  [94 %-98 %] 96 % (09/06 0826) Last BM Date: 07/31/12  Intake/Output from previous day: 09/05 0701 - 09/06 0700 In: 1463.8 [P.O.:240; I.V.:1223.8] Out: -  Intake/Output this shift: Total I/O In: 120 [P.O.:120] Out: -   PE: Abd- soft, incisions clean, dry and intact; bowel sounds are hypoactive  Lab Results:  No results found for this basename: WBC:2,HGB:2,HCT:2,PLT:2 in the last 72 hours BMET No results found for this basename: NA:2,K:2,CL:2,CO2:2,GLUCOSE:2,BUN:2,CREATININE:2,CALCIUM:2 in the last 72 hours PT/INR No results found for this basename: LABPROT:2,INR:2 in the last 72 hours Comprehensive Metabolic Panel:    Component Value Date/Time   NA 140 07/30/2012 1349   K 4.2 07/30/2012 1349   CL 105 07/30/2012 1349   CO2 25 07/30/2012 1349   BUN 15 07/30/2012 1349   CREATININE 0.76 07/30/2012 1349   GLUCOSE 98 07/30/2012 1349   CALCIUM 9.7 07/30/2012 1349   AST 17 07/30/2012 1349   ALT 24 07/30/2012 1349   ALKPHOS 55 07/30/2012 1349   BILITOT 0.2* 07/30/2012 1349   PROT 7.4 07/30/2012 1349   ALBUMIN 4.2 07/30/2012 1349     Studies/Results: No results found.  Anti-infectives: Anti-infectives     Start     Dose/Rate Route Frequency Ordered Stop   08/04/12 1830   ceFAZolin (ANCEF) IVPB 1 g/50 mL premix        1 g 100 mL/hr over 30 Minutes Intravenous Every 6 hours 08/04/12 1638 08/05/12 0638   08/03/12 1432   ceFAZolin (ANCEF) IVPB 2 g/50 mL premix        2 g 100 mL/hr over 30 Minutes Intravenous 60 min pre-op 08/03/12 1432 08/04/12 1224           Assessment Active Problems:  Recurrent ventral incisional hernia s/p lap repair with mesh 9/3- Doing better overall today.     LOS: 3 days   Plan: Solid diet. Discontinue PCA and start oral analgesic. Milk of Magnesia. Hopefully home tomorrow. Discharge instructions were discussed with her.    Kenli Waldo J 08/07/2012

## 2012-08-08 NOTE — Discharge Planning (Signed)
Patient discharged home in stable condition. Verbalizes understanding of all discharge instructions, including home medications and follow up appointments. 

## 2012-08-08 NOTE — Progress Notes (Signed)
4 Days Post-Op  Subjective: She feels well and able to go home. Pain well controlled. Tolerating diet. Ambulating.  Objective: Vital signs in last 24 hours: Temp:  [97.5 F (36.4 C)-98.3 F (36.8 C)] 97.5 F (36.4 C) (09/07 0525) Pulse Rate:  [71-93] 78  (09/07 0525) Resp:  [15-18] 16  (09/07 0525) BP: (110-135)/(49-68) 126/57 mmHg (09/07 0525) SpO2:  [92 %-96 %] 96 % (09/07 0525)   Intake/Output from previous day: 09/06 0701 - 09/07 0700 In: 1020.8 [P.O.:480; I.V.:540.8] Out: 4 [Urine:2; Stool:2] Intake/Output this shift:     General appearance: alert, cooperative and no distress Resp: clear to auscultation bilaterally Cardio: regular rate and rhythm, S1, S2 normal, no murmur, click, rub or gallop GI: soft and benign. Not distended. Bowel sounds present.  Incision: healing well, no significant drainage  Lab Results:  No results found for this basename: WBC:2,HGB:2,HCT:2,PLT:2 in the last 72 hours BMET No results found for this basename: NA:2,K:2,CL:2,CO2:2,GLUCOSE:2,BUN:2,CREATININE:2,CALCIUM:2 in the last 72 hours PT/INR No results found for this basename: LABPROT:2,INR:2 in the last 72 hours ABG No results found for this basename: PHART:2,PCO2:2,PO2:2,HCO3:2 in the last 72 hours  MEDS, Scheduled    . enoxaparin (LOVENOX) injection  40 mg Subcutaneous Q24H  . magnesium hydroxide  30 mL Oral BID  . pantoprazole  40 mg Oral Q1200    Studies/Results: No results found.  Assessment: s/p Procedure(s): LAPAROSCOPIC VENTRAL HERNIA INSERTION OF MESH Patient Active Problem List  Diagnosis  . OTHER ANXIETY STATES  . ADD  . HEMORRHAGE OF RECTUM AND ANUS  . ABDOMINAL PAIN-MULTIPLE SITES  . DIVERTICULITIS, HX OF  . Recurrent ventral incisional hernia s/p lap repair with mesh 9/3.  Marland Kitchen Stricture and stenosis of esophagus    Doing well and able to be discharged  Plan: Discharge   LOS: 4 days     Currie Paris, MD, [facility],  [place] [phone]   08/08/2012 9:52 AM

## 2012-08-14 ENCOUNTER — Telehealth (INDEPENDENT_AMBULATORY_CARE_PROVIDER_SITE_OTHER): Payer: Self-pay | Admitting: General Surgery

## 2012-08-14 NOTE — Telephone Encounter (Signed)
Pt called for refill of pain meds; will run out over weekend.  Hydrocodone 5/325 mg, # 30, 1-2 po Q4-6H prn pain no refill called to [employer] [facility]:  [phone].

## 2012-08-17 NOTE — Discharge Summary (Signed)
Physician Discharge Summary  Patient ID: [name] MRN: [id number] DOB/AGE: 05/05/1956 56 y.o.  Admit date: 08/04/2012 Discharge date: 08/08/2012  Admission Diagnoses:  Recurrent ventral incisional hernia  Discharge Diagnoses:  Active Problems:  Recurrent ventral incisional hernia s/p lap repair with mesh 9/3.   Discharged Condition: good  Hospital Course: She was admitted and underwent the above procedure on 08/04/2012. Postoperatively she needed IV narcotics for pain control and had a mild ileus which eventually resolved. By the day of her discharge she is tolerating a diet, bowel function was going to resume, and she was more comfortable with oral analgesic pain management. She is given specific discharge instructions as well.  Consults: None  Significant Diagnostic Studies: None  Treatments: surgery: Laparoscopic repair of recurrent ventral incisional hernia with mesh  Discharge Exam: Blood pressure 126/57, pulse 78, temperature 97.5 F (36.4 C), temperature source Oral, resp. rate 16, height 5\' 7"  (1.702 m), weight 170 lb (77.111 kg), SpO2 96.00%.   Disposition: 01-Home or Self Care  Discharge Orders    Future Appointments: Provider: Department: Dept Phone: Center:   08/26/2012 11:40 AM Adolph Pollack, MD [facility] [phone] None     Future Orders Please Complete By Expires   Diet - low sodium heart healthy      Increase activity slowly      Discharge instructions      Comments:   [facility] LAPAROSCOPIC SURGERY: POST OP INSTRUCTIONS Always review your discharge instruction sheet given to you by the facility where your surgery was performed. IF YOU HAVE DISABILITY OR FAMILY LEAVE FORMS, YOU MUST BRING THEM TO THE OFFICE FOR PROCESSING.   DO NOT GIVE THEM TO YOUR DOCTOR.  A prescription for pain medication may be given to you upon discharge.  Take your pain medication as prescribed, if needed.  If narcotic pain medicine is  not needed, then you may take acetaminophen (Tylenol) or ibuprofen (Advil) as needed. Take your usually prescribed medications unless otherwise directed. If you need a refill on your pain medication, please contact your pharmacy.  They will contact our office to request authorization. Prescriptions will not be filled after 5pm or on week-ends. You should follow a light diet the first few days after arrival home, such as soup and crackers, etc.  Be sure to include lots of fluids daily. Most patients will experience some swelling and bruising in the area of the incisions.  Ice packs will help.  Swelling and bruising can take several days to resolve.  It is common to experience some constipation if taking pain medication after surgery.  Increasing fluid intake and taking a stool softener (such as Colace) will usually help or prevent this problem from occurring.  A mild laxative (Milk of Magnesia or Miralax) should be taken according to package instructions if there are no bowel movements after 48 hours. Unless discharge instructions indicate otherwise, you may remove your bandages 24-48 hours after surgery, and you may shower at that time.  You may have steri-strips (small skin tapes) in place directly over the incision.  These strips should be left on the skin for 7-10 days.  If your surgeon used skin glue on the incision, you may shower in 24 hours.  The glue will flake off over the next 2-3 weeks.  Any sutures or staples will be removed at the office during your follow-up visit. ACTIVITIES:  You may resume regular (light) daily activities beginning the next day-such as daily self-care, walking, climbing  stairs-gradually increasing activities as tolerated.  You may have sexual intercourse when it is comfortable.  Refrain from any heavy lifting or straining until approved by your doctor. You may drive when you are no longer taking prescription pain medication, you can comfortably wear a seatbelt, and you can  safely maneuver your car and apply brakes. RETURN TO WORK:  __________________________________________________________ Bonita Quin should see your doctor in the office for a follow-up appointment approximately 2-3 weeks after your surgery.  Make sure that you call for this appointment within a day or two after you arrive home to insure a convenient appointment time. OTHER INSTRUCTIONS: __________________________________________________________________________________________________________________________ __________________________________________________________________________________________________________________________ WHEN TO CALL YOUR DOCTOR: Fever over 101.0 Inability to urinate Continued bleeding from incision. Increased pain, redness, or drainage from the incision. Increasing abdominal pain  The clinic staff is available to answer your questions during regular business hours.  Please don't hesitate to call and ask to speak to one of the nurses for clinical concerns.  If you have a medical emergency, go to the nearest emergency room or call 911.  A surgeon from [facility] is always on call at the hospital. [address], [address], [place], [place]  [address] ? [address] [address], [place], [place]   [address] [phone] ? [phone] ? FAX [phone] Web site: [email or web address]   No dressing needed          Medication List     As of 08/17/2012  3:22 PM    TAKE these medications         clonazePAM 0.5 MG tablet   Commonly known as: KLONOPIN   Take 0.5 mg by mouth at bedtime as needed. For anxiety.      cyclobenzaprine 10 MG tablet   Commonly known as: FLEXERIL   Take 10 mg by mouth 2 (two) times daily as needed. For muscle spasms.      omeprazole 20 MG capsule   Commonly known as: PRILOSEC   Take 1 capsule (20 mg total) by mouth daily.      oxyCODONE-acetaminophen 5-325 MG per tablet   Commonly known as: PERCOCET/ROXICET   Take 1-2 tablets by  mouth every 4 (four) hours as needed.      traMADol 50 MG tablet   Commonly known as: ULTRAM   Take 50 mg by mouth every 8 (eight) hours as needed. For pain.           Follow-up Information    Follow up with Adela Esteban J, MD. Schedule an appointment as soon as possible for a visit in 2 weeks.   Contact information:   [address] [place] [address] [phone]          Signed: Adolph Pollack 08/17/2012, 3:22 PM

## 2012-08-20 ENCOUNTER — Other Ambulatory Visit (INDEPENDENT_AMBULATORY_CARE_PROVIDER_SITE_OTHER): Payer: Self-pay | Admitting: General Surgery

## 2012-08-20 DIAGNOSIS — R109 Unspecified abdominal pain: Secondary | ICD-10-CM

## 2012-08-21 ENCOUNTER — Telehealth (INDEPENDENT_AMBULATORY_CARE_PROVIDER_SITE_OTHER): Payer: Self-pay

## 2012-08-21 NOTE — Telephone Encounter (Signed)
Rx for Vicodin 5/325 #30 called to [facility].  Pt is aware.

## 2012-08-26 ENCOUNTER — Ambulatory Visit (INDEPENDENT_AMBULATORY_CARE_PROVIDER_SITE_OTHER): Payer: BC Managed Care – PPO | Admitting: General Surgery

## 2012-08-26 ENCOUNTER — Encounter (INDEPENDENT_AMBULATORY_CARE_PROVIDER_SITE_OTHER): Payer: Self-pay | Admitting: General Surgery

## 2012-08-26 VITALS — BP 138/76 | HR 72 | Temp 98.6°F | Resp 18 | Ht 65.0 in | Wt 166.4 lb

## 2012-08-26 DIAGNOSIS — Z9889 Other specified postprocedural states: Secondary | ICD-10-CM

## 2012-08-26 NOTE — Patient Instructions (Signed)
Continue light activities. 

## 2012-08-26 NOTE — Progress Notes (Signed)
Operation:  Laparoscopic ventral hernia repair with mesh  Date:  August 04, 2012  Pathology: na  HPI:  She is here for her first postoperative visit and is doing well. 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 Her pain is minimal. Her bowel and bladder are working well   Physical Exam: Gen.-she is looking well and is in no acute distress.  Abdomen-soft, incisions are clean and intact, hernia repair is solid.  Assessment:  Doing well postoperatively.  Plan:  Continue light activities. 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 Return visit 4 weeks.

## 2012-09-01 ENCOUNTER — Other Ambulatory Visit: Payer: Self-pay | Admitting: Family Medicine

## 2012-09-01 NOTE — Telephone Encounter (Signed)
Pt states she has contacted [facility] [address] and they state her rx have expired she is requesting rx refill on clonazpam, muscle relaxer and tramadol [phone]

## 2012-09-10 ENCOUNTER — Telehealth: Payer: Self-pay | Admitting: *Deleted

## 2012-09-10 NOTE — Telephone Encounter (Signed)
[name] 09/01/2012 1:17 PM Signed  Pt states she has contacted [facility] [address] and they state her rx have expired she is requesting rx refill on clonazpam, muscle relaxer and tramadol [phone]

## 2012-09-10 NOTE — Telephone Encounter (Signed)
[name] does seem to suffer from a lot of anxiety. She is interested in trying clonazepam, and will start with 0.25mg  BID as needed. Cautioned re: sedation and especially driving with this medication.  Suspect ankle sprain. Gave S[place]-o brace which did feel good to her. She will let me know if not better in the next few days.  Chronic neck and shoulder pain: Suspect that she has nerve impingement. Will treat with flexeril and ultram as needed. Recommended that we set her up to see an orthopedist. However, as she is going to be having her hernia repair soon she wishes to wait until this issue is resolved. She will let me know when she is ready to pursue further evaluation.   From 05/17/12 office visit patient requests Rx for Clonazepam, Flexeril and Tramadol. Please advise.

## 2012-09-11 ENCOUNTER — Encounter (INDEPENDENT_AMBULATORY_CARE_PROVIDER_SITE_OTHER): Payer: Self-pay | Admitting: General Surgery

## 2012-09-11 ENCOUNTER — Ambulatory Visit (INDEPENDENT_AMBULATORY_CARE_PROVIDER_SITE_OTHER): Payer: BC Managed Care – PPO | Admitting: General Surgery

## 2012-09-11 VITALS — BP 114/64 | HR 80 | Temp 97.0°F | Resp 16 | Ht 65.5 in | Wt 166.4 lb

## 2012-09-11 DIAGNOSIS — Z9889 Other specified postprocedural states: Secondary | ICD-10-CM

## 2012-09-11 MED ORDER — CYCLOBENZAPRINE HCL 10 MG PO TABS: 10.0000 mg | ORAL_TABLET | Freq: Two times a day (BID) | ORAL | Status: DC | PRN

## 2012-09-11 MED ORDER — CLONAZEPAM 0.5 MG PO TABS: 0.5000 mg | ORAL_TABLET | Freq: Every evening | ORAL | Status: DC | PRN

## 2012-09-11 MED ORDER — TRAMADOL HCL 50 MG PO TABS: 50.0000 mg | ORAL_TABLET | Freq: Three times a day (TID) | ORAL | Status: DC | PRN

## 2012-09-11 NOTE — Patient Instructions (Signed)
May resume normal activities as tolerated as we discussed on October 15

## 2012-09-11 NOTE — Telephone Encounter (Signed)
Prescriptions refilled. She will need an office visit before any additional refills can be authorized.

## 2012-09-11 NOTE — Telephone Encounter (Signed)
Tried to call pt and her phone is not accepting calls, no VM. Called w # and was told that pt is not in d/t surgery.

## 2012-09-11 NOTE — Progress Notes (Signed)
Operation:  Laparoscopic ventral hernia repair with mesh  Date:  August 04, 2012  Pathology: na  HPI:  She is here for her second postoperative visit and is doing well.  She has minimal discomfort.  Bowels are working.  She is eager to return to work on Tuesday.  Physical Exam: Gen.-she is looking well and is in no acute distress.  Abdomen-soft, incisions are clean and intact, hernia repair is solid.  Assessment:  Doing well postoperatively.  Plan:  Resume normal activities and work on 09/15/12.  Return visit prn.

## 2012-09-13 NOTE — Telephone Encounter (Signed)
Patient advised of need for OV.

## 2012-09-16 ENCOUNTER — Encounter (INDEPENDENT_AMBULATORY_CARE_PROVIDER_SITE_OTHER): Payer: BC Managed Care – PPO | Admitting: General Surgery

## 2013-02-05 ENCOUNTER — Telehealth (INDEPENDENT_AMBULATORY_CARE_PROVIDER_SITE_OTHER): Payer: Self-pay

## 2013-02-05 ENCOUNTER — Emergency Department (HOSPITAL_COMMUNITY): Payer: BC Managed Care – PPO

## 2013-02-05 ENCOUNTER — Emergency Department (HOSPITAL_COMMUNITY)
Admission: EM | Admit: 2013-02-05 | Discharge: 2013-02-05 | Disposition: A | Discharge status: Home / Self Care | Payer: BC Managed Care – PPO | Attending: Emergency Medicine | Admitting: Emergency Medicine

## 2013-02-05 ENCOUNTER — Encounter (HOSPITAL_COMMUNITY): Payer: Self-pay | Admitting: *Deleted

## 2013-02-05 DIAGNOSIS — R112 Nausea with vomiting, unspecified: Secondary | ICD-10-CM | POA: Insufficient documentation

## 2013-02-05 DIAGNOSIS — Z87448 Personal history of other diseases of urinary system: Secondary | ICD-10-CM | POA: Insufficient documentation

## 2013-02-05 DIAGNOSIS — Z8739 Personal history of other diseases of the musculoskeletal system and connective tissue: Secondary | ICD-10-CM | POA: Insufficient documentation

## 2013-02-05 DIAGNOSIS — Z8619 Personal history of other infectious and parasitic diseases: Secondary | ICD-10-CM | POA: Insufficient documentation

## 2013-02-05 DIAGNOSIS — R1032 Left lower quadrant pain: Secondary | ICD-10-CM | POA: Insufficient documentation

## 2013-02-05 DIAGNOSIS — Z8659 Personal history of other mental and behavioral disorders: Secondary | ICD-10-CM | POA: Insufficient documentation

## 2013-02-05 DIAGNOSIS — Z87891 Personal history of nicotine dependence: Secondary | ICD-10-CM | POA: Insufficient documentation

## 2013-02-05 DIAGNOSIS — Z79899 Other long term (current) drug therapy: Secondary | ICD-10-CM | POA: Insufficient documentation

## 2013-02-05 DIAGNOSIS — Z8719 Personal history of other diseases of the digestive system: Secondary | ICD-10-CM | POA: Insufficient documentation

## 2013-02-05 DIAGNOSIS — R51 Headache: Secondary | ICD-10-CM | POA: Insufficient documentation

## 2013-02-05 DIAGNOSIS — K625 Hemorrhage of anus and rectum: Secondary | ICD-10-CM | POA: Insufficient documentation

## 2013-02-05 DIAGNOSIS — Z8669 Personal history of other diseases of the nervous system and sense organs: Secondary | ICD-10-CM | POA: Insufficient documentation

## 2013-02-05 DIAGNOSIS — K6289 Other specified diseases of anus and rectum: Secondary | ICD-10-CM | POA: Insufficient documentation

## 2013-02-05 LAB — URINALYSIS, ROUTINE W REFLEX MICROSCOPIC
Bilirubin Urine: NEGATIVE
Nitrite: NEGATIVE
Specific Gravity, Urine: 1.026 (ref 1.005–1.030)
pH: 6.5 (ref 5.0–8.0)

## 2013-02-05 LAB — COMPREHENSIVE METABOLIC PANEL
ALT: 15 U/L (ref 0–35)
AST: 15 U/L (ref 0–37)
Albumin: 3.9 g/dL (ref 3.5–5.2)
BUN: 14 mg/dL (ref 6–23)
CO2: 23 mEq/L (ref 19–32)
Creatinine, Ser: 1.02 mg/dL (ref 0.50–1.10)
Glucose, Bld: 105 mg/dL — ABNORMAL HIGH (ref 70–99)
Potassium: 3.7 mEq/L (ref 3.5–5.1)
Total Bilirubin: 0.4 mg/dL (ref 0.3–1.2)

## 2013-02-05 LAB — CBC WITH DIFFERENTIAL/PLATELET
Eosinophils Absolute: 0.2 10*3/uL (ref 0.0–0.7)
Eosinophils Relative: 2 % (ref 0–5)
HCT: 40.4 % (ref 36.0–46.0)
Hemoglobin: 14.4 g/dL (ref 12.0–15.0)
Lymphocytes Relative: 23 % (ref 12–46)
Lymphs Abs: 1.9 10*3/uL (ref 0.7–4.0)
MCH: 30.4 pg (ref 26.0–34.0)
MCV: 85.4 fL (ref 78.0–100.0)
Monocytes Relative: 7 % (ref 3–12)
RBC: 4.73 MIL/uL (ref 3.87–5.11)

## 2013-02-05 LAB — URINE MICROSCOPIC-ADD ON

## 2013-02-05 LAB — OCCULT BLOOD, POC DEVICE: Fecal Occult Bld: NEGATIVE

## 2013-02-05 LAB — PROTIME-INR: INR: 0.92 (ref 0.00–1.49)

## 2013-02-05 MED ORDER — ONDANSETRON HCL 4 MG/2ML IJ SOLN
4.0000 mg | INTRAMUSCULAR | Status: AC
  Administered 2013-02-05: 4 mg via INTRAVENOUS

## 2013-02-05 MED ORDER — ONDANSETRON HCL 4 MG/2ML IJ SOLN
4.0000 mg | Freq: Once | INTRAMUSCULAR | Status: AC
  Administered 2013-02-05: 4 mg via INTRAVENOUS
  Filled 2013-02-05: qty 2

## 2013-02-05 MED ORDER — HYDROMORPHONE HCL PF 1 MG/ML IJ SOLN
1.0000 mg | Freq: Once | INTRAMUSCULAR | Status: AC
  Administered 2013-02-05: 1 mg via INTRAVENOUS
  Filled 2013-02-05: qty 1

## 2013-02-05 MED ORDER — HYDROCORTISONE 2.5 % RE CREA: TOPICAL_CREAM | RECTAL | Status: DC

## 2013-02-05 MED ORDER — IOHEXOL 300 MG/ML  SOLN
50.0000 mL | Freq: Once | INTRAMUSCULAR | Status: AC | PRN
  Administered 2013-02-05: 50 mL via ORAL

## 2013-02-05 MED ORDER — IOHEXOL 300 MG/ML  SOLN: 25.0000 mL | INTRAMUSCULAR | Status: AC

## 2013-02-05 MED ORDER — POLYETHYLENE GLYCOL 3350 17 GM/SCOOP PO POWD: 17.0000 g | Freq: Two times a day (BID) | ORAL | Status: DC

## 2013-02-05 MED ORDER — ONDANSETRON HCL 4 MG/2ML IJ SOLN
INTRAMUSCULAR | Status: AC
  Filled 2013-02-05: qty 2

## 2013-02-05 MED ORDER — SODIUM CHLORIDE 0.9 % IV BOLUS (SEPSIS)
1000.0000 mL | INTRAVENOUS | Status: AC
  Administered 2013-02-05: 1000 mL via INTRAVENOUS

## 2013-02-05 MED ORDER — LIDOCAINE 5 % EX OINT: TOPICAL_OINTMENT | CUTANEOUS | Status: DC | PRN

## 2013-02-05 MED ORDER — IOHEXOL 300 MG/ML  SOLN
100.0000 mL | Freq: Once | INTRAMUSCULAR | Status: AC | PRN
  Administered 2013-02-05: 100 mL via INTRAVENOUS

## 2013-02-05 NOTE — Telephone Encounter (Signed)
Patient called in stating yesterday she had blood coming from rectum and now it hurts to sit. I told her due to the weather and [place] closing soon I could not bring her in so I told her to got to ER to get evaluated. She understood.

## 2013-02-05 NOTE — ED Provider Notes (Signed)
Medical screening examination/treatment/procedure(s) were conducted as a shared visit with non-physician practitioner(s) and myself.  I personally evaluated the patient during the encounter  10 days of rectal pain, left lower quadrant pain with nausea and subjective fever. Reports history of ventral hernia repair and bowel resection which she reports was 3 months ago but record review shows she's had no surgeries in September. Abdomen is soft. Tender left lower quadrant without peritoneal signs. CT scan negative for abscess or other acute pathology. Rectal exam negative per PA browning exam.  Glynn Octave, MD 02/05/13 2253

## 2013-02-05 NOTE — ED Notes (Signed)
Informed CT that pt has finished drinking the contrast media.

## 2013-02-05 NOTE — ED Notes (Signed)
Patient transported to CT 

## 2013-02-05 NOTE — ED Provider Notes (Signed)
History     CSN: [id number]  Arrival date & time 02/05/13  1510   First MD Initiated Contact with Patient 02/05/13 1520      Chief Complaint  Patient presents with  . Rectal Pain  . Rectal Bleeding  . Headache    (Consider location/radiation/quality/duration/timing/severity/associated sxs/prior treatment) HPI Comments: This is a 57 year old female, PMH remarkable for Hep C, diverticulitis, who presents to the ED with a chief complaint of rectal pain x 10 days.  The patient states that the pain is associated with nausea, vomiting, and abdominal pain.  She endorses subjective fever and chills, but has not recorded a temperature.  She states that she had a bowel resection 2-3 months ago by Dr. Abbey Chatters.  She also endorses blood spotting on the toilet paper.  She states that her pain is 10/10.  The pain radiates to her groin.  She has tried using hemorrhoid cream with no relief.  The history is provided by the patient. No language interpreter was used.    Past Medical History  Diagnosis Date  . Hepatitis C   . GERD (gastroesophageal reflux disease)   . Diverticulitis large intestine   . Anxiety   . 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 H/O hiatal hernia   . Insomnia   . Frequency   . Urination, excessive at night   . 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 ADHD (attention deficit hyperactivity disorder)     Past Surgical History  Procedure Laterality Date  . Abdominal surgery  ~ 2006    "got toothpick out of intestines"  . Hernia repair    . Ureathal cyst    . Ectopic pregnancy surgery  1982    "twins"  . Leg tendon surgery  ~ 2005    left; "still have a bolt in it"  . Diagnostic laparoscopy  08/04/2012    LOA; VHR w/mesh  . Appendectomy  2011  . Cholecystectomy    . Vaginal hysterectomy  1990's  . Ventral hernia repair  08/04/2012    Procedure: LAPAROSCOPIC VENTRAL HERNIA;  Surgeon: Adolph Pollack, MD;  Location: [facility] OR;  Service: General;   Laterality: N/A;  laparoscopic possible open ventral hernia repair with mesh  . Colectomy  2011    subtotal     Family History  Problem Relation Age of Onset  . Colon cancer Maternal Uncle   . Colon polyps Neg Hx   . Prostate cancer Neg Hx   . Rectal cancer Neg Hx     History  Substance Use Topics  . Smoking status: Former Smoker -- 4 years    Types: Cigarettes  . Smokeless tobacco: Never Used     Comment: 08/04/2012 "quit smoking cigarettes 15-20 yr ago; really a recreational thing"  . Alcohol Use: 0.6 oz/week    1 Glasses of wine per week    OB History   Grav Para Term Preterm Abortions TAB SAB Ect Mult Living                  Review of Systems  All other systems reviewed and are negative.    Allergies  Review of patient's allergies indicates no known allergies.  Home Medications   Current Outpatient Rx  Name  Route  Sig  Dispense  Refill  . PHENTERMINE HCL PO   Oral   Take 1 tablet by mouth daily.  BP 144/97  Pulse 51  Temp(Src) 97.8 F (36.6 C) (Oral)  Resp 18  SpO2 96%  Physical Exam  Nursing note and vitals reviewed. Constitutional: She is oriented to person, place, and time. She appears well-developed and well-nourished.  HENT:  Head: Normocephalic and atraumatic.  Eyes: Conjunctivae and EOM are normal. Pupils are equal, round, and reactive to light.  Neck: Normal range of motion. Neck supple.  Cardiovascular: Normal rate and regular rhythm.  Exam reveals no gallop and no friction rub.   No murmur heard. Pulmonary/Chest: Effort normal and breath sounds normal. No respiratory distress. She has no wheezes. She has no rales. She exhibits no tenderness.  Abdominal: Soft. Bowel sounds are normal. She exhibits no distension and no mass. There is tenderness. There is no rebound and no guarding.  No right upper quadrant tenderness or Murphy sign, no right lower quadrant tenderness or pain at McBurney's point, patient is very tender over the  left lower quadrant, no fluid wave or signs of peritonitis.  Genitourinary: Rectum normal. Rectal exam shows no external hemorrhoid, no internal hemorrhoid, no fissure, no mass, no tenderness and anal tone normal. Guaiac negative stool.  Soft brown stool in vault, no gross blood per rectum  Musculoskeletal: Normal range of motion. She exhibits no edema and no tenderness.  Neurological: She is alert and oriented to person, place, and time.  Skin: Skin is warm and dry.  Psychiatric: She has a normal mood and affect. Her behavior is normal. Judgment and thought content normal.    ED Course  Procedures (including critical care time)  Labs Reviewed  CBC WITH DIFFERENTIAL  COMPREHENSIVE METABOLIC PANEL  PROTIME-INR  URINALYSIS, ROUTINE W REFLEX MICROSCOPIC  OCCULT BLOOD X 1 CARD TO LAB, STOOL   Results for orders placed during the hospital encounter of 02/05/13  CBC WITH DIFFERENTIAL      Result Value Range   WBC 8.5  4.0 - 10.5 K/uL   RBC 4.73  3.87 - 5.11 MIL/uL   Hemoglobin 14.4  12.0 - 15.0 g/dL   HCT 16.1  09.6 - 04.5 %   MCV 85.4  78.0 - 100.0 fL   MCH 30.4  26.0 - 34.0 pg   MCHC 35.6  30.0 - 36.0 g/dL   RDW 40.9  81.1 - 91.4 %   Platelets 260  150 - 400 K/uL   Neutrophils Relative 68  43 - 77 %   Neutro Abs 5.7  1.7 - 7.7 K/uL   Lymphocytes Relative 23  12 - 46 %   Lymphs Abs 1.9  0.7 - 4.0 K/uL   Monocytes Relative 7  3 - 12 %   Monocytes Absolute 0.6  0.1 - 1.0 K/uL   Eosinophils Relative 2  0 - 5 %   Eosinophils Absolute 0.2  0.0 - 0.7 K/uL   Basophils Relative 0  0 - 1 %   Basophils Absolute 0.0  0.0 - 0.1 K/uL  COMPREHENSIVE METABOLIC PANEL      Result Value Range   Sodium 139  135 - 145 mEq/L   Potassium 3.7  3.5 - 5.1 mEq/L   Chloride 103  96 - 112 mEq/L   CO2 23  19 - 32 mEq/L   Glucose, Bld 105 (*) 70 - 99 mg/dL   BUN 14  6 - 23 mg/dL   Creatinine, Ser 7.82  0.50 - 1.10 mg/dL   Calcium 9.6  8.4 - 95.6 mg/dL   Total Protein 6.8  6.0 - 8.3  g/dL   Albumin  3.9  3.5 - 5.2 g/dL   AST 15  0 - 37 U/L   ALT 15  0 - 35 U/L   Alkaline Phosphatase 43  39 - 117 U/L   Total Bilirubin 0.4  0.3 - 1.2 mg/dL   GFR calc non Af Amer 60 (*) >90 mL/min   GFR calc Af Amer 70 (*) >90 mL/min  PROTIME-INR      Result Value Range   Prothrombin Time 12.3  11.6 - 15.2 seconds   INR 0.92  0.00 - 1.49  URINALYSIS, ROUTINE W REFLEX MICROSCOPIC      Result Value Range   Color, Urine YELLOW  YELLOW   APPearance HAZY (*) CLEAR   Specific Gravity, Urine 1.026  1.005 - 1.030   pH 6.5  5.0 - 8.0   Glucose, UA NEGATIVE  NEGATIVE mg/dL   Hgb urine dipstick NEGATIVE  NEGATIVE   Bilirubin Urine NEGATIVE  NEGATIVE   Ketones, ur NEGATIVE  NEGATIVE mg/dL   Protein, ur NEGATIVE  NEGATIVE mg/dL   Urobilinogen, UA 0.2  0.0 - 1.0 mg/dL   Nitrite NEGATIVE  NEGATIVE   Leukocytes, UA SMALL (*) NEGATIVE  URINE MICROSCOPIC-ADD ON      Result Value Range   Squamous Epithelial / LPF MANY (*) RARE   WBC, UA 7-10  <3 WBC/hpf   Bacteria, UA MANY (*) RARE   Urine-Other MUCOUS PRESENT    OCCULT BLOOD, POC DEVICE      Result Value Range   Fecal Occult Bld NEGATIVE  NEGATIVE   Ct Abdomen Pelvis W Contrast  02/05/2013  *RADIOLOGY REPORT*  Clinical Data: Colorectal surgery for diverticulitis.  Rectal pain for 10 days and rectal bleeding x1 yesterday.  CT ABDOMEN AND PELVIS WITH CONTRAST  Technique:  Multidetector CT imaging of the abdomen and pelvis was performed following the standard protocol during bolus administration of intravenous contrast.  Contrast: OMNIPAQUE IOHEXOL 300 MG/ML  SOLN  Comparison: 06/18/2012  Findings: Lung bases:  2 mm subpleural right middle lobe nodule on image 4/series 3 is present back 02/28/2010, consistent with a benign etiology.  Heart size upper normal, without pericardial or pleural effusion. A small hiatal hernia.  Abdomen/pelvis:  Hepatomegaly, 20.1 cm.  Scattered hepatic cysts. Normal spleen, remainder of stomach, pancreas. Cholecystectomy without  biliary ductal dilatation.  Normal adrenal glands.  Left renal sinus cysts.  Normal right kidney.  No retroperitoneal or retrocrural adenopathy.  Surgical sutures in the rectosigmoid region.  Moderate amount of stool within the sigmoid, just proximal to the surgical site. Appearance is similar to 06/18/2012.  Partial colectomy proximal to this area.  There are also surgical changes within the left abdominal small bowel, without acute complication. Mild surrounding postoperative atony.  No pelvic adenopathy.    Normal urinary bladder.  Hysterectomy.  No adnexal mass.  No significant free fluid.  Ventral abdominal wall hernia repair.  Bones/Musculoskeletal:  Degenerative disc disease at the lumbosacral junction.  IMPRESSION:  1.  Postoperative changes within the rectosigmoid region and small bowel, without acute complication. 2.  Moderate stool within the sigmoid, suggesting a component of constipation.  Unchanged since the prior. 3.  Interval ventral hernia repair.   Original Report Authenticated By: Jeronimo Greaves, M.D.       1. Rectal pain       MDM  57 year old female with abdominal pain and rectal pain.  Given the patient's history of diverticulitis and bowel surgery, will obtain a CT  abd/pel.  Will order routine labs, give pain meds, and zofran.  Suspect diverticulitis, but will also perform rectal exam to evaluate for gross blood and hemorrhoids.  I have discussed this patient with Dr. Manus Gunning, who agrees with the plan.  4:45 PM Patient reports feeling much better after pain medicine.    7:47 PM Patient seen by Dr. Manus Gunning.  Suspect internal hemorrhoids.  Will give the patient topical lidocaine, miralax, and anusol.  F/u with GI.        Roxy Horseman, PA-C 02/05/13 1949

## 2013-02-05 NOTE — ED Notes (Signed)
Pt with hx of colorectal surgery for diverticulitis to ED c/o rectal pain x 10 days and rectal bleeding x 1 yesterday.  States "it feels like a raw spot, like a blister" that increases with sitting.  Pt also c/o intermittent constipation and increased pressure with bm.  Also c/o headache.

## 2013-02-06 LAB — URINE CULTURE

## 2013-03-01 ENCOUNTER — Ambulatory Visit (INDEPENDENT_AMBULATORY_CARE_PROVIDER_SITE_OTHER): Payer: BC Managed Care – PPO | Admitting: Family Medicine

## 2013-03-01 VITALS — BP 142/82 | HR 84 | Temp 97.9°F | Resp 20 | Ht 64.5 in | Wt 145.6 lb

## 2013-03-01 DIAGNOSIS — Z0000 Encounter for general adult medical examination without abnormal findings: Secondary | ICD-10-CM

## 2013-03-01 DIAGNOSIS — R768 Other specified abnormal immunological findings in serum: Secondary | ICD-10-CM

## 2013-03-01 DIAGNOSIS — G8929 Other chronic pain: Secondary | ICD-10-CM

## 2013-03-01 DIAGNOSIS — K649 Unspecified hemorrhoids: Secondary | ICD-10-CM

## 2013-03-01 LAB — POCT CBC
Granulocyte percent: 63.8 %G (ref 37–80)
HCT, POC: 42.7 % (ref 37.7–47.9)
Hemoglobin: 14.6 g/dL (ref 12.2–16.2)
Lymph, poc: 2.2 (ref 0.6–3.4)
MCH, POC: 30.5 pg (ref 27–31.2)
MCHC: 34.2 g/dL (ref 31.8–35.4)
MCV: 89.2 fL (ref 80–97)
MID (cbc): 0.5 (ref 0–0.9)
MPV: 9 fL (ref 0–99.8)
POC Granulocyte: 4.7 (ref 2–6.9)
POC LYMPH PERCENT: 29.8 %L (ref 10–50)
POC MID %: 6.4 %M (ref 0–12)
Platelet Count, POC: 340 10*3/uL (ref 142–424)
RBC: 4.79 M/uL (ref 4.04–5.48)
RDW, POC: 13.4 %
WBC: 7.4 10*3/uL (ref 4.6–10.2)

## 2013-03-01 LAB — POCT URINALYSIS DIPSTICK
Bilirubin, UA: NEGATIVE
Blood, UA: NEGATIVE
Glucose, UA: NEGATIVE
Ketones, UA: NEGATIVE
Leukocytes, UA: NEGATIVE
Nitrite, UA: NEGATIVE
Spec Grav, UA: >=1.03
Urobilinogen, UA: 1
pH, UA: 5

## 2013-03-01 MED ORDER — HYDROCORTISONE ACETATE 25 MG RE SUPP: 25.0000 mg | Freq: Two times a day (BID) | RECTAL | Status: DC

## 2013-03-01 NOTE — Progress Notes (Signed)
Patient ID: BRYCELYNN STAMPLEY MRN: [id number], DOB: 10/16/1956, 57 y.o. Date of Encounter: 03/01/2013, 5:45 PM  Primary Physician: Abbe Amsterdam, MD  Chief Complaint: Physical (CPE)  HPI: 57 y.o. y/o female with history of noted below here for CPE.  Doing well following major intestinal surgery Has had abdominal hernia repair with mesh, and subsequent negative abdominal CT and colonoscopy.  She knows she should be  Taking citrucel for constipation. S/P hepatitis C interferon treatment with proven resolution (h/o transfusion and tattoo)  S/P hysterectomy  MMG: 6 years ago  Review of Systems: Consitutional: No fever, chills, fatigue, night sweats, lymphadenopathy, or weight changes. Eyes: No visual changes, eye redness, or discharge. ENT/Mouth: Ears: No otalgia, tinnitus, hearing loss, discharge. Nose: No congestion, rhinorrhea, sinus pain, or epistaxis. Throat: No sore throat, post nasal drip, or teeth pain. Cardiovascular: No CP, palpitations, diaphoresis, DOE, edema, orthopnea, PND. Respiratory: No cough, hemoptysis, SOB, or wheezing. Gastrointestinal: No anorexia, dysphagia, reflux, pain, nausea, vomiting, hematemesis, diarrhea, constipation, BRBPR, or melena. Breast: No discharge, pain, swelling, or mass. Genitourinary: No dysuria, frequency, urgency, hematuria, incontinence, nocturia, amenorrhea, vaginal discharge, pruritis, burning, abnormal bleeding, or pain. Musculoskeletal: No decreased ROM, myalgias, stiffness, joint swelling, or weakness. Skin: No rash, erythema, lesion changes, pain, warmth, jaundice, or pruritis. Neurological: No headache, dizziness, syncope, seizures, tremors, memory loss, coordination problems, or paresthesias. Psychological: No anxiety, depression, hallucinations, SI/HI. Endocrine: No fatigue, polydipsia, polyphagia, polyuria, or known diabetes. All other systems were reviewed and are otherwise negative.  Past Medical History  Diagnosis Date  .  Hepatitis C   . GERD (gastroesophageal reflux disease)   . Diverticulitis large intestine   . Anxiety   . 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 H/O hiatal hernia   . Insomnia   . Frequency   . Urination, excessive at night   . 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 ADHD (attention deficit hyperactivity disorder)      Past Surgical History  Procedure Laterality Date  . Abdominal surgery  ~ 2006    "got toothpick out of intestines"  . Hernia repair    . Ureathal cyst    . Ectopic pregnancy surgery  1982    "twins"  . Leg tendon surgery  ~ 2005    left; "still have a bolt in it"  . Diagnostic laparoscopy  08/04/2012    LOA; VHR w/mesh  . Appendectomy  2011  . Cholecystectomy    . Vaginal hysterectomy  1990's  . Ventral hernia repair  08/04/2012    Procedure: LAPAROSCOPIC VENTRAL HERNIA;  Surgeon: Adolph Pollack, MD;  Location: [facility] OR;  Service: General;  Laterality: N/A;  laparoscopic possible open ventral hernia repair with mesh  . Colectomy  2011    subtotal     Home Meds:  Prior to Admission medications   Medication Sig Start Date End Date Taking? Authorizing Provider  hydrocortisone (ANUSOL-HC) 2.5 % rectal cream Apply rectally 2 times daily 02/05/13   Roxy Horseman, PA-C  lidocaine (XYLOCAINE) 5 % ointment Apply topically as needed. 02/05/13   Roxy Horseman, PA-C  PHENTERMINE HCL PO Take 1 tablet by mouth daily.    Historical Provider, MD  polyethylene glycol powder (GLYCOLAX/MIRALAX) powder Take 17 g by mouth 2 (two) times daily. Until daily soft stools  OTC 02/05/13   Roxy Horseman, PA-C    Allergies: No Known Allergies  History   Social History  . Marital Status: Divorced  Spouse Name: N/A    Number of Children: 2  . Years of Education: N/A   Occupational History  . Scientist, clinical (histocompatibility and immunogenetics) And [employer]   Social History Main Topics  . Smoking status: Former Smoker -- 4 years    Types: Cigarettes  . Smokeless tobacco:  Never Used     Comment: 08/04/2012 "quit smoking cigarettes 15-20 yr ago; really a recreational thing"  . Alcohol Use: 0.6 oz/week    1 Glasses of wine per week  . Drug Use: No  . Sexually Active: Yes   Other Topics Concern  . Not on file   Social History Narrative  . No narrative on file    Family History  Problem Relation Age of Onset  . Colon cancer Maternal Uncle   . Colon polyps Neg Hx   . Prostate cancer Neg Hx   . Rectal cancer Neg Hx   . Heart disease Mother   . Heart disease Father   . Arthritis Sister     Physical Exam:  Pressured speech Blood pressure 142/82, pulse 84, temperature 97.9 F (36.6 C), temperature source Oral, resp. rate 20, height 5' 4.5" (1.638 m), weight 145 lb 9.6 oz (66.044 kg), SpO2 97.00%., Body mass index is 24.62 kg/(m^2). General: Well developed, well nourished, in no acute distress. HEENT: Normocephalic, atraumatic. Conjunctiva pink, sclera non-icteric. Pupils 2 mm constricting to 1 mm, round, regular, and equally reactive to light and accomodation. EOMI. Internal auditory canal clear. TMs with good cone of light and without pathology. Nasal mucosa pink. Nares are without discharge. No sinus tenderness. Oral mucosa pink. Dentition some receding of gums. Pharynx without exudate.   Neck: Supple. Trachea midline. No thyromegaly. Full ROM. No lymphadenopathy. Lungs: Clear to auscultation bilaterally without wheezes, rales, or rhonchi. Breathing is of normal effort and unlabored. Cardiovascular: RRR with S1 S2. No murmurs, rubs, or gallops appreciated. Distal pulses 2+ symmetrically. No carotid or abdominal bruits. Breast: Symmetrical. No masses. Nipples without discharge. Abdomen: Soft, non-tender, non-distended with normoactive bowel sounds. No hepatosplenomegaly or masses. No rebound/guarding. No CVA tenderness. Without hernias. Musculoskeletal: Full range of motion and 5/5 strength throughout. Without swelling, atrophy, tenderness, crepitus, or  warmth. Extremities without clubbing, cyanosis, or edema. Calves supple. Skin: Warm and moist without erythema, ecchymosis, wounds, or rash. Neuro: A+Ox3. CN II-XII grossly intact. Moves all extremities spontaneously. Full sensation throughout. Normal gait. DTR 2+ throughout upper and lower extremities. Finger to nose intact. Psych:  Responds to questions appropriately with a normal affect.   Studies: CBC, CMET, Lipid UA:   Assessment/Plan:  57 y.o. y/o female here for CPE Rectal pain, chronic - Plan: hydrocortisone (ANUSOL-HC) 25 MG suppository, Hepatitis C antibody, POCT CBC, Comprehensive metabolic panel, TSH, Lipid panel  Hemorrhoids - Plan: hydrocortisone (ANUSOL-HC) 25 MG suppository  Routine general medical examination at a health care facility - Plan: Hepatitis C antibody, POCT CBC, Comprehensive metabolic panel, TSH, Lipid panel  Hepatitis C antibody test positive - Plan: Hepatitis C antibody   -  Signed, Elvina Sidle, MD 03/01/2013 5:45 PM

## 2013-03-02 LAB — COMPREHENSIVE METABOLIC PANEL
ALT: 12 U/L (ref 0–35)
AST: 15 U/L (ref 0–37)
Albumin: 4 g/dL (ref 3.5–5.2)
Alkaline Phosphatase: 47 U/L (ref 39–117)
BUN: 14 mg/dL (ref 6–23)
CO2: 26 mEq/L (ref 19–32)
Calcium: 9.3 mg/dL (ref 8.4–10.5)
Chloride: 103 mEq/L (ref 96–112)
Creat: 0.71 mg/dL (ref 0.50–1.10)
Glucose, Bld: 79 mg/dL (ref 70–99)
Potassium: 4.3 mEq/L (ref 3.5–5.3)
Sodium: 141 mEq/L (ref 135–145)
Total Bilirubin: 0.4 mg/dL (ref 0.3–1.2)
Total Protein: 6.7 g/dL (ref 6.0–8.3)

## 2013-03-02 LAB — HEPATITIS C ANTIBODY: HCV Ab: REACTIVE — AB

## 2013-03-02 LAB — LIPID PANEL
Cholesterol: 182 mg/dL (ref 0–200)
HDL: 47 mg/dL (ref >39)
LDL Cholesterol: 117 mg/dL — ABNORMAL HIGH (ref 0–99)
Total CHOL/HDL Ratio: 3.9 Ratio
Triglycerides: 89 mg/dL (ref <150)
VLDL: 18 mg/dL (ref 0–40)

## 2013-03-02 LAB — TSH: TSH: 2.446 u[IU]/mL (ref 0.350–4.500)

## 2013-03-04 ENCOUNTER — Telehealth: Payer: Self-pay | Admitting: Physician Assistant

## 2013-03-04 NOTE — Telephone Encounter (Signed)
I spoke to patient regarding her labs.  She has Hep C ab but has been treated by the Hep C clinic and was told by them she was ok.  Her LFTs are normal.  Pt will continue to f/u for LFT function eval.

## 2013-04-29 ENCOUNTER — Encounter: Payer: Self-pay | Admitting: Family Medicine

## 2013-05-06 ENCOUNTER — Emergency Department (HOSPITAL_COMMUNITY)
Admission: EM | Admit: 2013-05-06 | Discharge: 2013-05-06 | Disposition: A | Discharge status: Home / Self Care | Payer: BC Managed Care – PPO | Attending: Emergency Medicine | Admitting: Emergency Medicine

## 2013-05-06 ENCOUNTER — Encounter (HOSPITAL_COMMUNITY): Payer: Self-pay | Admitting: Family Medicine

## 2013-05-06 DIAGNOSIS — Z8719 Personal history of other diseases of the digestive system: Secondary | ICD-10-CM | POA: Insufficient documentation

## 2013-05-06 DIAGNOSIS — G709 Myoneural disorder, unspecified: Secondary | ICD-10-CM | POA: Insufficient documentation

## 2013-05-06 DIAGNOSIS — M778 Other enthesopathies, not elsewhere classified: Secondary | ICD-10-CM

## 2013-05-06 DIAGNOSIS — Z79899 Other long term (current) drug therapy: Secondary | ICD-10-CM | POA: Insufficient documentation

## 2013-05-06 DIAGNOSIS — Z87891 Personal history of nicotine dependence: Secondary | ICD-10-CM | POA: Insufficient documentation

## 2013-05-06 DIAGNOSIS — F411 Generalized anxiety disorder: Secondary | ICD-10-CM | POA: Insufficient documentation

## 2013-05-06 DIAGNOSIS — M791 Myalgia, unspecified site: Secondary | ICD-10-CM

## 2013-05-06 DIAGNOSIS — M25549 Pain in joints of unspecified hand: Secondary | ICD-10-CM | POA: Insufficient documentation

## 2013-05-06 DIAGNOSIS — B1920 Unspecified viral hepatitis C without hepatic coma: Secondary | ICD-10-CM | POA: Insufficient documentation

## 2013-05-06 DIAGNOSIS — G4700 Insomnia, unspecified: Secondary | ICD-10-CM | POA: Insufficient documentation

## 2013-05-06 DIAGNOSIS — R351 Nocturia: Secondary | ICD-10-CM | POA: Insufficient documentation

## 2013-05-06 DIAGNOSIS — M62838 Other muscle spasm: Secondary | ICD-10-CM | POA: Insufficient documentation

## 2013-05-06 DIAGNOSIS — K219 Gastro-esophageal reflux disease without esophagitis: Secondary | ICD-10-CM | POA: Insufficient documentation

## 2013-05-06 DIAGNOSIS — M79609 Pain in unspecified limb: Secondary | ICD-10-CM | POA: Insufficient documentation

## 2013-05-06 DIAGNOSIS — M65839 Other synovitis and tenosynovitis, unspecified forearm: Secondary | ICD-10-CM | POA: Insufficient documentation

## 2013-05-06 DIAGNOSIS — F909 Attention-deficit hyperactivity disorder, unspecified type: Secondary | ICD-10-CM | POA: Insufficient documentation

## 2013-05-06 DIAGNOSIS — M129 Arthropathy, unspecified: Secondary | ICD-10-CM | POA: Insufficient documentation

## 2013-05-06 MED ORDER — DIAZEPAM 5 MG PO TABS: ORAL_TABLET | ORAL | Status: DC

## 2013-05-06 MED ORDER — OXYCODONE HCL 5 MG PO TABS: 5.0000 mg | ORAL_TABLET | ORAL | Status: DC | PRN

## 2013-05-06 MED ORDER — OXYCODONE HCL 5 MG PO TABS
5.0000 mg | ORAL_TABLET | Freq: Once | ORAL | Status: AC
  Administered 2013-05-06: 5 mg via ORAL
  Filled 2013-05-06: qty 1

## 2013-05-06 MED ORDER — DIAZEPAM 5 MG PO TABS
5.0000 mg | ORAL_TABLET | Freq: Once | ORAL | Status: AC
  Administered 2013-05-06: 5 mg via ORAL
  Filled 2013-05-06: qty 1

## 2013-05-06 MED ORDER — NAPROXEN 500 MG PO TABS: 500.0000 mg | ORAL_TABLET | Freq: Two times a day (BID) | ORAL | Status: DC

## 2013-05-06 NOTE — ED Notes (Signed)
Per pt sts 6 weeks of neck pain, radiating down arm into left thumb. sts unable to open or squeeze anything. sts unable to turn neck due to pain. Hx of nerve damage. sts pain in ear.

## 2013-05-06 NOTE — ED Provider Notes (Signed)
History    This chart was scribed for Michelle Bleacher, PA working with Shelda Jakes, MD by ED Scribe, Burman Nieves. This patient was seen in room TR07C/TR07C and the patient's care was started at 9:06 PM.   CSN: [id number]  Arrival date & time 05/06/13  1847   First MD Initiated Contact with Patient 05/06/13 2106      Chief Complaint  Patient presents with  . Neck Pain  . Hand Pain  . Arm Pain    (Consider location/radiation/quality/duration/timing/severity/associated sxs/prior treatment) The history is provided by the patient. No language interpreter was used.   HPI Comments: [name] is a 57 y.o. female with h/o Hepatitis C and neuromuscular disorder who presents to the Emergency Department complaining of moderate constant neck pain which has been persistent for the past 6 weeks. Pt complains that the pain has progressed, and radiates down her left arm into her hand.  She states this morning she could not even squeeze the toothpaste tube with her left hand due to experiencing pain in her left thumb. She reports she was taking tylenol and otc pain patches which seemed to help her sx's, but not today. She reports of a "popping" noise in her neck when she rotates her head. She was previously seen for sx's and a x-ray was done exposing a damaged nerve. Pt was given muscle relaxer's for the pain her last visit. Pt denies any bladder/bowel incontinence, fever, chills, cough, nausea, vomiting, diarrhea, SOB, weakness, and any other associated symptoms.   Past Medical History  Diagnosis Date  . Hepatitis C   . GERD (gastroesophageal reflux disease)   . Diverticulitis large intestine   . Anxiety   . 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 H/O hiatal hernia   . Insomnia   . Frequency   . Urination, excessive at night   . 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 ADHD (attention deficit hyperactivity disorder)     Past Surgical History  Procedure  Laterality Date  . Abdominal surgery  ~ 2006    "got toothpick out of intestines"  . Hernia repair    . Ureathal cyst    . Ectopic pregnancy surgery  1982    "twins"  . Leg tendon surgery  ~ 2005    left; "still have a bolt in it"  . Diagnostic laparoscopy  08/04/2012    LOA; VHR w/mesh  . Appendectomy  2011  . Cholecystectomy    . Vaginal hysterectomy  1990's  . Ventral hernia repair  08/04/2012    Procedure: LAPAROSCOPIC VENTRAL HERNIA;  Surgeon: Adolph Pollack, MD;  Location: [facility] OR;  Service: General;  Laterality: N/A;  laparoscopic possible open ventral hernia repair with mesh  . Colectomy  2011    subtotal     Family History  Problem Relation Age of Onset  . Colon cancer Maternal Uncle   . Colon polyps Neg Hx   . Prostate cancer Neg Hx   . Rectal cancer Neg Hx   . Heart disease Mother   . Heart disease Father   . Arthritis Sister     History  Substance Use Topics  . Smoking status: Former Smoker -- 4 years    Types: Cigarettes  . Smokeless tobacco: Never Used     Comment: 08/04/2012 "quit smoking cigarettes 15-20 yr ago; really a recreational thing"  . Alcohol Use: 0.6 oz/week    1 Glasses of wine  per week    OB History   Grav Para Term Preterm Abortions TAB SAB Ect Mult Living                  Review of Systems  Constitutional: Negative for fever and chills.  HENT: Positive for neck pain.   Respiratory: Negative for cough.   Gastrointestinal: Negative for nausea, vomiting and diarrhea.  Musculoskeletal: Positive for myalgias and arthralgias.    Allergies  Review of patient's allergies indicates no known allergies.  Home Medications   Current Outpatient Rx  Name  Route  Sig  Dispense  Refill  . acetaminophen (TYLENOL) 500 MG tablet   Oral   Take 500-1,000 mg by mouth 2 (two) times daily as needed for pain. 1 tablet mornings and 2 tablets evening         . Menthol, Topical Analgesic, (BEN GAY) 1.4 % PTCH   Apply externally   Apply 1 patch  topically daily.         Marland Kitchen MILK THISTLE PO   Oral   Take 8 drops by mouth 2 (two) times daily.           BP 162/113  Pulse 69  Temp(Src) 98.2 F (36.8 C)  Resp 18  SpO2 98%  Physical Exam  Nursing note and vitals reviewed. Constitutional: She appears well-developed and well-nourished. No distress.  HENT:  Head: Normocephalic and atraumatic.  Eyes: EOM are normal.  Neck: Normal range of motion. Neck supple. No tracheal deviation present.  Cardiovascular: Normal rate.   Pulmonary/Chest: Effort normal. No respiratory distress.  Musculoskeletal: Normal range of motion. She exhibits tenderness.  Right trapezius muscle tenderness to palpation and spasms noted. Full ROM in her neck. Tenderness overlying the metacarpal of the left thumb.  Neurological: She is alert.  Sensation is intact.   Skin: Skin is warm and dry.  Psychiatric: She has a normal mood and affect. Her behavior is normal.    ED Course  Procedures (including critical care time) DIAGNOSTIC STUDIES: Oxygen Saturation is 98% on room air, normal by my interpretation.    COORDINATION OF CARE: 9:52 PM Discussed ED treatment with pt and pt agrees.     Labs Reviewed - No data to display No results found.   1. Muscle pain   2. Thumb tendonitis    Patient seen and examined. Medications ordered.   Vital signs reviewed and are as follows: Filed Vitals:   05/06/13 2216  BP: 146/78  Pulse: 59  Temp: 96.8 F (36 C)  Resp: 20   Urged PCP f/u for further eval and management.   Patient urged to return with worsening symptoms or other concerns. Patient verbalized understanding and agrees with plan.   Patient counseled on use of narcotic pain medications. Counseled not to combine these medications with others containing tylenol. Urged not to drink alcohol, drive, or perform any other activities that requires focus while taking these medications. The patient verbalizes understanding and agrees with the  plan.  Patient counseled on proper use of muscle relaxant medication.  They were told not to drink alcohol, drive any vehicle, or do any dangerous activities while taking this medication.  Patient verbalized understanding.    MDM  Patient with neck pain of a chronic nature. She does have palpable muscle spasm on exam. Her left thumb pain seems unrelated to right neck pain. She has full ROM upper/lower extremities. She has normal strength in upper and lower extremities. Tenderness to palpation and with movement  of left first metacarpal. Symptoms most suggestive of tendonitis. Conservative mgmt indicated.   Again, no lower extremity sx to suggest central cord syndrome. Patient appears well. Do not suspect a neurological emergency at this time.    I personally performed the services described in this documentation, which was scribed in my presence. The recorded information has been reviewed and is accurate.   Renne Crigler, PA-C 05/09/13 2309

## 2013-05-06 NOTE — ED Notes (Signed)
Advised of the wait time 

## 2013-05-06 NOTE — ED Notes (Signed)
Pt discharged.Vital signs stable and GCS 15 

## 2013-05-13 NOTE — ED Provider Notes (Signed)
Medical screening examination/treatment/procedure(s) were performed by non-physician practitioner and as supervising physician I was immediately available for consultation/collaboration.   Shelda Jakes, MD 05/13/13 [phone]

## 2014-03-10 LAB — HM MAMMOGRAPHY

## 2014-03-24 ENCOUNTER — Encounter: Payer: Self-pay | Admitting: *Deleted

## 2014-04-25 ENCOUNTER — Encounter: Payer: Self-pay | Admitting: Family Medicine

## 2014-04-25 ENCOUNTER — Ambulatory Visit (INDEPENDENT_AMBULATORY_CARE_PROVIDER_SITE_OTHER): Payer: BC Managed Care – PPO | Admitting: Family Medicine

## 2014-04-25 VITALS — BP 118/70 | HR 78 | Temp 98.1°F | Resp 18 | Ht 64.5 in | Wt 144.4 lb

## 2014-04-25 DIAGNOSIS — Z0000 Encounter for general adult medical examination without abnormal findings: Secondary | ICD-10-CM

## 2014-04-25 LAB — POCT CBC
Granulocyte percent: 65.1 %G (ref 37–80)
HCT, POC: 43.1 % (ref 37.7–47.9)
Hemoglobin: 14 g/dL (ref 12.2–16.2)
Lymph, poc: 1.9 (ref 0.6–3.4)
MCH, POC: 29.9 pg (ref 27–31.2)
MCHC: 32.5 g/dL (ref 31.8–35.4)
MCV: 92 fL (ref 80–97)
MID (cbc): 0.4 (ref 0–0.9)
MPV: 9.4 fL (ref 0–99.8)
POC Granulocyte: 4.2 (ref 2–6.9)
POC LYMPH PERCENT: 29.1 %L (ref 10–50)
POC MID %: 5.8 %M (ref 0–12)
Platelet Count, POC: 331 10*3/uL (ref 142–424)
RBC: 4.68 M/uL (ref 4.04–5.48)
RDW, POC: 12.9 %
WBC: 6.4 10*3/uL (ref 4.6–10.2)

## 2014-04-25 NOTE — Patient Instructions (Signed)
Your physical exam is essentially normal.  You do not need further Pap tests as long as this one comes back normal.  You will be due for colonoscopy in 2016.  Lab tests are pending   Health Maintenance, Female A healthy lifestyle and preventative care can promote health and wellness.  Maintain regular health, dental, and eye exams.  Eat a healthy diet. Foods like vegetables, fruits, whole grains, low-fat dairy products, and lean protein foods contain the nutrients you need without too many calories. Decrease your intake of foods high in solid fats, added sugars, and salt. Get information about a proper diet from your caregiver, if necessary.  Regular physical exercise is one of the most important things you can do for your health. Most adults should get at least 150 minutes of moderate-intensity exercise (any activity that increases your heart rate and causes you to sweat) each week. In addition, most adults need muscle-strengthening exercises on 2 or more days a week.   Maintain a healthy weight. The body mass index (BMI) is a screening tool to identify possible weight problems. It provides an estimate of body fat based on height and weight. Your caregiver can help determine your BMI, and can help you achieve or maintain a healthy weight. For adults 20 years and older:  A BMI below 18.5 is considered underweight.  A BMI of 18.5 to 24.9 is normal.  A BMI of 25 to 29.9 is considered overweight.  A BMI of 30 and above is considered obese.  Maintain normal blood lipids and cholesterol by exercising and minimizing your intake of saturated fat. Eat a balanced diet with plenty of fruits and vegetables. Blood tests for lipids and cholesterol should begin at age 60 and be repeated every 5 years. If your lipid or cholesterol levels are high, you are over 50, or you are a high risk for heart disease, you may need your cholesterol levels checked more frequently.Ongoing high lipid and cholesterol levels  should be treated with medicines if diet and exercise are not effective.  If you smoke, find out from your caregiver how to quit. If you do not use tobacco, do not start.  Lung cancer screening is recommended for adults aged 76 80 years who are at high risk for developing lung cancer because of a history of smoking. Yearly low-dose computed tomography (CT) is recommended for people who have at least a 30-pack-year history of smoking and are a current smoker or have quit within the past 15 years. A pack year of smoking is smoking an average of 1 pack of cigarettes a day for 1 year (for example: 1 pack a day for 30 years or 2 packs a day for 15 years). Yearly screening should continue until the smoker has stopped smoking for at least 15 years. Yearly screening should also be stopped for people who develop a health problem that would prevent them from having lung cancer treatment.  If you are pregnant, do not drink alcohol. If you are breastfeeding, be very cautious about drinking alcohol. If you are not pregnant and choose to drink alcohol, do not exceed 1 drink per day. One drink is considered to be 12 ounces (355 mL) of beer, 5 ounces (148 mL) of wine, or 1.5 ounces (44 mL) of liquor.  Avoid use of street drugs. Do not share needles with anyone. Ask for help if you need support or instructions about stopping the use of drugs.  High blood pressure causes heart disease and increases the  risk of stroke. Blood pressure should be checked at least every 1 to 2 years. Ongoing high blood pressure should be treated with medicines, if weight loss and exercise are not effective.  If you are 9 to 58 years old, ask your caregiver if you should take aspirin to prevent strokes.  Diabetes screening involves taking a blood sample to check your fasting blood sugar level. This should be done once every 3 years, after age 34, if you are within normal weight and without risk factors for diabetes. Testing should be  considered at a younger age or be carried out more frequently if you are overweight and have at least 1 risk factor for diabetes.  Breast cancer screening is essential preventative care for women. You should practice "breast self-awareness." This means understanding the normal appearance and feel of your breasts and may include breast self-examination. Any changes detected, no matter how small, should be reported to a caregiver. Women in their 49s and 30s should have a clinical breast exam (CBE) by a caregiver as part of a regular health exam every 1 to 3 years. After age 86, women should have a CBE every year. Starting at age 14, women should consider having a mammogram (breast X-ray) every year. Women who have a family history of breast cancer should talk to their caregiver about genetic screening. Women at a high risk of breast cancer should talk to their caregiver about having an MRI and a mammogram every year.  Breast cancer gene (BRCA)-related cancer risk assessment is recommended for women who have family members with BRCA-related cancers. BRCA-related cancers include breast, ovarian, tubal, and peritoneal cancers. Having family members with these cancers may be associated with an increased risk for harmful changes (mutations) in the breast cancer genes BRCA1 and BRCA2. Results of the assessment will determine the need for genetic counseling and BRCA1 and BRCA2 testing.  The Pap test is a screening test for cervical cancer. Women should have a Pap test starting at age 9. Between ages 43 and 70, Pap tests should be repeated every 2 years. Beginning at age 45, you should have a Pap test every 3 years as long as the past 3 Pap tests have been normal. If you had a hysterectomy for a problem that was not cancer or a condition that could lead to cancer, then you no longer need Pap tests. If you are between ages 38 and 61, and you have had normal Pap tests going back 10 years, you no longer need Pap tests. If  you have had past treatment for cervical cancer or a condition that could lead to cancer, you need Pap tests and screening for cancer for at least 20 years after your treatment. If Pap tests have been discontinued, risk factors (such as a new sexual partner) need to be reassessed to determine if screening should be resumed. Some women have medical problems that increase the chance of getting cervical cancer. In these cases, your caregiver may recommend more frequent screening and Pap tests.  The human papillomavirus (HPV) test is an additional test that may be used for cervical cancer screening. The HPV test looks for the virus that can cause the cell changes on the cervix. The cells collected during the Pap test can be tested for HPV. The HPV test could be used to screen women aged 31 years and older, and should be used in women of any age who have unclear Pap test results. After the age of 49, women should have  HPV testing at the same frequency as a Pap test.  Colorectal cancer can be detected and often prevented. Most routine colorectal cancer screening begins at the age of 70 and continues through age 33. However, your caregiver may recommend screening at an earlier age if you have risk factors for colon cancer. On a yearly basis, your caregiver may provide home test kits to check for hidden blood in the stool. Use of a small camera at the end of a tube, to directly examine the colon (sigmoidoscopy or colonoscopy), can detect the earliest forms of colorectal cancer. Talk to your caregiver about this at age 57, when routine screening begins. Direct examination of the colon should be repeated every 5 to 10 years through age 69, unless early forms of pre-cancerous polyps or small growths are found.  Hepatitis C blood testing is recommended for all people born from [birth year] through 1965 and any individual with known risks for hepatitis C.  Practice safe sex. Use condoms and avoid high-risk sexual practices to  reduce the spread of sexually transmitted infections (STIs). Sexually active women aged 73 and younger should be checked for Chlamydia, which is a common sexually transmitted infection. Older women with new or multiple partners should also be tested for Chlamydia. Testing for other STIs is recommended if you are sexually active and at increased risk.  Osteoporosis is a disease in which the bones lose minerals and strength with aging. This can result in serious bone fractures. The risk of osteoporosis can be identified using a bone density scan. Women ages 14 and over and women at risk for fractures or osteoporosis should discuss screening with their caregivers. Ask your caregiver whether you should be taking a calcium supplement or vitamin D to reduce the rate of osteoporosis.  Menopause can be associated with physical symptoms and risks. Hormone replacement therapy is available to decrease symptoms and risks. You should talk to your caregiver about whether hormone replacement therapy is right for you.  Use sunscreen. Apply sunscreen liberally and repeatedly throughout the day. You should seek shade when your shadow is shorter than you. Protect yourself by wearing long sleeves, pants, a wide-brimmed hat, and sunglasses year round, whenever you are outdoors.  Notify your caregiver of new moles or changes in moles, especially if there is a change in shape or color. Also notify your caregiver if a mole is larger than the size of a pencil eraser.  Stay current with your immunizations. Document Released: 06/03/2011 Document Revised: 03/15/2013 Document Reviewed: 06/03/2011 [facility] Patient Information 2014 [place].

## 2014-04-25 NOTE — Addendum Note (Signed)
Addended by: Honor Loh on: 04/25/2014 05:19 PM   Modules accepted: Orders

## 2014-04-25 NOTE — Addendum Note (Signed)
Addended by: Honor Loh on: 04/25/2014 05:17 PM   Modules accepted: Orders

## 2014-04-25 NOTE — Progress Notes (Signed)
Subjective:    Patient ID: [name], female    DOB: 30-Nov-1956, 58 y.o.   MRN: [id number]  HPI Chief Complaint  Patient presents with   Annual Exam    W/Pap - Pt is not fasting   This chart was scribed for Michelle SidleKurt Lauenstein, MD by Andrew Auaven Small, ED Scribe. This patient was seen in room 11 and the patient's care was started at 4:38 PM.  HPI Comments: [name] is a 58 y.o. female who presents to the [facility] here for a physical. Pt reports she has been doing well. She reports she has finished he Hep C treatment. Pt is not fasting. Pt state she has been very healthy. She states she has changed her diet and has recently started juicing her foods. Pt reports she has loss 20 lb. She has not been on medication. Pt has h/o hysterectomy. Pt is requesting a pap smear. Pt had bony density a couple months ago. Pt reports she is soreness to stomach dut to past intestinal surgery in the past.  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 Authorizing Provider  acetaminophen (TYLENOL) 500 MG tablet Take 500-1,000 mg by mouth 2 (two) times daily as needed for pain. 1 tablet mornings and 2 tablets evening    Historical Provider, MD  diazepam (VALIUM) 5 MG tablet Take 1-2 tablets every 8 hours as needed for muscle spasm 05/06/13   Renne CriglerJoshua Geiple, PA-C  Menthol, Topical Analgesic, (BEN GAY) 1.4 % PTCH Apply 1 patch topically daily.    Historical Provider, MD  MILK THISTLE PO Take 8 drops by mouth 2 (two) times daily.     Historical Provider, MD  naproxen (NAPROSYN) 500 MG tablet Take 1 tablet (500 mg total) by mouth 2 (two) times daily. 05/06/13   Renne CriglerJoshua Geiple, PA-C  oxyCODONE (ROXICODONE) 5 MG immediate release tablet Take 1 tablet (5 mg total) by mouth every 4 (four) hours as needed for pain. 05/06/13   Renne CriglerJoshua Geiple, PA-C   Review of Systems  All other systems reviewed and are negative.  Objective:   Physical Exam  Nursing note and vitals reviewed. Constitutional: She is oriented to person, place, and time. She appears well-developed and well-nourished. No distress.  HENT:  Head: Normocephalic and atraumatic.  Right Ear: External ear normal.  Left Ear: External ear normal.  Nose: Nose normal.  Eyes: EOM are normal.  Neck: Normal range of motion. Neck supple. No tracheal deviation present. No thyromegaly present.  Cardiovascular: Normal rate, regular rhythm and normal heart sounds.  Exam reveals no gallop and no friction rub.   No murmur heard. Pulmonary/Chest: Effort normal and breath sounds normal. No respiratory distress. She has no wheezes. She has no rales. She exhibits no tenderness.  Abdominal: Soft. Bowel sounds are normal.  Abdomen tender to mesh where she had defect  Genitourinary: Vagina normal. Right adnexum displays no mass, no tenderness and no fullness.  Left adnexum displays no mass, no tenderness and no fullness.  Pt does not have a uterus  Musculoskeletal: Normal range of motion.  Lymphadenopathy:    She has no cervical adenopathy.  Neurological: She is alert and oriented to person, place, and time.  Skin: Skin is warm and dry.  Psychiatric: She has a normal mood and affect. Her behavior is normal.   patient has a small nodule on the left proximal shin where she had a placed in the past. Pulses are normal and she has no edema    Assessment & Plan:   Annual physical exam - Plan: POCT CBC, Comprehensive metabolic panel, Lipid panel, Pap IG (Image Guided), POCT urinalysis dipstick,  Hepatitis C RNA quantitative  Signed, Michelle Sidle, MD

## 2014-04-26 LAB — COMPREHENSIVE METABOLIC PANEL
ALT: 14 U/L (ref 0–35)
AST: 18 U/L (ref 0–37)
Albumin: 4.6 g/dL (ref 3.5–5.2)
Alkaline Phosphatase: 37 U/L — ABNORMAL LOW (ref 39–117)
BUN: 13 mg/dL (ref 6–23)
CO2: 25 mEq/L (ref 19–32)
Calcium: 9.4 mg/dL (ref 8.4–10.5)
Chloride: 104 mEq/L (ref 96–112)
Creat: 0.7 mg/dL (ref 0.50–1.10)
Glucose, Bld: 92 mg/dL (ref 70–99)
Potassium: 4.3 mEq/L (ref 3.5–5.3)
Sodium: 138 mEq/L (ref 135–145)
Total Bilirubin: 0.6 mg/dL (ref 0.2–1.2)
Total Protein: 7 g/dL (ref 6.0–8.3)

## 2014-04-26 LAB — LIPID PANEL
Cholesterol: 187 mg/dL (ref 0–200)
HDL: 52 mg/dL (ref >39)
LDL Cholesterol: 116 mg/dL — ABNORMAL HIGH (ref 0–99)
Total CHOL/HDL Ratio: 3.6 Ratio
Triglycerides: 97 mg/dL (ref <150)
VLDL: 19 mg/dL (ref 0–40)

## 2014-04-27 LAB — PAP IG (IMAGE GUIDED)

## 2014-04-27 LAB — HEPATITIS C RNA QUANTITATIVE: HCV Quantitative: NOT DETECTED IU/mL (ref <15)

## 2014-08-04 ENCOUNTER — Encounter: Payer: Self-pay | Admitting: Gastroenterology

## 2014-12-26 ENCOUNTER — Encounter: Payer: Self-pay | Admitting: Gastroenterology

## 2015-02-14 ENCOUNTER — Ambulatory Visit (AMBULATORY_SURGERY_CENTER): Payer: Self-pay | Admitting: *Deleted

## 2015-02-14 ENCOUNTER — Telehealth: Payer: Self-pay | Admitting: *Deleted

## 2015-02-14 VITALS — Ht 64.25 in | Wt 148.2 lb

## 2015-02-14 DIAGNOSIS — Z86010 Personal history of colonic polyps: Secondary | ICD-10-CM

## 2015-02-14 MED ORDER — NA SULFATE-K SULFATE-MG SULF 17.5-3.13-1.6 GM/177ML PO SOLN: ORAL | Status: DC

## 2015-02-14 NOTE — Progress Notes (Signed)
No allergies to eggs or soy. No problems with anesthesia.  Pt given Emmi instructions for colonoscopy  No oxygen use  No diet drug use  

## 2015-02-14 NOTE — Telephone Encounter (Signed)
ok 

## 2015-02-14 NOTE — Telephone Encounter (Signed)
Unable to reach pt. Will try tomorrow

## 2015-02-14 NOTE — Telephone Encounter (Signed)
Dr Arlyce DiceKaplan: pt is scheduled for recall colonoscopy 02/28/15.  During PV she says that she is having problems with choking on food 2 times a week. She is not currently taking Prilosec.   Last EGD with dilatation 2013.  She would like to have EGD when she has colonoscopy.  There is opening on the day of her colonoscopy to add both procedures.  Please advise.  Thanks, Olegario MessierKathy in [place].

## 2015-02-15 ENCOUNTER — Telehealth: Payer: Self-pay | Admitting: *Deleted

## 2015-02-15 NOTE — Telephone Encounter (Signed)
Called pt to let her know we were able to add EGD to her colonoscopy on 3/29, she will just need to be here 30 mins early. I advised pt of this and advised that her instructions will change for the prep times on the day of her procedure, she will do everything 30 mins earlier, pt states understanding, also advised pt to look over instructions and if she has any questions or in confused about the times to call us back, pt verbalizes understanding-adm

## 2015-02-20 ENCOUNTER — Telehealth: Payer: Self-pay | Admitting: Gastroenterology

## 2015-02-20 DIAGNOSIS — Z1211 Encounter for screening for malignant neoplasm of colon: Secondary | ICD-10-CM

## 2015-02-20 NOTE — Telephone Encounter (Signed)
Suprep resent to pharmacy. Marylene LandAngela PV

## 2015-02-23 ENCOUNTER — Ambulatory Visit (INDEPENDENT_AMBULATORY_CARE_PROVIDER_SITE_OTHER): Payer: BLUE CROSS/BLUE SHIELD | Admitting: Physician Assistant

## 2015-02-23 ENCOUNTER — Telehealth: Payer: Self-pay | Admitting: Gastroenterology

## 2015-02-23 VITALS — BP 120/80 | HR 88 | Temp 97.9°F | Resp 16 | Ht 64.5 in | Wt 147.0 lb

## 2015-02-23 DIAGNOSIS — Z13220 Encounter for screening for lipoid disorders: Secondary | ICD-10-CM | POA: Diagnosis not present

## 2015-02-23 DIAGNOSIS — Z23 Encounter for immunization: Secondary | ICD-10-CM | POA: Diagnosis not present

## 2015-02-23 DIAGNOSIS — Z9889 Other specified postprocedural states: Secondary | ICD-10-CM | POA: Diagnosis not present

## 2015-02-23 DIAGNOSIS — Z8619 Personal history of other infectious and parasitic diseases: Secondary | ICD-10-CM

## 2015-02-23 DIAGNOSIS — F902 Attention-deficit hyperactivity disorder, combined type: Secondary | ICD-10-CM | POA: Diagnosis not present

## 2015-02-23 DIAGNOSIS — Z131 Encounter for screening for diabetes mellitus: Secondary | ICD-10-CM

## 2015-02-23 DIAGNOSIS — Z0000 Encounter for general adult medical examination without abnormal findings: Secondary | ICD-10-CM | POA: Diagnosis not present

## 2015-02-23 DIAGNOSIS — Z9049 Acquired absence of other specified parts of digestive tract: Secondary | ICD-10-CM

## 2015-02-23 DIAGNOSIS — K222 Esophageal obstruction: Secondary | ICD-10-CM | POA: Diagnosis not present

## 2015-02-23 MED ORDER — AMPHETAMINE-DEXTROAMPHET ER 10 MG PO CP24: 10.0000 mg | ORAL_CAPSULE | Freq: Every day | ORAL | Status: DC

## 2015-02-23 MED ORDER — ZOSTER VACCINE LIVE 19400 UNT/0.65ML ~~LOC~~ SOLR: 0.6500 mL | Freq: Once | SUBCUTANEOUS | Status: DC

## 2015-02-23 NOTE — Telephone Encounter (Signed)
Mailed Miralax instructions to pt.  Marylene LandAngela PV

## 2015-02-23 NOTE — Progress Notes (Deleted)
   Subjective:    Patient ID: [name], female    DOB: 09/28/1956, 59 y.o.   MRN: [id number]  HPI  Complaints: problems with focus. Coffee helps. Was diagnosed with ADHD 9 years ago when he went on interferon for hep C treatment. Hasn't taking in 6 years. Got hep C from transfusion in 1982. Has had most of her colon removed from recurrent diverticulitis. Is incontinent once in a while.  LMP: Does not have uterus or ovaries. Last pap 1 year ago  Last pap: 1 year ago and normal. Immunizations: not sure when last tdap was. Dentist: states interferon made teeth soft, went to dentist 1 week ago for cleaning. She needs to get some cavities take care of Eye: wears contacts. Diet/Exercise: walks for exercise  Mammogram: had breast MRI 1 year ago and normal. She is on 2 year schedule. Colonoscopy: having colonoscopy in 5 days.  Review of Systems     Objective:   Physical Exam BP 120/80 mmHg  Pulse 88  Temp(Src) 97.9 F (36.6 C)  Resp 16  Ht 5' 4.5" (1.638 m)  Wt 147 lb (66.679 kg)  BMI 24.85 kg/m2  SpO2 95%        Assessment & Plan:

## 2015-02-23 NOTE — Patient Instructions (Addendum)
Keep up with diet and exercise.  I will call you with the results of your labs tests. Try the adderall daily and see how this does. We will reassess in 1 month to see if you need any dose changes. Return to see me in 1 month for follow up.

## 2015-02-24 LAB — TSH: TSH: 3.851 u[IU]/mL (ref 0.350–4.500)

## 2015-02-24 LAB — CBC
HCT: 41.8 % (ref 36.0–46.0)
Hemoglobin: 14.3 g/dL (ref 12.0–15.0)
MCH: 30.3 pg (ref 26.0–34.0)
MCHC: 34.2 g/dL (ref 30.0–36.0)
MCV: 88.6 fL (ref 78.0–100.0)
MPV: 9.9 fL (ref 8.6–12.4)
PLATELETS: 300 10*3/uL (ref 150–400)
RBC: 4.72 MIL/uL (ref 3.87–5.11)
RDW: 12.7 % (ref 11.5–15.5)
WBC: 6.7 10*3/uL (ref 4.0–10.5)

## 2015-02-24 LAB — LIPID PANEL
CHOL/HDL RATIO: 2.3 ratio
Cholesterol: 186 mg/dL (ref 0–200)
HDL: 80 mg/dL (ref >=46)
LDL CALC: 91 mg/dL (ref 0–99)
Triglycerides: 76 mg/dL (ref <150)
VLDL: 15 mg/dL (ref 0–40)

## 2015-02-24 LAB — COMPREHENSIVE METABOLIC PANEL
ALT: 14 U/L (ref 0–35)
AST: 15 U/L (ref 0–37)
Albumin: 4.7 g/dL (ref 3.5–5.2)
Alkaline Phosphatase: 34 U/L — ABNORMAL LOW (ref 39–117)
BILIRUBIN TOTAL: 0.7 mg/dL (ref 0.2–1.2)
BUN: 22 mg/dL (ref 6–23)
CALCIUM: 9.7 mg/dL (ref 8.4–10.5)
CHLORIDE: 101 meq/L (ref 96–112)
CO2: 28 meq/L (ref 19–32)
Creat: 0.84 mg/dL (ref 0.50–1.10)
Glucose, Bld: 100 mg/dL — ABNORMAL HIGH (ref 70–99)
Potassium: 3.8 mEq/L (ref 3.5–5.3)
SODIUM: 141 meq/L (ref 135–145)
TOTAL PROTEIN: 7.3 g/dL (ref 6.0–8.3)

## 2015-02-26 DIAGNOSIS — Z9049 Acquired absence of other specified parts of digestive tract: Secondary | ICD-10-CM | POA: Insufficient documentation

## 2015-02-26 DIAGNOSIS — F902 Attention-deficit hyperactivity disorder, combined type: Secondary | ICD-10-CM | POA: Insufficient documentation

## 2015-02-26 DIAGNOSIS — Z8619 Personal history of other infectious and parasitic diseases: Secondary | ICD-10-CM | POA: Insufficient documentation

## 2015-02-27 LAB — HEPATITIS C RNA QUANTITATIVE: HCV QUANT: NOT DETECTED [IU]/mL (ref <15)

## 2015-02-27 NOTE — Telephone Encounter (Signed)
Done

## 2015-02-28 ENCOUNTER — Encounter: Payer: Self-pay | Admitting: Gastroenterology

## 2015-02-28 ENCOUNTER — Ambulatory Visit (AMBULATORY_SURGERY_CENTER): Payer: BLUE CROSS/BLUE SHIELD | Admitting: Gastroenterology

## 2015-02-28 VITALS — BP 130/64 | HR 61 | Temp 97.3°F | Resp 23 | Ht 64.25 in | Wt 148.0 lb

## 2015-02-28 DIAGNOSIS — Z86010 Personal history of colonic polyps: Secondary | ICD-10-CM | POA: Diagnosis not present

## 2015-02-28 DIAGNOSIS — K222 Esophageal obstruction: Secondary | ICD-10-CM | POA: Diagnosis not present

## 2015-02-28 DIAGNOSIS — R1310 Dysphagia, unspecified: Secondary | ICD-10-CM | POA: Diagnosis not present

## 2015-02-28 MED ORDER — SODIUM CHLORIDE 0.9 % IV SOLN: 500.0000 mL | INTRAVENOUS | Status: DC

## 2015-02-28 NOTE — Patient Instructions (Signed)
Discharge instructions given. Handout on a dilatation diet. Resume previous medications. YOU HAD AN ENDOSCOPIC PROCEDURE TODAY AT THE [facility]:   Refer to the procedure report that was given to you for any specific questions about what was found during the examination.  If the procedure report does not answer your questions, please call your gastroenterologist to clarify.  If you requested that your care partner not be given the details of your procedure findings, then the procedure report has been included in a sealed envelope for you to review at your convenience later.  YOU SHOULD EXPECT: Some feelings of bloating in the abdomen. Passage of more gas than usual.  Walking can help get rid of the air that was put into your GI tract during the procedure and reduce the bloating. If you had a lower endoscopy (such as a colonoscopy or flexible sigmoidoscopy) you may notice spotting of blood in your stool or on the toilet paper. If you underwent a bowel prep for your procedure, you may not have a normal bowel movement for a few days.  Please Note:  You might notice some irritation and congestion in your nose or some drainage.  This is from the oxygen used during your procedure.  There is no need for concern and it should clear up in a day or so.  SYMPTOMS TO REPORT IMMEDIATELY:   Following lower endoscopy (colonoscopy or flexible sigmoidoscopy):  Excessive amounts of blood in the stool  Significant tenderness or worsening of abdominal pains  Swelling of the abdomen that is new, acute  Fever of 100F or higher   Following upper endoscopy (EGD)  Vomiting of blood or coffee ground material  New chest pain or pain under the shoulder blades  Painful or persistently difficult swallowing  New shortness of breath  Fever of 100F or higher  Black, tarry-looking stools  For urgent or emergent issues, a gastroenterologist can be reached at any hour by calling [phone].   DIET:  Your first meal following the procedure should be a small meal and then it is ok to progress to your normal diet. Heavy or fried foods are harder to digest and may make you feel nauseous or bloated.  Likewise, meals heavy in dairy and vegetables can increase bloating.  Drink plenty of fluids but you should avoid alcoholic beverages for 24 hours.  ACTIVITY:  You should plan to take it easy for the rest of today and you should NOT DRIVE or use heavy machinery until tomorrow (because of the sedation medicines used during the test).    FOLLOW UP: Our staff will call the number listed on your records the next business day following your procedure to check on you and address any questions or concerns that you may have regarding the information given to you following your procedure. If we do not reach you, we will leave a message.  However, if you are feeling well and you are not experiencing any problems, there is no need to return our call.  We will assume that you have returned to your regular daily activities without incident.  If any biopsies were taken you will be contacted by phone or by letter within the next 1-3 weeks.  Please call us at [phone] if you have not heard about the biopsies in 3 weeks.    SIGNATURES/CONFIDENTIALITY: You and/or your care partner have signed paperwork which will be entered into your electronic medical record.  These signatures attest to the fact that that   the information above on your After Visit Summary has been reviewed and is understood.  Full responsibility of the confidentiality of this discharge information lies with you and/or your care-partner. 

## 2015-02-28 NOTE — Progress Notes (Signed)
Subjective:    Patient ID: Huntley Dec, female    DOB: May 05, 1956, 59 y.o.   MRN: [id number]  HPI   This is a 59 year old female with PMH Hep C in remission who is presenting for CPE.  Pt was treated for hep C 9 years ago with interferon. Thinks she got hep C from transfusion in 1982. She does not see anyone presently for Hep C. She has had several GI problems from the interferon. She states "the interferon attacked my colon". She reports she had ulcers in her colon which eventually ruptured and she had to have an almost total colectomy. She is occasionally incontinent d/t this but is overall doing very well. She sees GI on a regular basis. She also sees GI for recurrent esophageal strictures. She has had multiple dilations before. She states if she is is lucky a dilation will leave her symptom free for max 1 year before symptoms return. She is scheduled for an upper endoscopy and colonoscopy in 5 days.  She states 9 years ago when she started the interferon she saw a psychiatrist who diagnosed her with ADHD. She was prescribed adderall. She states is worked really well for her while on the interferon but a couple years after she stopped d/t not wanting to be on medication. She has not been on anything since. She states in the past year she has noticed more problems with focusing and staying on task. She states "I've always been hyper". She drinks several cups of coffee every day and states "coffee calms me down". She wakes several times during the night but states this has always been normal for her. She is a Geographical information systems officer for work. She enjoys her job. She states her co-workers point out her inability to complete tasks.  Last pap: 1 year ago and normal. She has had a total hysterectomy and salpingo-oophorectomy. Immunizations: not sure when last tdap was. She is wanting the zostavax vaccine since she has had several friends get shingles. She had chicken pox as a child. Dentist: states  interferon made teeth soft, went to dentist 1 week ago for cleaning. She needs to get some cavities take care of. Eye: wears contacts, no change in vision. Diet/Exercise: walks for exercise and eats very healthy. BMI normal.  Mammogram: had breast MRI 1 year ago and normal. She is on 2 year schedule. Colonoscopy: having colonoscopy in 5 days.  Review of Systems  Constitutional: Negative for fever and chills.  HENT: Positive for trouble swallowing.   Eyes: Negative for visual disturbance.  Respiratory: Negative for cough and shortness of breath.   Cardiovascular: Negative for chest pain, palpitations and leg swelling.  Gastrointestinal: Positive for abdominal pain and diarrhea. Negative for nausea and vomiting.  Endocrine: Negative.   Genitourinary: Negative for vaginal bleeding, vaginal discharge and difficulty urinating.  Musculoskeletal: Negative for arthralgias.  Skin: Negative for rash.  Allergic/Immunologic: Negative for environmental allergies.  Neurological: Negative for dizziness, numbness and headaches.  Hematological: Negative for adenopathy.  Psychiatric/Behavioral: Positive for sleep disturbance and decreased concentration. Negative for self-injury and dysphoric mood. The patient is hyperactive.     Patient Active Problem List   Diagnosis Date Noted  . History of hepatitis C 02/26/2015  . H/O colectomy 02/26/2015  . Stricture and stenosis of esophagus 05/22/2012  . Recurrent ventral incisional hernia s/p lap repair with mesh 9/3. 05/20/2012  . ADD 02/28/2010  . ABDOMINAL PAIN-MULTIPLE SITES 02/28/2010  . DIVERTICULITIS, HX OF 02/28/2010  . OTHER  ANXIETY STATES 10/02/2009  . HEMORRHAGE OF RECTUM AND ANUS 10/02/2009   Home meds: none  No Known Allergies  Patient's social and family history were reviewed.     Objective:   Physical Exam  Constitutional: She is oriented to person, place, and time.  HENT:  Head: Normocephalic and atraumatic.  Right Ear: Hearing,  tympanic membrane, external ear and ear canal normal.  Left Ear: Hearing, tympanic membrane, external ear and ear canal normal.  Nose: Nose normal.  Mouth/Throat: Uvula is midline, oropharynx is clear and moist and mucous membranes are normal.  Eyes: Conjunctivae, EOM and lids are normal. Pupils are equal, round, and reactive to light. Right eye exhibits no discharge. Left eye exhibits no discharge. No scleral icterus.  Neck: Trachea normal. Carotid bruit is not present. No thyromegaly present.  Cardiovascular: Normal rate, regular rhythm, normal heart sounds, intact distal pulses and normal pulses.   No murmur heard. Pulmonary/Chest: Effort normal and breath sounds normal. She has no wheezes. She has no rhonchi. She has no rales.  Abdominal: Soft. Normal appearance and bowel sounds are normal. She exhibits no abdominal bruit. There is no tenderness.  Musculoskeletal: Normal range of motion.  Lymphadenopathy:       Head (right side): No submental, no submandibular, no tonsillar, no preauricular, no posterior auricular and no occipital adenopathy present.       Head (left side): No submental, no submandibular, no tonsillar, no preauricular, no posterior auricular and no occipital adenopathy present.    She has no cervical adenopathy.  Neurological: She is alert and oriented to person, place, and time. She has normal strength and normal reflexes. No cranial nerve deficit or sensory deficit. Coordination and gait normal.  Skin: Skin is warm, dry and intact. No lesion and no rash noted.  Psychiatric: She has a normal mood and affect. Her behavior is normal. Thought content normal.  Thought content normal but pt is constantly talking and often loses train of thought   BP 120/80 mmHg  Pulse 88  Temp(Src) 97.9 F (36.6 C)  Resp 16  Ht 5' 4.5" (1.638 m)  Wt 147 lb (66.679 kg)  BMI 24.85 kg/m2  SpO2 95%     Assessment & Plan:  1. Annual physical exam 2. Lipid screening 3. Screening for DM -  CBC - Lipid panel -CMP  3. Attention deficit hyperactivity disorder (ADHD), combined type Pt was dx'd with ADHD 9 years ago. Adderall helped her but she came off d/t not wanting to be on medications. She is having a hard time focusing at home and at work currently. TSH pending. Adderall prescribed to take every morning. She will return in 1 month for recheck. - TSH - amphetamine-dextroamphetamine (ADDERALL XR) 10 MG 24 hr capsule; Take 1 capsule (10 mg total) by mouth daily. (Patient not taking: Reported on 02/28/2015)  Dispense: 30 capsule; Refill: 0  5. History of hepatitis C In remission x 9 years. Will check today. - Hepatitis C RNA quantitative  6. Need for zoster vaccine - zoster vaccine live, PF, (ZOSTAVAX) [id number] UNT/0.65ML injection; Inject 19,400 Units into the skin once. (Patient not taking: Reported on 02/28/2015)  Dispense: 1 each; Refill: 0  7. Need for Tdap vaccination - Tdap vaccine greater than or equal to 7yo IM  8. H/O colectomy 9. Esophageal stricture Follow up with GI in 5 days for colonoscopy and upper endoscopy.   Roswell MinersNicole V. Dyke BrackettBush, PA-C, MHS [facility]  02/28/2015

## 2015-02-28 NOTE — Progress Notes (Signed)
Called to room to assist during endoscopic procedure.  Patient ID and intended procedure confirmed with present staff. Received instructions for my participation in the procedure from the performing physician.  

## 2015-02-28 NOTE — Op Note (Signed)
[facility] [address] [place], [id number]   COLONOSCOPY PROCEDURE REPORT  PATIENT: [name], [name]  MR#: [id number] BIRTHDATE: 08-Dec-1955 , 58  yrs. old GENDER: female ENDOSCOPIST: Louis Meckelobert D Modupe Shampine, MD REFERRED BY: PROCEDURE DATE:  02/28/2015 PROCEDURE:   Colonoscopy, surveillance First Screening Colonoscopy - Avg.  risk and is 50 yrs.  old or older - No.  Prior Negative Screening - Now for repeat screening. N/A  History of Adenoma - Now for follow-up colonoscopy & has been > or = to 3 yrs.  Yes hx of adenoma.  Has been 3 or more years since last colonoscopy. 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 Status post subtotal colectomy for diverticular disease MEDICATIONS: Monitored anesthesia care and Propofol 250 mg IV  DESCRIPTION OF PROCEDURE:   After the risks benefits and alternatives of the procedure were thoroughly explained, informed consent was obtained.  The digital rectal exam revealed no abnormalities of the rectum.   The LB [id number]  endoscope was introduced through the anus and advanced to the surgical anastomosis. No adverse events experienced.   The quality of the prep was (Suprep was used) excellent.  The instrument was then slowly withdrawn as the colon was fully examined.      COLON FINDINGS: status post subtotal colectomy with approximately 25 cm of rectum and sigmoid.  Anastomosis and ileum were unremarkable. The remaining colon including rectum and sigmoid were also unremarkable.  Retroflexed views revealed no abnormalities. The time to anastomosis= 1.Withdrawal time = 6.5   The scope was withdrawn and the procedure completed. COMPLICATIONS: There were no immediate complications.  ENDOSCOPIC IMPRESSION: normal colonoscopy (postoperative changes)  RECOMMENDATIONS: colonoscopy 10 years  eSigned:  Louis Meckelobert D Derrin Currey, MD 02/28/2015 9:16 AM   cc: Lanier ClamNicole Bush, MD

## 2015-02-28 NOTE — Op Note (Signed)
[facility] [address] [place], [id number]   ENDOSCOPY PROCEDURE REPORT  PATIENT: [name], [name]  MR#: [id number] BIRTHDATE: 1956/01/19 , 58  yrs. old GENDER: female ENDOSCOPIST: Louis Meckelobert D Kaplan, MD REFERRED BY: PROCEDURE DATE:  02/28/2015 PROCEDURE:  EGD, diagnostic and Maloney dilation of esophagus ASA CLASS:     Class II INDICATIONS:  dysphagia. MEDICATIONS: Residual sedation present, Monitored anesthesia care, and Propofol 150 mg IV TOPICAL ANESTHETIC:  DESCRIPTION OF PROCEDURE: After the risks benefits and alternatives of the procedure were thoroughly explained, informed consent was obtained.  The LB [id number] endoscope was introduced through the mouth and advanced to the second portion of the duodenum , Without limitations.  The instrument was slowly withdrawn as the mucosa was fully examined.    ESOPHAGUS: There was [name] peptic stricture at the gastroesophageal junction.  The stricture was traversable.  The stricture was dilated using [name] 18mm (52Fr) Maloney dilator.   Except for the findings listed, the EGD was otherwise normal.  Retroflexed views revealed no abnormalities.     The scope was then withdrawn from the patient and the procedure completed.  COMPLICATIONS: There were no immediate complications.  ENDOSCOPIC IMPRESSION: 1.   There was [name] stricture at the gastroesophageal junction; The stricture was dilated using [name] 18mm (52Fr) Maloney dilator 2.   EGD was otherwise normal  RECOMMENDATIONS: peak dilation as needed  REPEAT EXAM:  eSigned:  Louis Meckelobert D Kaplan, MD 02/28/2015 9:21 AM    CC: Lanier ClamNicole Bush, MD

## 2015-02-28 NOTE — Progress Notes (Signed)
Procedure endsa, to recovery, report to Baker Hughes IncorporatedMcCoy, RN, VSS

## 2015-03-01 ENCOUNTER — Telehealth: Payer: Self-pay

## 2015-03-01 ENCOUNTER — Encounter: Payer: Self-pay | Admitting: Physician Assistant

## 2015-03-01 NOTE — Telephone Encounter (Signed)
  Follow up Call-  Call back number 02/28/2015 06/11/2012  Post procedure Call Back phone  # [phone]  Permission to leave phone message Yes Yes     Patient questions:  Do you have a fever, pain , or abdominal swelling? No. Pain Score  0 *  Have you tolerated food without any problems? Yes.    Have you been able to return to your normal activities? Yes.    Do you have any questions about your discharge instructions: Diet   No. Medications  No. Follow up visit  No.  Do you have questions or concerns about your Care? No.  Actions: * If pain score is 4 or above: No action needed, pain <4.  No problems per the pt. maw

## 2015-04-04 ENCOUNTER — Encounter: Payer: Self-pay | Admitting: Physician Assistant

## 2015-04-04 ENCOUNTER — Ambulatory Visit (INDEPENDENT_AMBULATORY_CARE_PROVIDER_SITE_OTHER): Payer: BLUE CROSS/BLUE SHIELD | Admitting: Physician Assistant

## 2015-04-04 VITALS — BP 126/80 | HR 76 | Temp 98.1°F | Resp 16 | Ht 65.0 in | Wt 151.2 lb

## 2015-04-04 DIAGNOSIS — F902 Attention-deficit hyperactivity disorder, combined type: Secondary | ICD-10-CM

## 2015-04-04 DIAGNOSIS — K222 Esophageal obstruction: Secondary | ICD-10-CM

## 2015-04-04 MED ORDER — AMPHETAMINE-DEXTROAMPHET ER 10 MG PO CP24: 10.0000 mg | ORAL_CAPSULE | Freq: Every day | ORAL | Status: DC

## 2015-04-04 NOTE — Progress Notes (Signed)
Subjective:    Patient ID: [name], female    DOB: June 05, 1956, 59 y.o.   MRN: [id number]  HPI  This is a 59 year old female with PMH ADHD, esophageal stricture, hx hep C in remission, hx diverticulitis s/p colectomy who is here to follow up on ADHD. Last visit she was started on adderall 10 mg XR. States this is working very well for her. She is calmer and her thoughts are more organized. She is able to get things done at work. Her co-workers have mentioned that they notice a difference. States it is helping her anxiety and she is sleeping much better. Before she was waking in the night several times feeling anxious and this isn't happening anymore. She also notices that she is eating more. She states before she couldn't sit still long enough to eat a meal and now she is able to. She has gained 3 pounds in a month. She notices the adderall starts to wear off around 3 pm but she feels it stays in her system after that long enough for her to carry on her nightly duties. She denies CP, SOB, palpitations. She is still drinking 2-3 cups of coffee a day.  Got shingles vaccine. States she felt sick for 1 week after. Feeling better now.  Had colonoscopy and EGD. Colonoscopy normal. Dilation of esophageal stricture went well.  Review of Systems  Constitutional: Negative.  Negative for fever and chills.  HENT: Negative for trouble swallowing.   Respiratory: Negative for shortness of breath.   Cardiovascular: Negative for chest pain and palpitations.  Gastrointestinal: Negative for nausea, vomiting and abdominal pain.  Hematological: Negative for adenopathy.  Psychiatric/Behavioral: Negative for sleep disturbance and decreased concentration. The patient is not nervous/anxious.     Patient Active Problem List   Diagnosis Date Noted  . History of hepatitis C 02/26/2015  . H/O colectomy 02/26/2015  . Stricture and stenosis of esophagus 05/22/2012  . Recurrent ventral incisional hernia s/p  lap repair with mesh 9/3. 05/20/2012  . ADD 02/28/2010  . ABDOMINAL PAIN-MULTIPLE SITES 02/28/2010  . DIVERTICULITIS, HX OF 02/28/2010  . OTHER ANXIETY STATES 10/02/2009  . HEMORRHAGE OF RECTUM AND ANUS 10/02/2009   Prior to Admission medications   Medication Sig Start Date End Date Taking? Authorizing Provider  amphetamine-dextroamphetamine (ADDERALL XR) 10 MG 24 hr capsule Take 1 capsule (10 mg total) by mouth daily. 04/04/15  Yes Lanier ClamNicole Bush V, PA-C  amphetamine-dextroamphetamine (ADDERALL XR) 10 MG 24 hr capsule Take 1 capsule (10 mg total) by mouth daily. May fill 30 days after written. 04/04/15   Dorna LeitzNicole Bush V, PA-C  amphetamine-dextroamphetamine (ADDERALL XR) 10 MG 24 hr capsule Take 1 capsule (10 mg total) by mouth daily. May fill 60 days after written 04/04/15   Dorna LeitzNicole Bush V, PA-C  zoster vaccine live, PF, (ZOSTAVAX) [id number] UNT/0.65ML injection Inject 19,400 Units into the skin once. Patient not taking: Reported on 02/28/2015 02/23/15   Dorna LeitzNicole Bush V, PA-C   Not on File  Patient's social and family history were reviewed.     Objective:   Physical Exam  Constitutional: She is oriented to person, place, and time. She appears well-developed and well-nourished. No distress.  HENT:  Head: Normocephalic and atraumatic.  Right Ear: Hearing normal.  Left Ear: Hearing normal.  Nose: Nose normal.  Eyes: Conjunctivae and lids are normal. Right eye exhibits no discharge. Left eye exhibits no discharge. No scleral icterus.  Cardiovascular: Normal rate, regular rhythm, normal heart sounds and  normal pulses.   No murmur heard. Pulmonary/Chest: Effort normal and breath sounds normal. No respiratory distress. She has no wheezes. She has no rhonchi. She has no rales.  Musculoskeletal: Normal range of motion.  Neurological: She is alert and oriented to person, place, and time.  Skin: Skin is warm, dry and intact. No lesion and no rash noted.  Psychiatric: She has a normal mood and affect. Her speech  is normal and behavior is normal. Thought content normal.   BP 126/80 mmHg  Pulse 76  Temp(Src) 98.1 F (36.7 C) (Oral)  Resp 16  Ht  (1.651 m)  Wt 151 lb 3.2 oz (68.584 kg)  BMI 25.16 kg/m2  SpO2 100%     Assessment & Plan:  1. Attention deficit hyperactivity disorder (ADHD), combined type Symptoms are significantly improved with adderall 10 mg XR. It has also improved her sleep, appetite and anxiety. Three months of Rx given. She will call in 3 months and will write for 3 more Rx. Return in 6 months for follow up.  - amphetamine-dextroamphetamine (ADDERALL XR) 10 MG 24 hr capsule; Take 1 capsule (10 mg total) by mouth daily.  Dispense: 30 capsule; Refill: 0 - amphetamine-dextroamphetamine (ADDERALL XR) 10 MG 24 hr capsule; Take 1 capsule (10 mg total) by mouth daily. May fill 30 days after written.  Dispense: 30 capsule; Refill: 0 - amphetamine-dextroamphetamine (ADDERALL XR) 10 MG 24 hr capsule; Take 1 capsule (10 mg total) by mouth daily. May fill 60 days after written  Dispense: 30 capsule; Refill: 0  2. Esophageal stricture Difficulty swallowing improved after dilation of stricture.   [place] Brackett, MHS [facility]  04/04/2015

## 2015-04-04 NOTE — Patient Instructions (Signed)
Call in 3 months when you are ready for refill and I will fill and you can pick up prescription. Return in 6 months for follow up Let me know if you have any problems in the meantime

## 2015-05-07 ENCOUNTER — Telehealth: Payer: Self-pay | Admitting: Physician Assistant

## 2015-05-07 NOTE — Telephone Encounter (Signed)
Patient states that she has misplaced her Adderrall script. She was last seen on 04/04/2015 by Lanier ClamNicole Bush, PA-C. Can patient have another copy?  [phone]

## 2015-05-07 NOTE — Telephone Encounter (Signed)
nicole- How do you want to handle this?

## 2015-05-08 NOTE — Telephone Encounter (Signed)
PATIENT   FOUND THE SCRIPTS.  Disregard the previous message.

## 2015-05-08 NOTE — Telephone Encounter (Signed)
Ill be back tomorrow 6/7 12-6. I'll handle it then.

## 2015-07-15 ENCOUNTER — Emergency Department (HOSPITAL_COMMUNITY)
Admission: EM | Admit: 2015-07-15 | Discharge: 2015-07-16 | Disposition: A | Discharge status: Home / Self Care | Payer: BLUE CROSS/BLUE SHIELD | Attending: Emergency Medicine | Admitting: Emergency Medicine

## 2015-07-15 ENCOUNTER — Encounter (HOSPITAL_COMMUNITY): Payer: Self-pay | Admitting: Emergency Medicine

## 2015-07-15 ENCOUNTER — Emergency Department (HOSPITAL_COMMUNITY): Payer: BLUE CROSS/BLUE SHIELD

## 2015-07-15 DIAGNOSIS — S96921A Laceration of unspecified muscle and tendon at ankle and foot level, right foot, initial encounter: Secondary | ICD-10-CM | POA: Diagnosis not present

## 2015-07-15 DIAGNOSIS — Z8719 Personal history of other diseases of the digestive system: Secondary | ICD-10-CM | POA: Diagnosis not present

## 2015-07-15 DIAGNOSIS — W25XXXA Contact with sharp glass, initial encounter: Secondary | ICD-10-CM | POA: Diagnosis not present

## 2015-07-15 DIAGNOSIS — Y9389 Activity, other specified: Secondary | ICD-10-CM | POA: Insufficient documentation

## 2015-07-15 DIAGNOSIS — Z8619 Personal history of other infectious and parasitic diseases: Secondary | ICD-10-CM | POA: Diagnosis not present

## 2015-07-15 DIAGNOSIS — Z8669 Personal history of other diseases of the nervous system and sense organs: Secondary | ICD-10-CM | POA: Insufficient documentation

## 2015-07-15 DIAGNOSIS — Z8739 Personal history of other diseases of the musculoskeletal system and connective tissue: Secondary | ICD-10-CM | POA: Insufficient documentation

## 2015-07-15 DIAGNOSIS — Y998 Other external cause status: Secondary | ICD-10-CM | POA: Insufficient documentation

## 2015-07-15 DIAGNOSIS — F909 Attention-deficit hyperactivity disorder, unspecified type: Secondary | ICD-10-CM | POA: Diagnosis not present

## 2015-07-15 DIAGNOSIS — Y9289 Other specified places as the place of occurrence of the external cause: Secondary | ICD-10-CM | POA: Insufficient documentation

## 2015-07-15 DIAGNOSIS — S86021D Laceration of right Achilles tendon, subsequent encounter: Secondary | ICD-10-CM | POA: Diagnosis not present

## 2015-07-15 DIAGNOSIS — S91011A Laceration without foreign body, right ankle, initial encounter: Secondary | ICD-10-CM | POA: Diagnosis present

## 2015-07-15 DIAGNOSIS — Z87891 Personal history of nicotine dependence: Secondary | ICD-10-CM | POA: Insufficient documentation

## 2015-07-15 DIAGNOSIS — Z79899 Other long term (current) drug therapy: Secondary | ICD-10-CM | POA: Insufficient documentation

## 2015-07-15 MED ORDER — SODIUM CHLORIDE 0.9 % IV BOLUS (SEPSIS)
500.0000 mL | Freq: Once | INTRAVENOUS | Status: AC
  Administered 2015-07-15: 500 mL via INTRAVENOUS

## 2015-07-15 MED ORDER — HYDROCODONE-ACETAMINOPHEN 5-325 MG PO TABS: 1.0000 | ORAL_TABLET | Freq: Four times a day (QID) | ORAL | Status: DC | PRN

## 2015-07-15 MED ORDER — LIDOCAINE-EPINEPHRINE 2 %-1:100000 IJ SOLN
20.0000 mL | Freq: Once | INTRAMUSCULAR | Status: AC
  Administered 2015-07-15: 20 mL via INTRADERMAL
  Filled 2015-07-15: qty 1

## 2015-07-15 MED ORDER — MORPHINE SULFATE 4 MG/ML IJ SOLN
4.0000 mg | Freq: Once | INTRAMUSCULAR | Status: AC
  Administered 2015-07-15: 4 mg via INTRAVENOUS
  Filled 2015-07-15: qty 1

## 2015-07-15 MED ORDER — ONDANSETRON HCL 4 MG/2ML IJ SOLN
4.0000 mg | Freq: Once | INTRAMUSCULAR | Status: AC
  Administered 2015-07-15: 4 mg via INTRAVENOUS
  Filled 2015-07-15: qty 2

## 2015-07-15 NOTE — ED Notes (Signed)
Bed: WA09 Expected date:  Expected time:  Means of arrival:  Comments: EMS 59 yo female laceration right ankle/3 inches-unable to move foot

## 2015-07-15 NOTE — ED Provider Notes (Signed)
Dr. Adriana Simas request for me to perform laceration repair on a R ankle laceration.  Pt has a V-shaped deep laceration to posterior R ankle with complete laceration of Achilles Tendon.  Unable to repair the tendon.  Wound was sutured by me.  Pt will need to f/u with orthopedist for tendon repair.  Otherwise no nerve involvement.    LACERATION REPAIR Performed by: Donnetta Hutching Authorized byDonnetta Hutching Consent: Verbal consent obtained. Risks and benefits: risks, benefits and alternatives were discussed Consent given by: patient Patient identity confirmed: provided demographic data Prepped and Draped in normal sterile fashion Wound explored  Laceration Location: right ankle, posterior (achilles tendon involvement)  Laceration Length: 6cm  No Foreign Bodies seen or palpated  Anesthesia: local infiltration  Local anesthetic: lidocaine 2% w epinephrine  Anesthetic total: 4 ml  Irrigation method: syringe Amount of cleaning: standard  Skin closure: prolene 4.0  Number of sutures: 9  Technique: simple interrupted.   Patient tolerance: Patient tolerated the procedure well with no immediate complications.   Fayrene Helper, PA-C 07/15/15 2309  Medical screening examination/treatment/procedure(s) were conducted as a shared visit with non-physician practitioner(s) and myself.  I personally evaluated the patient during the encounter.   EKG Interpretation None     I have seen and examined patient. She has a V shaped laceration on her posterior distal right ankle. There is an obvious Achilles tendon injury. Wound was repaired by physician assistant. Referral to orthopedics for further evaluation and definitive repair of tendon injury.  Donnetta Hutching, MD 07/16/15 [phone]

## 2015-07-15 NOTE — ED Notes (Signed)
Brought in by EMS from home with c/o right ankle laceration.  Pt reports that a large piece of broken glass fell over her right lower leg (right ankle) and sustained a large and deep laceration.  Per EMS, scant amount of bleeding was noted from laceration.  Pressure dressing was applied by EMS.  Pt was given fentanyl 150 mcg IV en route to ED d/t severe pain to the lacerated area.  Pt arrived to ED with pressure dressing clean, dry and intact.

## 2015-07-15 NOTE — ED Provider Notes (Signed)
CSN: [id number]     Arrival date & time 07/15/15  1957 History   First MD Initiated Contact with Patient 07/15/15 2014     Chief Complaint  Patient presents with  . Extremity Laceration     (Consider location/radiation/quality/duration/timing/severity/associated sxs/prior Treatment) HPI   59 year old female presenting for evaluation of right ankle injury. Patient states approximately 3 hours ago she was getting rid of a piece of broken glass. She initially put the broken glass in a trash bag and brought in outside to throw into the trash can. In the process the looking glass fell and cut the back of her right ankle. She reports acute onset of sharp severe, nonradiating pain with a deep laceration to the affected area. She was unable to ambulate afterward. She is up-to-date with tetanus.  Past Medical History  Diagnosis Date  . Hepatitis C   . GERD (gastroesophageal reflux disease)   . Diverticulitis large intestine   . Anxiety   . 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 H/O hiatal hernia   . Insomnia   . Frequency   . Urination, excessive at night   . 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 ADHD (attention deficit hyperactivity disorder)    Past Surgical History  Procedure Laterality Date  . Abdominal surgery  ~ 2006    "got toothpick out of intestines"  . Hernia repair  2011  . Ureathal cyst    . Ectopic pregnancy surgery  1982    "twins"  . Leg tendon surgery  ~ 2005    left; "still have a bolt in it"  . Diagnostic laparoscopy  08/04/2012    LOA; VHR w/mesh  . Appendectomy  2011  . Cholecystectomy  2006  . Vaginal hysterectomy  1990's  . Ventral hernia repair  08/04/2012    Procedure: LAPAROSCOPIC VENTRAL HERNIA;  Surgeon: Adolph Pollack, MD;  Location: [facility] OR;  Service: General;  Laterality: N/A;  laparoscopic possible open ventral hernia repair with mesh  . Colectomy  2011    subtotal    Family History  Problem Relation Age of Onset  .  Colon cancer Maternal Uncle   . Colon polyps Neg Hx   . Prostate cancer Neg Hx   . Rectal cancer Neg Hx   . Heart disease Mother   . Heart disease Father   . Arthritis Sister    Social History  Substance Use Topics  . Smoking status: Former Smoker -- 4 years    Types: Cigarettes    Quit date: 12/02/1994  . Smokeless tobacco: Never Used     Comment: 08/04/2012 "quit smoking cigarettes 15-20 yr ago; really a recreational thing"  . Alcohol Use: 2.4 oz/week    4 Glasses of wine per week   OB History    No data available     Review of Systems  Constitutional: Negative for fever.  Skin: Positive for wound. Negative for rash.  Neurological: Negative for numbness.      Allergies  Review of patient's allergies indicates no known allergies.  Home Medications   Prior to Admission medications   Medication Sig Start Date End Date Taking? Authorizing Provider  amphetamine-dextroamphetamine (ADDERALL XR) 10 MG 24 hr capsule Take 1 capsule (10 mg total) by mouth daily. 04/04/15  Yes Lanier Clam V, PA-C  zoster vaccine live, PF, (ZOSTAVAX) 40981 UNT/0.65ML injection Inject 19,400 Units into the skin once. Patient not taking: Reported on 02/28/2015 02/23/15  Lanier Clam V, PA-C   BP 131/83 mmHg  Pulse 86  Temp(Src) 98.3 F (36.8 C) (Oral)  Resp 19  Ht  (1.626 m)  Wt 145 lb (65.772 kg)  BMI 24.88 kg/m2  SpO2 96% Physical Exam  Constitutional: She appears well-developed and well-nourished. No distress.  HENT:  Head: Atraumatic.  Eyes: Conjunctivae are normal.  Neck: Neck supple.  Cardiovascular: Intact distal pulses.   Musculoskeletal: She exhibits tenderness (Laceration noted to the dorsums of right ankle involving the Achilles tendon. Patient unable to dorsiflex and plantarflex secondary to pain. No foreign object noted.).  Neurological: She is alert.  Skin: No rash noted.  V-shaped deep laceration to posterior R ankle with complete laceration of Achilles Tendon   Psychiatric: She has a normal mood and affect.  Nursing note and vitals reviewed.   ED Course  Procedures (including critical care time) Labs Review Labs Reviewed - No data to display    Pt has a V-shaped deep laceration to posterior R ankle with complete laceration of Achilles Tendon. Unable to repair the tendon. Wound was sutured by me. Pt will need to f/u with orthopedist for tendon repair. Otherwise no nerve involvement. Patient placed in cam walker and crutches. She will follow-up. Care discussed with Dr. Adriana Simas who initially evaluated pt and request my help with documentation and suture repair.  I did discussed with Dr. Adriana Simas in regard to contacting our oncall orthopedist.  He felt pt can f/u outpt.  Pt is made aware of plan.  She will contact the office on Monday.  Work note provided.   LACERATION REPAIR Performed by: Fayrene Helper  Authorized byFayrene Helper  Consent: Verbal consent obtained. Risks and benefits: risks, benefits and alternatives were discussed Consent given by: patient  Patient identity confirmed: provided demographic data  Prepped and Draped in normal sterile fashion  Wound explored  Laceration Location: right ankle, posterior (achilles tendon involvement)  Laceration Length: 6cm  No Foreign Bodies seen or palpated  Anesthesia: local infiltration  Local anesthetic: lidocaine 2% w epinephrine  Anesthetic total: 4 ml  Irrigation method: syringe Amount of cleaning: standard  Skin closure: prolene 4.0  Number of sutures: 9  Technique: simple interrupted.   Patient tolerance: Patient tolerated the procedure well with no immediate complications.  Fayrene Helper, PA-C  07/15/15 2309   Imaging Review Dg Ankle Complete Right  07/15/2015   CLINICAL DATA:  Posterior right ankle laceration from falling glass. Initial encounter.  EXAM: RIGHT ANKLE - COMPLETE 3+ VIEW  COMPARISON:  None.  FINDINGS: The right ankle is located. No acute osseous abnormality is present. A  posterior soft tissue laceration is evident. There is gas and swelling in the soft tissues. No radiopaque foreign body is present.  IMPRESSION: 1. Posterior ankle laceration without radiopaque foreign body. 2. No acute osseous abnormality.   Electronically Signed   By: Marin Roberts M.D.   On: 07/15/2015 21:31   I, Larrie Lucia, personally reviewed and evaluated these images and lab results as part of my medical decision-making.   EKG Interpretation None      MDM   Final diagnoses:  Laceration of right ankle with tendon involvement, initial encounter    BP 131/83 mmHg  Pulse 86  Temp(Src) 98.3 F (36.8 C) (Oral)  Resp 19  Ht  (1.626 m)  Wt 145 lb (65.772 kg)  BMI 24.88 kg/m2  SpO2 96%  I have reviewed nursing notes and vital signs. I personally viewed the imaging tests through PACS system  and agrees with radiologist's intepretation I reviewed available ER/hospitalization records through the EMR     Fayrene Helper, PA-C 07/16/15 0004  Donnetta Hutching, MD 07/16/15 [phone]

## 2015-07-15 NOTE — Discharge Instructions (Signed)
You have a deep laceration to your right ankle with tendon involvement.  You will need to follow up promptly with orthopedist Dr. Roda Shutters by calling his office on Monday to schedule an appointment to have your tendon repair.  Wear Cam walker and use crutches.  Take pain medication as needed.    Laceration Care, Adult A laceration is a cut or lesion that goes through all layers of the skin and into the tissue just beneath the skin. TREATMENT  Some lacerations may not require closure. Some lacerations may not be able to be closed due to an increased risk of infection. It is important to see your caregiver as soon as possible after an injury to minimize the risk of infection and maximize the opportunity for successful closure. If closure is appropriate, pain medicines may be given, if needed. The wound will be cleaned to help prevent infection. Your caregiver will use stitches (sutures), staples, wound glue (adhesive), or skin adhesive strips to repair the laceration. These tools bring the skin edges together to allow for faster healing and a better cosmetic outcome. However, all wounds will heal with a scar. Once the wound has healed, scarring can be minimized by covering the wound with sunscreen during the day for 1 full year. HOME CARE INSTRUCTIONS  For sutures or staples:  Keep the wound clean and dry.  If you were given a bandage (dressing), you should change it at least once a day. Also, change the dressing if it becomes wet or dirty, or as directed by your caregiver.  Wash the wound with soap and water 2 times a day. Rinse the wound off with water to remove all soap. Pat the wound dry with a clean towel.  After cleaning, apply a thin layer of the antibiotic ointment as recommended by your caregiver. This will help prevent infection and keep the dressing from sticking.  You may shower as usual after the first 24 hours. Do not soak the wound in water until the sutures are removed.  Only take  over-the-counter or prescription medicines for pain, discomfort, or fever as directed by your caregiver.  Get your sutures or staples removed as directed by your caregiver. For skin adhesive strips:  Keep the wound clean and dry.  Do not get the skin adhesive strips wet. You may bathe carefully, using caution to keep the wound dry.  If the wound gets wet, pat it dry with a clean towel.  Skin adhesive strips will fall off on their own. You may trim the strips as the wound heals. Do not remove skin adhesive strips that are still stuck to the wound. They will fall off in time. For wound adhesive:  You may briefly wet your wound in the shower or bath. Do not soak or scrub the wound. Do not swim. Avoid periods of heavy perspiration until the skin adhesive has fallen off on its own. After showering or bathing, gently pat the wound dry with a clean towel.  Do not apply liquid medicine, cream medicine, or ointment medicine to your wound while the skin adhesive is in place. This may loosen the film before your wound is healed.  If a dressing is placed over the wound, be careful not to apply tape directly over the skin adhesive. This may cause the adhesive to be pulled off before the wound is healed.  Avoid prolonged exposure to sunlight or tanning lamps while the skin adhesive is in place. Exposure to ultraviolet light in the first year will  darken the scar.  The skin adhesive will usually remain in place for 5 to 10 days, then naturally fall off the skin. Do not pick at the adhesive film. You may need a tetanus shot if:  You cannot remember when you had your last tetanus shot.  You have never had a tetanus shot. If you get a tetanus shot, your arm may swell, get red, and feel warm to the touch. This is common and not a problem. If you need a tetanus shot and you choose not to have one, there is a rare chance of getting tetanus. Sickness from tetanus can be serious. SEEK MEDICAL CARE IF:   You  have redness, swelling, or increasing pain in the wound.  You see a red line that goes away from the wound.  You have yellowish-white fluid (pus) coming from the wound.  You have a fever.  You notice a bad smell coming from the wound or dressing.  Your wound breaks open before or after sutures have been removed.  You notice something coming out of the wound such as wood or glass.  Your wound is on your hand or foot and you cannot move a finger or toe. SEEK IMMEDIATE MEDICAL CARE IF:   Your pain is not controlled with prescribed medicine.  You have severe swelling around the wound causing pain and numbness or a change in color in your arm, hand, leg, or foot.  Your wound splits open and starts bleeding.  You have worsening numbness, weakness, or loss of function of any joint around or beyond the wound.  You develop painful lumps near the wound or on the skin anywhere on your body. MAKE SURE YOU:   Understand these instructions.  Will watch your condition.  Will get help right away if you are not doing well or get worse. Document Released: 11/18/2005 Document Revised: 02/10/2012 Document Reviewed: 05/14/2011 [facility] Patient Information 2015 [place], [place]. This information is not intended to replace advice given to you by your health care provider. Make sure you discuss any questions you have with your health care provider.

## 2015-07-16 ENCOUNTER — Encounter (HOSPITAL_COMMUNITY): Payer: Self-pay | Admitting: Emergency Medicine

## 2015-07-16 ENCOUNTER — Emergency Department (HOSPITAL_COMMUNITY)
Admission: EM | Admit: 2015-07-16 | Discharge: 2015-07-16 | Disposition: A | Discharge status: Home / Self Care | Payer: BLUE CROSS/BLUE SHIELD | Attending: Physician Assistant | Admitting: Physician Assistant

## 2015-07-16 DIAGNOSIS — F909 Attention-deficit hyperactivity disorder, unspecified type: Secondary | ICD-10-CM | POA: Diagnosis not present

## 2015-07-16 DIAGNOSIS — Y288XXD Contact with other sharp object, undetermined intent, subsequent encounter: Secondary | ICD-10-CM | POA: Insufficient documentation

## 2015-07-16 DIAGNOSIS — Z8719 Personal history of other diseases of the digestive system: Secondary | ICD-10-CM | POA: Insufficient documentation

## 2015-07-16 DIAGNOSIS — Z8619 Personal history of other infectious and parasitic diseases: Secondary | ICD-10-CM | POA: Insufficient documentation

## 2015-07-16 DIAGNOSIS — Z8739 Personal history of other diseases of the musculoskeletal system and connective tissue: Secondary | ICD-10-CM | POA: Insufficient documentation

## 2015-07-16 DIAGNOSIS — Z8669 Personal history of other diseases of the nervous system and sense organs: Secondary | ICD-10-CM | POA: Insufficient documentation

## 2015-07-16 DIAGNOSIS — Z79899 Other long term (current) drug therapy: Secondary | ICD-10-CM | POA: Insufficient documentation

## 2015-07-16 DIAGNOSIS — Z87891 Personal history of nicotine dependence: Secondary | ICD-10-CM | POA: Diagnosis not present

## 2015-07-16 DIAGNOSIS — M79604 Pain in right leg: Secondary | ICD-10-CM | POA: Diagnosis present

## 2015-07-16 DIAGNOSIS — S86021D Laceration of right Achilles tendon, subsequent encounter: Secondary | ICD-10-CM | POA: Insufficient documentation

## 2015-07-16 DIAGNOSIS — IMO0002 Reserved for concepts with insufficient information to code with codable children: Secondary | ICD-10-CM

## 2015-07-16 MED ORDER — ONDANSETRON HCL 4 MG/2ML IJ SOLN
4.0000 mg | Freq: Once | INTRAMUSCULAR | Status: AC
  Administered 2015-07-16: 4 mg via INTRAVENOUS
  Filled 2015-07-16: qty 2

## 2015-07-16 MED ORDER — ONDANSETRON HCL 4 MG PO TABS: 4.0000 mg | ORAL_TABLET | Freq: Three times a day (TID) | ORAL | Status: DC | PRN

## 2015-07-16 MED ORDER — FENTANYL CITRATE (PF) 100 MCG/2ML IJ SOLN
50.0000 ug | Freq: Once | INTRAMUSCULAR | Status: AC
  Administered 2015-07-16: 50 ug via INTRAVENOUS
  Filled 2015-07-16: qty 2

## 2015-07-16 MED ORDER — OXYCODONE-ACETAMINOPHEN 5-325 MG PO TABS
1.0000 | ORAL_TABLET | Freq: Once | ORAL | Status: AC
Start: 2015-07-16 — End: 2015-07-16
  Administered 2015-07-16: 1 via ORAL
  Filled 2015-07-16: qty 1

## 2015-07-16 MED ORDER — LORAZEPAM 0.5 MG PO TABS: 0.5000 mg | ORAL_TABLET | Freq: Three times a day (TID) | ORAL | Status: DC

## 2015-07-16 MED ORDER — METHOCARBAMOL 500 MG PO TABS: 500.0000 mg | ORAL_TABLET | Freq: Three times a day (TID) | ORAL | Status: DC | PRN

## 2015-07-16 NOTE — ED Notes (Signed)
Pt here yesterday for leg pain and was diagnosed with severed achilles tendon. Was told to follow up with ortho on Monday. Pt reports pain has gotten worse since yesterday even with home percocet. Pt reports numb feeling in toes, but is able to feel. Also reports nausea.

## 2015-07-16 NOTE — Progress Notes (Signed)
1:17pm. CSW consulted by MD to provide supportive counseling and resource education re: unemployment and scheduling medical appointments. CSW met with [name], [name]'s dtr ([name]) and child-age granddtr [name]). Per [name] report, she lives with her 59 year old mother in a townhouse. [name] works [employer]. [name] presents to ED with deep laceration to back of ankle (was here last night for the same right after injury occurred), awaiting surgery consult at time TBD (was given a referral for orthopedics last night to follow up Monday AM).  [name] is anxious and tearful. [name] reports she is in great pain and her current prescriptions are not managing the pain. [name] reports a variety of other concerns: her mother who she cares for is sick and has surgery consult on Tuesday, patient is worried she will not get into surgery consult early enough to keep her job, she will be fired, and and she and her mother will be homeless. CSW discussed taking things one day at a time. CSW and [name] problem-solved around how to take leave and FMLA so not to get fired. CSW provided coaching to [name] and [name]'s dtr about most expedient way to get an ortho appointment.  CSW and family discussed role that anxiety can play in patient's pain and vice versa.   After these discussions, [name] and dtr are requesting medication to get [name] through the night for anxiety, nausea and pain. CSW passed this request along to MD and RN. CSW advised family that scripts for controlled substances are unlikely in the ED; CSW advised family to follow up with PCP for any scripts that ED unable to safely provide. CSW to sign off, but available for further consult if needed.  Farina Worker [facility] phone: [phone]

## 2015-07-16 NOTE — Discharge Instructions (Signed)
Follow-up with your orthopedist as planned. We gave use the medications to help with her anxiety. Use only as indicated.

## 2015-07-16 NOTE — ED Notes (Signed)
Bed: [facility]ALD Expected date:  Expected time:  Means of arrival:  Comments: 

## 2015-07-16 NOTE — ED Notes (Signed)
Bed: WA19 Expected date:  Expected time:  Means of arrival:  Comments: 

## 2015-07-16 NOTE — ED Notes (Signed)
MD at bedside. 

## 2015-07-16 NOTE — ED Provider Notes (Signed)
CSN: [id number]     Arrival date & time 07/16/15  1044 History   First MD Initiated Contact with Patient 07/16/15 1148     Chief Complaint  Patient presents with  . Leg Pain     (Consider location/radiation/quality/duration/timing/severity/associated sxs/prior Treatment) HPI  Patient is a 59 year old female presenting after a laceration to her right Achilles tendon. Patient seen yesterday for this and was repaired and she is given ortho follow-up. Patient states that her  pain has been too bad overnight. Patient taking the pain medication but feels that since adequate. Patient is here requesting orthopedics repaired here in the emergency room. Patient is very distraught about ability to work and would like immediate treatment.   Past Medical History  Diagnosis Date  . Hepatitis C   . GERD (gastroesophageal reflux disease)   . Diverticulitis large intestine   . Anxiety   . 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 H/O hiatal hernia   . Insomnia   . Frequency   . Urination, excessive at night   . 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 ADHD (attention deficit hyperactivity disorder)    Past Surgical History  Procedure Laterality Date  . Abdominal surgery  ~ 2006    "got toothpick out of intestines"  . Hernia repair  2011  . Ureathal cyst    . Ectopic pregnancy surgery  1982    "twins"  . Leg tendon surgery  ~ 2005    left; "still have a bolt in it"  . Diagnostic laparoscopy  08/04/2012    LOA; VHR w/mesh  . Appendectomy  2011  . Cholecystectomy  2006  . Vaginal hysterectomy  1990's  . Ventral hernia repair  08/04/2012    Procedure: LAPAROSCOPIC VENTRAL HERNIA;  Surgeon: Adolph Pollack, MD;  Location: [facility] OR;  Service: General;  Laterality: N/A;  laparoscopic possible open ventral hernia repair with mesh  . Colectomy  2011    subtotal    Family History  Problem Relation Age of Onset  . Colon cancer Maternal Uncle   . Colon polyps Neg Hx   .  Prostate cancer Neg Hx   . Rectal cancer Neg Hx   . Heart disease Mother   . Heart disease Father   . Arthritis Sister    Social History  Substance Use Topics  . Smoking status: Former Smoker -- 4 years    Types: Cigarettes    Quit date: 12/02/1994  . Smokeless tobacco: Never Used     Comment: 08/04/2012 "quit smoking cigarettes 15-20 yr ago; really a recreational thing"  . Alcohol Use: 2.4 oz/week    4 Glasses of wine per week   OB History    No data available     Review of Systems  Constitutional: Negative for activity change.  Respiratory: Negative for shortness of breath.   Cardiovascular: Negative for chest pain.  Gastrointestinal: Negative for abdominal pain.  Musculoskeletal: Positive for joint swelling.      Allergies  Review of patient's allergies indicates no known allergies.  Home Medications   Prior to Admission medications   Medication Sig Start Date End Date Taking? Authorizing Provider  amphetamine-dextroamphetamine (ADDERALL XR) 10 MG 24 hr capsule Take 1 capsule (10 mg total) by mouth daily. 04/04/15   Dorna Leitz, PA-C  HYDROcodone-acetaminophen (NORCO/VICODIN) 5-325 MG per tablet Take 1 tablet by mouth every 6 (six) hours as needed for moderate pain or severe pain. 07/15/15  Fayrene Helper, PA-C  methocarbamol (ROBAXIN) 500 MG tablet Take 1 tablet (500 mg total) by mouth every 8 (eight) hours as needed for muscle spasms. 07/16/15   Fayrene Helper, PA-C  zoster vaccine live, PF, (ZOSTAVAX) [address] UNT/0.65ML injection Inject 19,400 Units into the skin once. Patient not taking: Reported on 02/28/2015 02/23/15   Lanier Clam V, PA-C   BP 139/122 mmHg  Pulse 71  Temp(Src) 97.7 F (36.5 C) (Oral)  Resp 25  SpO2 98% Physical Exam  Constitutional: She is oriented to person, place, and time. She appears well-developed and well-nourished.  HENT:  Head: Normocephalic and atraumatic.  Eyes: Right eye exhibits no discharge.  Cardiovascular: Normal rate, regular rhythm and  normal heart sounds.   No murmur heard. Pulmonary/Chest: Effort normal and breath sounds normal. She has no wheezes. She has no rales.  Abdominal: Soft. She exhibits no distension. There is no tenderness.  Neurological: She is oriented to person, place, and time.  Skin: Skin is warm and dry. She is not diaphoretic.  Well-healing laceration no erythema or drainage.  Psychiatric: She has a normal mood and affect.  Nursing note and vitals reviewed.   ED Course  Procedures (including critical care time) Labs Review Labs Reviewed - No data to display  Imaging Review Dg Ankle Complete Right  07/15/2015   CLINICAL DATA:  Posterior right ankle laceration from falling glass. Initial encounter.  EXAM: RIGHT ANKLE - COMPLETE 3+ VIEW  COMPARISON:  None.  FINDINGS: The right ankle is located. No acute osseous abnormality is present. A posterior soft tissue laceration is evident. There is gas and swelling in the soft tissues. No radiopaque foreign body is present.  IMPRESSION: 1. Posterior ankle laceration without radiopaque foreign body. 2. No acute osseous abnormality.   Electronically Signed   By: Marin Roberts M.D.   On: 07/15/2015 21:31   I, Feige Lowdermilk L Tiyonna Sardinha, personally reviewed and evaluated these images and lab results as part of my medical decision-making.   EKG Interpretation None      MDM   Final diagnoses:  None    Patient is a 59 year old female here after laceration to her Achilles tendon yesterday. Patient is very distraught on exam. She is upset because she needs to work in order to make money. She is worried about losing her home. She is requesting immediate orthopedics surgery at this time. Fortunately we cannot offer help at this time. We want her to follow up with orthopedics tomorrow. We will help her control her pain by giving her Zofran to take more pain medications at home.      Elza Varricchio Randall An, MD 07/16/15 1218

## 2015-07-20 ENCOUNTER — Telehealth: Payer: Self-pay | Admitting: Physician Assistant

## 2015-07-20 DIAGNOSIS — F902 Attention-deficit hyperactivity disorder, combined type: Secondary | ICD-10-CM

## 2015-07-20 MED ORDER — AMPHETAMINE-DEXTROAMPHET ER 10 MG PO CP24: 10.0000 mg | ORAL_CAPSULE | Freq: Every day | ORAL | Status: DC

## 2015-07-20 NOTE — Telephone Encounter (Signed)
Pt is out of adderall. Helping but she thinks she needs an increased dose. I refilled current dose and informed she should return for follow up and we will discuss options. 2 months adderall refilled and printed.

## 2015-07-21 NOTE — Telephone Encounter (Signed)
Pt in drawer. Joni Reining already spoke to pt to advise writing RFs.

## 2015-08-22 NOTE — Telephone Encounter (Signed)
Med sent.

## 2016-02-21 ENCOUNTER — Ambulatory Visit (INDEPENDENT_AMBULATORY_CARE_PROVIDER_SITE_OTHER): Payer: BLUE CROSS/BLUE SHIELD | Admitting: Physician Assistant

## 2016-02-21 VITALS — BP 140/82 | HR 82 | Temp 98.5°F | Resp 20 | Ht 66.5 in | Wt 157.8 lb

## 2016-02-21 DIAGNOSIS — Z114 Encounter for screening for human immunodeficiency virus [HIV]: Secondary | ICD-10-CM

## 2016-02-21 DIAGNOSIS — Z1321 Encounter for screening for nutritional disorder: Secondary | ICD-10-CM

## 2016-02-21 DIAGNOSIS — R030 Elevated blood-pressure reading, without diagnosis of hypertension: Secondary | ICD-10-CM | POA: Diagnosis not present

## 2016-02-21 DIAGNOSIS — F988 Other specified behavioral and emotional disorders with onset usually occurring in childhood and adolescence: Secondary | ICD-10-CM

## 2016-02-21 DIAGNOSIS — Z1239 Encounter for other screening for malignant neoplasm of breast: Secondary | ICD-10-CM | POA: Diagnosis not present

## 2016-02-21 DIAGNOSIS — Z0000 Encounter for general adult medical examination without abnormal findings: Secondary | ICD-10-CM | POA: Diagnosis not present

## 2016-02-21 DIAGNOSIS — K219 Gastro-esophageal reflux disease without esophagitis: Secondary | ICD-10-CM | POA: Diagnosis not present

## 2016-02-21 DIAGNOSIS — F909 Attention-deficit hyperactivity disorder, unspecified type: Secondary | ICD-10-CM

## 2016-02-21 DIAGNOSIS — IMO0001 Reserved for inherently not codable concepts without codable children: Secondary | ICD-10-CM

## 2016-02-21 LAB — CBC
HCT: 42.2 % (ref 36.0–46.0)
Hemoglobin: 14.4 g/dL (ref 12.0–15.0)
MCH: 29.8 pg (ref 26.0–34.0)
MCHC: 34.1 g/dL (ref 30.0–36.0)
MCV: 87.2 fL (ref 78.0–100.0)
MPV: 10.3 fL (ref 8.6–12.4)
PLATELETS: 295 10*3/uL (ref 150–400)
RBC: 4.84 MIL/uL (ref 3.87–5.11)
RDW: 13.3 % (ref 11.5–15.5)
WBC: 8.3 10*3/uL (ref 4.0–10.5)

## 2016-02-21 LAB — COMPREHENSIVE METABOLIC PANEL
ALT: 14 U/L (ref 6–29)
AST: 15 U/L (ref 10–35)
Albumin: 4.8 g/dL (ref 3.6–5.1)
Alkaline Phosphatase: 40 U/L (ref 33–130)
BUN: 20 mg/dL (ref 7–25)
CHLORIDE: 107 mmol/L (ref 98–110)
CO2: 24 mmol/L (ref 20–31)
Calcium: 9.5 mg/dL (ref 8.6–10.4)
Creat: 0.61 mg/dL (ref 0.50–1.05)
Glucose, Bld: 87 mg/dL (ref 65–99)
POTASSIUM: 4.2 mmol/L (ref 3.5–5.3)
SODIUM: 142 mmol/L (ref 135–146)
TOTAL PROTEIN: 7.3 g/dL (ref 6.1–8.1)
Total Bilirubin: 0.3 mg/dL (ref 0.2–1.2)

## 2016-02-21 MED ORDER — AMPHETAMINE-DEXTROAMPHET ER 20 MG PO CP24
20.0000 mg | ORAL_CAPSULE | Freq: Every day | ORAL | Status: DC
Start: 2016-02-21 — End: 2016-09-04

## 2016-02-21 MED ORDER — AMPHETAMINE-DEXTROAMPHET ER 20 MG PO CP24: 20.0000 mg | ORAL_CAPSULE | Freq: Every day | ORAL | Status: DC

## 2016-02-21 MED ORDER — OMEPRAZOLE 20 MG PO CPDR
20.0000 mg | DELAYED_RELEASE_CAPSULE | Freq: Every day | ORAL | Status: DC
Start: 2016-02-21 — End: 2016-08-04

## 2016-02-21 NOTE — Progress Notes (Signed)
[facility] [address], [place] [address] [phone]  Date:  02/21/2016   Name:  [name]   DOB:  07/14/1956   MRN:  [id number]  PCP:  Dorna Leitz, PA-C    Chief Complaint: Annual Exam   History of Present Illness:  This is a 60 y.o. female with PMH hep C in remission, ADHD, hx esophageal stricture, hx diverticulosis who is presenting for CPE.  Pt was treated for hep C 9 years ago with interferon. Thinks she got hep C from transfusion in 1982. She has had several GI problems from the interferon. She states "the interferon attacked my colon". She reports she had ulcers in her colon which eventually ruptured and she had to have an almost total colectomy. She is occasionally incontinent d/t this but is overall doing very well. She sees GI on a regular basis. She also sees GI for recurrent esophageal strictures. She has had multiple dilations before. She states if she is is lucky a dilation will leave her symptom free for max 1 year before symptoms return. She last had endoscopy and dilation 1 year ago. She can feel that she is starting to get symptoms again but states she doesn't want to get anymore endoscopies/dilations. Had colonoscopy 1 year ago as well - clear. Doesn't need another for 5 years. Checked Hep C RNA quant 1 year ago and negative.  She is having some trouble with acid reflux. Not currently taking anything.   1 year ago started on adderall 10 mg XR daily for ADHD. This has helped her tremendously with focus. She has actually gained 10 pounds d/t being focused enough to sit down and eat. She is sleeping much better and anxiety has decreased. She ran out 1 week ago and states this week "has been horrible". Her mother begged her to come in and get her meds. She is wondering about getting an increased dose. She takes her meds around 5 am and starts to wear off around 10 AM. It used to wear off around noon. She denies palps, cp, sob.  Pt had an  right achilles rupture 07/2015 from an injury. Had to have surgical repair. Did PT. She is recovering well.  At last CPE 1 year ago, TSH, CBC, lipid panel and CMP normal.  Last pap: 04/2014 Immunizations: flu 09/2015, tdap 01/2015 Dentist: 2 weeks ago Eye: no change in vision. Last went to eye doctor 1 month ago. Diet/Exercise: eats well. Exercises regularly. Tobacco/alcohol/substance use: no/no/no Mammogram: 03/2014 Colonoscopy: 01/2015 Pt works as Engineer, materials.   Review of Systems:  Review of Systems See HPI  Patient Active Problem List   Diagnosis Date Noted  . History of hepatitis C 02/26/2015  . H/O colectomy 02/26/2015  . Stricture and stenosis of esophagus 05/22/2012  . Recurrent ventral incisional hernia s/p lap repair with mesh 9/3. 05/20/2012  . ADD 02/28/2010  . ABDOMINAL PAIN-MULTIPLE SITES 02/28/2010  . DIVERTICULITIS, HX OF 02/28/2010  . OTHER ANXIETY STATES 10/02/2009  . HEMORRHAGE OF RECTUM AND ANUS 10/02/2009    Prior to Admission medications   Medication Sig Start Date End Date Taking? Authorizing Provider  amphetamine-dextroamphetamine (ADDERALL XR) 10 MG 24 hr capsule Take 1 capsule (10 mg total) by mouth daily. 07/20/15  Yes Lanier Clam V, PA-C  amphetamine-dextroamphetamine (ADDERALL XR) 10 MG 24 hr capsule Take 1 capsule (10 mg total) by mouth daily. May fill 30 days after written 07/20/15  Yes Dorna Leitz, PA-C  No Known Allergies  Past Surgical History  Procedure Laterality Date  . Abdominal surgery  ~ 2006    "got toothpick out of intestines"  . Hernia repair  2011  . Ureathal cyst    . Ectopic pregnancy surgery  1982    "twins"  . Leg tendon surgery  ~ 2005    left; "still have a bolt in it"  . Diagnostic laparoscopy  08/04/2012    LOA; VHR w/mesh  . Appendectomy  2011  . Cholecystectomy  2006  . Vaginal hysterectomy  1990's  . Ventral hernia repair  08/04/2012    Procedure: LAPAROSCOPIC VENTRAL HERNIA;  Surgeon: Adolph Pollack, MD;   Location: [facility] OR;  Service: General;  Laterality: N/A;  laparoscopic possible open ventral hernia repair with mesh  . Colectomy  2011    subtotal     Social History  Substance Use Topics  . Smoking status: Former Smoker -- 4 years    Types: Cigarettes    Quit date: 12/02/1994  . Smokeless tobacco: Never Used     Comment: 08/04/2012 "quit smoking cigarettes 15-20 yr ago; really a recreational thing"  . Alcohol Use: 2.4 oz/week    4 Glasses of wine per week    Family History  Problem Relation Age of Onset  . Colon cancer Maternal Uncle   . Colon polyps Neg Hx   . Prostate cancer Neg Hx   . Rectal cancer Neg Hx   . Heart disease Mother   . Heart disease Father   . Arthritis Sister     Medication list has been reviewed and updated.  Physical Examination:  Physical Exam  Constitutional: She is oriented to person, place, and time.  HENT:  Head: Normocephalic and atraumatic.  Right Ear: Hearing, tympanic membrane, external ear and ear canal normal.  Left Ear: Hearing, tympanic membrane, external ear and ear canal normal.  Nose: Nose normal.  Mouth/Throat: Uvula is midline, oropharynx is clear and moist and mucous membranes are normal.  Eyes: Conjunctivae, EOM and lids are normal. Pupils are equal, round, and reactive to light. Right eye exhibits no discharge. Left eye exhibits no discharge. No scleral icterus.  Neck: Trachea normal. Carotid bruit is not present. No thyromegaly present.  Cardiovascular: Normal rate, regular rhythm, normal heart sounds, intact distal pulses and normal pulses.   No murmur heard. Pulmonary/Chest: Effort normal and breath sounds normal. She has no wheezes. She has no rhonchi. She has no rales.  Declined breast exam  Abdominal: Soft. Normal appearance. There is no tenderness.  Musculoskeletal: Normal range of motion.  Lymphadenopathy:       Head (right side): No submental, no submandibular, no tonsillar, no preauricular and no posterior auricular  adenopathy present.       Head (left side): No submental, no submandibular, no tonsillar, no preauricular and no posterior auricular adenopathy present.    She has no cervical adenopathy.       Right: No supraclavicular adenopathy present.       Left: No supraclavicular adenopathy present.  Neurological: She is alert and oriented to person, place, and time. She has normal strength and normal reflexes. No cranial nerve deficit or sensory deficit. Coordination and gait normal.  Skin: Skin is warm, dry and intact. No lesion and no rash noted.  Psychiatric: She has a normal mood and affect. Her speech is normal and behavior is normal. Thought content normal.   BP 140/82 mmHg  Pulse 82  Temp(Src) 98.5 F (36.9 C) (Oral)  Resp 20  Ht 5' 6.5" (1.689 m)  Wt 157 lb 12.8 oz (71.578 kg)  BMI 25.09 kg/m2  SpO2 94%   Visual Acuity Screening   Right eye Left eye Both eyes  Without correction:     With correction: 20/25 20/25 20/25    Assessment and Plan:  1. Annual physical exam Preventative screening up to date except for mammogram. Advised pt to schedule soon. - CBC  2. Screening for HIV (human immunodeficiency virus) - HIV antibody  3. Encounter for vitamin deficiency screening - Vitamin D 1,25 dihydroxy  4. Gastroesophageal reflux disease, esophagitis presence not specified Start prilosec daily as needed for heartburn. Discussed diet and lifestyle changes as well. - Comprehensive metabolic panel - omeprazole (PRILOSEC) 20 MG capsule; Take 1 capsule (20 mg total) by mouth daily.  Dispense: 30 capsule; Refill: 3  5. ADD (attention deficit disorder) adderall 10 mg XR was wearing off too early in the day. Increase to 20 mg XR. Return in 3 months for follow up. - amphetamine-dextroamphetamine (ADDERALL XR) 20 MG 24 hr capsule; Take 1 capsule (20 mg total) by mouth daily.  Dispense: 30 capsule; Refill: 0 - amphetamine-dextroamphetamine (ADDERALL XR) 20 MG 24 hr capsule; Take 1 capsule (20  mg total) by mouth daily.  Dispense: 30 capsule; Refill: 0 - amphetamine-dextroamphetamine (ADDERALL XR) 20 MG 24 hr capsule; Take 1 capsule (20 mg total) by mouth daily.  Dispense: 30 capsule; Refill: 0  6. Elevated BP BP a little elevated today - looking back this is the only reading that has been elevated. First check 160/75 -- recheck 140/82. She can check her BP at work. Advise she check a few times a week and return if consistently >140/90.  7. Breast cancer screening Make appt for mammogram.   Roswell MinersNicole V. Dyke BrackettBush, PA-C, MHS [facility]  03/02/2016

## 2016-02-21 NOTE — Patient Instructions (Addendum)
Monitor your blood pressure. If consistently >140/90, return to discuss. I will call you with your lab results. Start adderall 20 mg daily. Start prilosec daily for acid reflux. Providers here that you may like - Dr. Nilda SimmerKristi Smith, PA Porfirio Oarhelle Jeffery, [place]PA Benny LennertSarah Weber, PA Trena PlattStephanie English    IF you received an x-ray today, you will receive an invoice from [facility]. Please contact [facility] at [phone] with questions or concerns regarding your invoice.   IF you received labwork today, you will receive an invoice from [facility]. Please contact [facility] at [phone] with questions or concerns regarding your invoice.   Our billing staff will not be able to assist you with questions regarding bills from these companies.  You will be contacted with the lab results as soon as they are available. The fastest way to get your results is to activate your My Chart account. Instructions are located on the last page of this paperwork. If you have not heard from us regarding the results in 2 weeks, please contact this office.

## 2016-02-22 LAB — HIV ANTIBODY (ROUTINE TESTING W REFLEX): HIV: NONREACTIVE

## 2016-02-24 LAB — VITAMIN D 1,25 DIHYDROXY
VITAMIN D 1, 25 (OH) TOTAL: 85 pg/mL — AB (ref 18–72)
VITAMIN D3 1, 25 (OH): 85 pg/mL
Vitamin D2 1, 25 (OH)2: <8 pg/mL

## 2016-03-02 DIAGNOSIS — K219 Gastro-esophageal reflux disease without esophagitis: Secondary | ICD-10-CM | POA: Insufficient documentation

## 2016-05-13 ENCOUNTER — Ambulatory Visit (INDEPENDENT_AMBULATORY_CARE_PROVIDER_SITE_OTHER): Payer: BLUE CROSS/BLUE SHIELD | Admitting: Family Medicine

## 2016-05-13 VITALS — BP 142/82 | HR 69 | Temp 97.8°F | Resp 18 | Ht 66.5 in | Wt 157.8 lb

## 2016-05-13 DIAGNOSIS — F988 Other specified behavioral and emotional disorders with onset usually occurring in childhood and adolescence: Secondary | ICD-10-CM

## 2016-05-13 DIAGNOSIS — F909 Attention-deficit hyperactivity disorder, unspecified type: Secondary | ICD-10-CM

## 2016-05-13 MED ORDER — AMPHETAMINE-DEXTROAMPHET ER 20 MG PO CP24: 20.0000 mg | ORAL_CAPSULE | Freq: Every day | ORAL | Status: DC

## 2016-05-13 NOTE — Patient Instructions (Addendum)
Referral is being made to the ADHD management practice, [facility] [facility].  Continue using your Adderall. If you do not need it when you're not working, try to do without.  Feel free to return here on an as-needed basis, however we plan to not be the regular prescribers of your Adderall because this is a tightly controlled substances and should be managed by one person consistently.  Always keep your medicine securely, because lost or stolen prescription will not be refilled unless a official police statement is given us.      IF you received an x-ray today, you will receive an invoice from [facility]. Please contact [facility] at [phone] with questions or concerns regarding your invoice.   IF you received labwork today, you will receive an invoice from [facility]. Please contact [facility] at [phone] with questions or concerns regarding your invoice.   Our billing staff will not be able to assist you with questions regarding bills from these companies.  You will be contacted with the lab results as soon as they are available. The fastest way to get your results is to activate your My Chart account. Instructions are located on the last page of this paperwork. If you have not heard from us regarding the results in 2 weeks, please contact this office.    We recommend that you schedule a mammogram for breast cancer screening. Typically, you do not need a referral to do this. Please contact a local imaging center to schedule your mammogram.  [facility] - [phone]  *ask for the Radiology Department [facility] - [phone] or [phone]  [facility] - [phone] [facility] - [phone]) [phone] [facility] - [phone]  *ask for the Radiology Department [facility] - [phone]  *ask for the Radiology Department [facility] - [phone]  *ask for the Mammography Department [facility] - [phone]

## 2016-05-13 NOTE — Progress Notes (Signed)
Patient ID: Huntley Dec, female    DOB: July 09, 1956  Age: 60 y.o. MRN: [id number]  Chief Complaint  Patient presents with  . Medication Refill    ADDERALL    Subjective:   60 year old lady who has a history of ADHD, and has been on Adderall for a long time. She had been seeing PA Lanier Clam who is no longer here. Is doing satisfactorily on her medicines. Denies overusing them.  Current allergies, medications, problem list, past/family and social histories reviewed.  Objective:  BP 142/82 mmHg  Pulse 69  Temp(Src) 97.8 F (36.6 C) (Oral)  Resp 18  Ht 5' 6.5" (1.689 m)  Wt 157 lb 12.8 oz (71.578 kg)  BMI 25.09 kg/m2  SpO2 98%  No major acute distress. Does talk rapidly.  Assessment & Plan:   Assessment: 1. ADD (attention deficit disorder)       Plan: Will represcribed her medications for 60 days and make a referral to the ADHD management practice. Return here as needed.  Orders Placed This Encounter  Procedures  . Ambulatory referral to Psychiatry    Referral Priority:  Routine    Referral Type:  Psychiatric    Referral Reason:  Specialty Services Required    Requested Specialty:  Psychiatry    Number of Visits Requested:  1    Meds ordered this encounter  Medications  . amphetamine-dextroamphetamine (ADDERALL XR) 20 MG 24 hr capsule    Sig: Take 1 capsule (20 mg total) by mouth daily.    Dispense:  30 capsule    Refill:  0    May fill 30 days after signed.  Marland Kitchen amphetamine-dextroamphetamine (ADDERALL XR) 20 MG 24 hr capsule    Sig: Take 1 capsule (20 mg total) by mouth daily.    Dispense:  30 capsule    Refill:  0         Patient Instructions   Referral is being made to the ADHD management practice, [place] [facility].  Continue using your Adderall. If you do not need it when you're not working, try to do without.  Feel free to return here on an as-needed basis, however we plan to not be the regular prescribers of your Adderall  because this is a tightly controlled substances and should be managed by one person consistently.  Always keep your medicine securely, because lost or stolen prescription will not be refilled unless a official police statement is given [place].      IF you received an x-ray today, you will receive an invoice from [facility]. Please contact [facility] at [phone] with questions or concerns regarding your invoice.   IF you received labwork today, you will receive an invoice from [facility]. Please contact [facility] at [phone] with questions or concerns regarding your invoice.   Our billing staff will not be able to assist you with questions regarding bills from these companies.  You will be contacted with the lab results as soon as they are available. The fastest way to get your results is to activate your My Chart account. Instructions are located on the last page of this paperwork. If you have not heard from [place] regarding the results in 2 weeks, please contact this office.    We recommend that you schedule a mammogram for breast cancer screening. Typically, you do not need a referral to do this. Please contact a local imaging center to schedule your mammogram.  [facility] - [phone]  *ask for  the Radiology Department [facility] - [phone] or [phone]  [facility] - [phone] [facility] - [phone] [facility] - [phone]  *ask for the Radiology Department [facility] - [phone]  *ask for the Radiology Department [facility] - [phone]  *ask for the Mammography Department [facility] - [phone]     Return if symptoms worsen or fail to improve.   Tavien Chestnut, MD 05/13/2016

## 2016-07-09 ENCOUNTER — Encounter: Payer: Self-pay | Admitting: Physician Assistant

## 2016-08-04 ENCOUNTER — Other Ambulatory Visit: Payer: Self-pay | Admitting: Family Medicine

## 2016-08-04 DIAGNOSIS — K219 Gastro-esophageal reflux disease without esophagitis: Secondary | ICD-10-CM

## 2016-08-16 ENCOUNTER — Other Ambulatory Visit: Payer: Self-pay

## 2016-08-16 DIAGNOSIS — K219 Gastro-esophageal reflux disease without esophagitis: Secondary | ICD-10-CM

## 2016-08-16 MED ORDER — OMEPRAZOLE 20 MG PO CPDR: 20.0000 mg | DELAYED_RELEASE_CAPSULE | Freq: Every day | ORAL | 0 refills | Status: DC

## 2016-09-02 ENCOUNTER — Emergency Department (HOSPITAL_COMMUNITY): Payer: BLUE CROSS/BLUE SHIELD

## 2016-09-02 ENCOUNTER — Ambulatory Visit (INDEPENDENT_AMBULATORY_CARE_PROVIDER_SITE_OTHER): Payer: BLUE CROSS/BLUE SHIELD

## 2016-09-02 ENCOUNTER — Emergency Department (HOSPITAL_COMMUNITY)
Admission: EM | Admit: 2016-09-02 | Discharge: 2016-09-03 | Disposition: A | Discharge status: Home / Self Care | Payer: BLUE CROSS/BLUE SHIELD | Attending: Emergency Medicine | Admitting: Emergency Medicine

## 2016-09-02 ENCOUNTER — Encounter (HOSPITAL_COMMUNITY): Payer: Self-pay | Admitting: Emergency Medicine

## 2016-09-02 ENCOUNTER — Ambulatory Visit (INDEPENDENT_AMBULATORY_CARE_PROVIDER_SITE_OTHER): Payer: BLUE CROSS/BLUE SHIELD | Admitting: Family Medicine

## 2016-09-02 ENCOUNTER — Telehealth: Payer: Self-pay

## 2016-09-02 VITALS — BP 122/72 | HR 77 | Temp 97.7°F | Resp 17 | Ht 64.5 in | Wt 157.0 lb

## 2016-09-02 DIAGNOSIS — R0602 Shortness of breath: Secondary | ICD-10-CM | POA: Diagnosis not present

## 2016-09-02 DIAGNOSIS — Z87891 Personal history of nicotine dependence: Secondary | ICD-10-CM | POA: Insufficient documentation

## 2016-09-02 DIAGNOSIS — F909 Attention-deficit hyperactivity disorder, unspecified type: Secondary | ICD-10-CM | POA: Insufficient documentation

## 2016-09-02 DIAGNOSIS — L02229 Furuncle of trunk, unspecified: Secondary | ICD-10-CM

## 2016-09-02 DIAGNOSIS — R9431 Abnormal electrocardiogram [ECG] [EKG]: Secondary | ICD-10-CM

## 2016-09-02 DIAGNOSIS — L0233 Carbuncle of buttock: Secondary | ICD-10-CM

## 2016-09-02 DIAGNOSIS — R079 Chest pain, unspecified: Secondary | ICD-10-CM

## 2016-09-02 DIAGNOSIS — R3915 Urgency of urination: Secondary | ICD-10-CM

## 2016-09-02 LAB — BASIC METABOLIC PANEL
Anion gap: 9 (ref 5–15)
BUN: 8 mg/dL (ref 6–20)
CALCIUM: 9.1 mg/dL (ref 8.9–10.3)
CHLORIDE: 108 mmol/L (ref 101–111)
CO2: 21 mmol/L — AB (ref 22–32)
CREATININE: 0.69 mg/dL (ref 0.44–1.00)
GFR calc Af Amer: >60 mL/min (ref >60)
GFR calc non Af Amer: >60 mL/min (ref >60)
GLUCOSE: 98 mg/dL (ref 65–99)
Potassium: 4 mmol/L (ref 3.5–5.1)
Sodium: 138 mmol/L (ref 135–145)

## 2016-09-02 LAB — POCT CBC
GRANULOCYTE PERCENT: 79.5 % (ref 37–80)
HCT, POC: 38 % (ref 37.7–47.9)
HEMOGLOBIN: 13.9 g/dL (ref 12.2–16.2)
Lymph, poc: 1.7 (ref 0.6–3.4)
MCH: 30.6 pg (ref 27–31.2)
MCHC: 36.5 g/dL — AB (ref 31.8–35.4)
MCV: 83.8 fL (ref 80–97)
MID (CBC): 0.4 (ref 0–0.9)
MPV: 7.8 fL (ref 0–99.8)
POC Granulocyte: 8.4 — AB (ref 2–6.9)
POC LYMPH PERCENT: 16.4 %L (ref 10–50)
POC MID %: 4.1 % (ref 0–12)
Platelet Count, POC: 213 10*3/uL (ref 142–424)
RBC: 4.54 M/uL (ref 4.04–5.48)
RDW, POC: 12.5 %
WBC: 10.6 10*3/uL — AB (ref 4.6–10.2)

## 2016-09-02 LAB — POCT URINALYSIS DIP (MANUAL ENTRY)
Bilirubin, UA: NEGATIVE
GLUCOSE UA: NEGATIVE
Ketones, POC UA: NEGATIVE
Leukocytes, UA: NEGATIVE
NITRITE UA: NEGATIVE
Protein Ur, POC: NEGATIVE
Spec Grav, UA: 1.015
Urobilinogen, UA: 0.2
pH, UA: 5.5

## 2016-09-02 LAB — CBC
HCT: 42.4 % (ref 36.0–46.0)
Hemoglobin: 14 g/dL (ref 12.0–15.0)
MCH: 29.7 pg (ref 26.0–34.0)
MCHC: 33 g/dL (ref 30.0–36.0)
MCV: 89.8 fL (ref 78.0–100.0)
PLATELETS: 223 10*3/uL (ref 150–400)
RBC: 4.72 MIL/uL (ref 3.87–5.11)
RDW: 12.6 % (ref 11.5–15.5)
WBC: 8.4 10*3/uL (ref 4.0–10.5)

## 2016-09-02 LAB — COMPREHENSIVE METABOLIC PANEL
ALBUMIN: 4.5 g/dL (ref 3.6–5.1)
ALT: 14 U/L (ref 6–29)
AST: 17 U/L (ref 10–35)
Alkaline Phosphatase: 40 U/L (ref 33–130)
BILIRUBIN TOTAL: 0.6 mg/dL (ref 0.2–1.2)
BUN: 13 mg/dL (ref 7–25)
CO2: 24 mmol/L (ref 20–31)
CREATININE: 0.69 mg/dL (ref 0.50–0.99)
Calcium: 9.3 mg/dL (ref 8.6–10.4)
Chloride: 105 mmol/L (ref 98–110)
Glucose, Bld: 92 mg/dL (ref 65–99)
Potassium: 4.6 mmol/L (ref 3.5–5.3)
SODIUM: 138 mmol/L (ref 135–146)
TOTAL PROTEIN: 7.1 g/dL (ref 6.1–8.1)

## 2016-09-02 LAB — I-STAT TROPONIN, ED: TROPONIN I, POC: 0.01 ng/mL (ref 0.00–0.08)

## 2016-09-02 MED ORDER — ONDANSETRON 4 MG PO TBDP
ORAL_TABLET | ORAL | Status: AC
  Filled 2016-09-02: qty 1

## 2016-09-02 MED ORDER — OXYCODONE-ACETAMINOPHEN 5-325 MG PO TABS
ORAL_TABLET | ORAL | Status: AC
  Filled 2016-09-02: qty 1

## 2016-09-02 MED ORDER — DOXYCYCLINE HYCLATE 100 MG PO CAPS: 100.0000 mg | ORAL_CAPSULE | Freq: Two times a day (BID) | ORAL | 0 refills | Status: DC

## 2016-09-02 MED ORDER — ONDANSETRON 4 MG PO TBDP
4.0000 mg | ORAL_TABLET | Freq: Once | ORAL | Status: AC | PRN
  Administered 2016-09-02: 4 mg via ORAL

## 2016-09-02 MED ORDER — IOPAMIDOL (ISOVUE-370) INJECTION 76%
INTRAVENOUS | Status: AC
  Administered 2016-09-02: 60 mL
  Filled 2016-09-02: qty 100

## 2016-09-02 MED ORDER — OXYCODONE-ACETAMINOPHEN 5-325 MG PO TABS
1.0000 | ORAL_TABLET | ORAL | Status: DC | PRN
  Administered 2016-09-02: 1 via ORAL

## 2016-09-02 NOTE — ED Notes (Signed)
To CT at this time.

## 2016-09-02 NOTE — Patient Instructions (Addendum)
PRESENT IMMEDIATELY TO THE EMERGENCY ROOM FOR FURTHER EVALUATION.   Follow up in 2 days for packing removal  IF you received an x-ray today, you will receive an invoice from [facility]. Please contact [facility] at [phone] with questions or concerns regarding your invoice.   IF you received labwork today, you will receive an invoice from [facility]. Please contact [facility] at [phone] with questions or concerns regarding your invoice.   Our billing staff will not be able to assist you with questions regarding bills from these companies.  You will be contacted with the lab results as soon as they are available. The fastest way to get your results is to activate your My Chart account. Instructions are located on the last page of this paperwork. If you have not heard from us regarding the results in 2 weeks, please contact this office.     Incision and Drainage, Care After Refer to this sheet in the next few weeks. These instructions provide you with information on caring for yourself after your procedure. Your caregiver may also give you more specific instructions. Your treatment has been planned according to current medical practices, but problems sometimes occur. Call your caregiver if you have any problems or questions after your procedure. HOME CARE INSTRUCTIONS   If antibiotic medicine is given, take it as directed. Finish it even if you start to feel better.  Only take over-the-counter or prescription medicines for pain, discomfort, or fever as directed by your caregiver.  Keep all follow-up appointments as directed by your caregiver.  Change any bandages (dressings) as directed by your caregiver. Replace old dressings with clean dressings.  Wash your hands before and after caring for your wound. You will receive specific instructions for cleansing and caring for your wound.  SEEK MEDICAL CARE IF:   You have increased pain,  swelling, or redness around the wound.  You have increased drainage, smell, or bleeding from the wound.  You have muscle aches, chills, or you feel generally sick.  You have a fever. MAKE SURE YOU:   Understand these instructions.  Will watch your condition.  Will get help right away if you are not doing well or get worse.   This information is not intended to replace advice given to you by your health care provider. Make sure you discuss any questions you have with your health care provider.   Document Released: 02/10/2012 Document Revised: 12/09/2014 Document Reviewed: 02/10/2012 Elsevier Interactive Patient Education [employer]2016 Elsevier Inc.

## 2016-09-02 NOTE — ED Notes (Signed)
Pt returned from CT °

## 2016-09-02 NOTE — ED Triage Notes (Signed)
Pt presents to ed after being sent from PCP after having a "abnormal EKG". Pt was there to have a Boil lanced. Pt was also having left arm pain for the last 3 days with pressure in her chest.

## 2016-09-02 NOTE — Progress Notes (Signed)
Subjective:    Patient ID: [name], female    DOB: 09/27/1956, 60 y.o.   MRN: [id number]  09/02/2016  Shortness of Breath and boil on butt   HPI This 60 y.o. female presents for evaluation of shortness of breath and skin infection.  Developed a boil on buttocks; also developed sore on R nare with swollen R are; developed sores in nose.  Then developed boil on buttocks. Then got sick with nausea and shortness of breath.  Has been redecorating a room; would have to stop to rest due to SOB.  Very active so this is unusual.  Non-smoking.  Body aches and back ache with chills and sweats.  Onset of all symptoms three days ago.  Has been taking epsom salt baths. Applied drawing cream.  Also had remaining Cipro and Flagyl today and yesterday.  Has not checked temperature; felt clammy coolness.  Mild rhinorrhea; no nasal congestion.  No sore throat.  No ear pain.  No coughing.  No orthopnea.  Has been off of Adderall for a while.  Usually fast paced individual.  No swelling in legs.  No energy today.  Feels really funny. No vomiting; no diarrhea.   +abdominal pain that radiates to back.  +urgency and frequency yet hesitancy; +HA.   No chest pain.  No palpitations.  Had sharp L arm pains last week that have resolved.  Has not felt myself for the past two weeks.  L axillary pain last week; radiated into hand.  Woke up with pain last week.  Walking with daughter, kept grabbing arm; elevating arm would make pain worse; pain was sporadic; it would hit hard and then calm down.  Has been going to bariatric clinic; has been receiving HCG injections; last visit was last week.  Worried about blood clot; has been getting leg cramps.   Review of Systems  Constitutional: Positive for chills, diaphoresis, fatigue and fever.  HENT: Negative for congestion, ear pain, postnasal drip, rhinorrhea, sinus pressure, sore throat and trouble swallowing.   Eyes: Negative for visual disturbance.  Respiratory: Positive for  shortness of breath. Negative for cough and wheezing.   Cardiovascular: Negative for chest pain, palpitations and leg swelling.  Gastrointestinal: Positive for constipation and diarrhea. Negative for abdominal pain, nausea and vomiting.  Endocrine: Negative for cold intolerance, heat intolerance, polydipsia, polyphagia and polyuria.  Genitourinary: Positive for frequency and urgency. Negative for dysuria, hematuria, vaginal bleeding, vaginal discharge and vaginal pain.  Skin: Positive for color change and wound.  Neurological: Positive for headaches. Negative for dizziness, tremors, seizures, syncope, facial asymmetry, speech difficulty, weakness, light-headedness and numbness.    Past Medical History:  Diagnosis Date  . ADHD (attention deficit hyperactivity disorder)   . Anxiety   . Arthritis    "in my neck"  . Diverticulitis large intestine   . Frequency   . GERD (gastroesophageal reflux disease)   . H/O hiatal hernia   . Hepatitis C   . Insomnia   . 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 Urination, excessive at night    Past Surgical History:  Procedure Laterality Date  . ABDOMINAL SURGERY  ~ 2006   "got toothpick out of intestines"  . APPENDECTOMY  2011  . CHOLECYSTECTOMY  2006  . COLECTOMY  2011   subtotal   . DIAGNOSTIC LAPAROSCOPY  08/04/2012   LOA; VHR w/mesh  . ECTOPIC PREGNANCY SURGERY  1982   "twins"  . HERNIA REPAIR  2011  .  LEG TENDON SURGERY  ~ 2005   left; "still have a bolt in it"  . ureathal cyst    . VAGINAL HYSTERECTOMY  1990's  . VENTRAL HERNIA REPAIR  08/04/2012   Procedure: LAPAROSCOPIC VENTRAL HERNIA;  Surgeon: Adolph Pollackodd J Rosenbower, MD;  [facility];  Service: General;  Laterality: N/A;  laparoscopic possible open ventral hernia repair with mesh   No Known Allergies  Social History   Social History  . Marital status: Divorced    Spouse name: N/A  . Number of children: 2  . Years of education: N/A    Occupational History  . 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 Smoking status: Former Smoker    Years: 4.00    Types: Cigarettes    Quit date: 12/02/1994  . Smokeless tobacco: Never Used     Comment: 08/04/2012 "quit smoking cigarettes 15-20 yr ago; really a recreational thing"  . Alcohol use 2.4 oz/week    4 Glasses of wine per week  . Drug use: No  . Sexual activity: Yes   Other Topics Concern  . Not on file   Social History Narrative  . No narrative on file   Family History  Problem Relation Age of Onset  . Colon cancer Maternal Uncle   . Colon polyps Neg Hx   . Prostate cancer Neg Hx   . Rectal cancer Neg Hx   . Heart disease Mother   . Heart disease Father   . Arthritis Sister        Objective:    BP 122/72 (BP Location: Right Arm, Patient Position: Sitting, Cuff Size: Normal)   Pulse 77   Temp 97.7 F (36.5 C) (Oral)   Resp 17   Ht 5' 4.5" (1.638 m)   Wt 157 lb (71.2 kg)   SpO2 99%   BMI 26.53 kg/m  Physical Exam  Constitutional: She is oriented to person, place, and time. She appears well-developed and well-nourished. No distress.  HENT:  Head: Normocephalic and atraumatic.  Right Ear: External ear normal.  Left Ear: External ear normal.  Nose: Nose normal.  Mouth/Throat: Oropharynx is clear and moist.  Eyes: Conjunctivae are normal. Pupils are equal, round, and reactive to light.  Neck: Normal range of motion. Neck supple. No JVD present. No tracheal deviation present. No thyromegaly present.  Cardiovascular: Normal rate, regular rhythm, normal heart sounds and intact distal pulses.  Exam reveals no gallop and no friction rub.   No murmur heard. Pulmonary/Chest: Breath sounds normal. No accessory muscle usage or stridor. She is in respiratory distress. She has no decreased breath sounds. She has no wheezes. She has no rhonchi. She has no rales.  Mild respiratory distress during visit; RR 22.  Abdominal: Soft. Bowel  sounds are normal. She exhibits no distension and no mass. There is no tenderness. There is no rebound and no guarding.  Well healed midline scar.  Lymphadenopathy:    She has no cervical adenopathy.  Neurological: She is alert and oriented to person, place, and time.  Skin: She is not diaphoretic. There is erythema.     2 cm area of induration, erythema, tenderness along R buttocks/hip region; mild fluctuance present.  Psychiatric: She has a normal mood and affect. Her behavior is normal.  Nursing note and vitals reviewed.  Results for orders placed or performed in visit on 09/02/16  POCT CBC  Result Value Ref Range   WBC 10.6 (A) 4.6 - 10.2  K/uL   Lymph, poc 1.7 0.6 - 3.4   POC LYMPH PERCENT 16.4 10 - 50 %L   MID (cbc) 0.4 0 - 0.9   POC MID % 4.1 0 - 12 %M   POC Granulocyte 8.4 (A) 2 - 6.9   Granulocyte percent 79.5 37 - 80 %G   RBC 4.54 4.04 - 5.48 M/uL   Hemoglobin 13.9 12.2 - 16.2 g/dL   HCT, POC 40.9 81.1 - 47.9 %   MCV 83.8 80 - 97 fL   MCH, POC 30.6 27 - 31.2 pg   MCHC 36.5 (A) 31.8 - 35.4 g/dL   RDW, POC 91.4 %   Platelet Count, POC 213 142 - 424 K/uL   MPV 7.8 0 - 99.8 fL  POCT urinalysis dipstick  Result Value Ref Range   Color, UA yellow yellow   Clarity, UA clear clear   Glucose, UA negative negative   Bilirubin, UA negative negative   Ketones, POC UA negative negative   Spec Grav, UA 1.015    Blood, UA trace-intact (A) negative   pH, UA 5.5    Protein Ur, POC negative negative   Urobilinogen, UA 0.2    Nitrite, UA Negative Negative   Leukocytes, UA Negative Negative   Dg Chest 2 View  Result Date: 09/02/2016 CLINICAL DATA:  Shortness of breath. EXAM: CHEST  2 VIEW COMPARISON:  10/21/2011. FINDINGS: Mediastinum hilar structures normal. Mild left base subsegmental atelectasis. Heart size normal. No acute bony abnormality. Prior abdominal wall hernia repair. IMPRESSION: Mild left base subsegmental atelectasis. Electronically Signed   By: Maisie Fus  Register    On: 09/02/2016 10:45   EKG: atrial rhythm; T wave inversion; concern for inferior ischemia; +changes from 2005 EKG in EMR.    Assessment & Plan:   1. Shortness of breath   2. Nonspecific abnormal electrocardiogram (ECG) (EKG)   3. Carbuncle and furuncle of trunk   4. Urinary urgency    -new SOB with abnormal EKG; has been receiving HCG injections for weight loss; to ED for CT angio and cardiac enzymes.   -s/p I&D of carbuncle of buttocks; rx for Doxycycline provided; wound culture obtained; RTC 2 days for wound check. -send urine culture due to urinary symptoms; has been taking Cipro and Flagyl at home.   Orders Placed This Encounter  Procedures  . Urine culture  . DG Chest 2 View    Standing Status:   Future    Number of Occurrences:   1    Standing Expiration Date:   09/02/2017    Order Specific Question:   Reason for Exam (SYMPTOM  OR DIAGNOSIS REQUIRED)    Answer:   shortness of breath, fever, boil, nasal ulcerations    Order Specific Question:   Is the patient pregnant?    Answer:   No    Comments:   hysterectomy    Order Specific Question:   Preferred imaging location?    Answer:   External  . Comprehensive metabolic panel  . POCT CBC  . POCT urinalysis dipstick  . EKG 12-Lead   Meds ordered this encounter  Medications  . doxycycline (VIBRAMYCIN) 100 MG capsule    Sig: Take 1 capsule (100 mg total) by mouth 2 (two) times daily.    Dispense:  20 capsule    Refill:  0    No Follow-up on file.   Amonie Wisser Paulita Fujita, M.D. [facility] [address] [place], [place]  [address] [phone]  [phone] phone [phone] fax

## 2016-09-02 NOTE — ED Notes (Signed)
Pt OOB to use restroom at this time.

## 2016-09-02 NOTE — Telephone Encounter (Signed)
Patient is in a lot of pain  Asking for pain medication,  Also is there a numbing spray to put on the wound.   [facility] [address]  [phone]

## 2016-09-02 NOTE — ED Notes (Signed)
Pt came to the RN and stated that she was sick to her stomach and tired. Pt updated about the plan of care. Pt made aware of the current status. Verbalized understanding. Tearful.

## 2016-09-02 NOTE — ED Notes (Addendum)
Pt was seen placed in a car and driven away by her CNA at this time. Pt nor CNA made this RN away of leaving or spoke with the RN about leaving.

## 2016-09-02 NOTE — Telephone Encounter (Signed)
Patient was advised to present to the ED for evaluation of SOB; thus, no pain medication was prescribed.  If patient is discharged from ED, then I am happy to send in Tramadol for pain.  She will receive pain medication in the ED.

## 2016-09-02 NOTE — ED Provider Notes (Signed)
[facility]-EM[facility]Y DEPT Provider Note   CSN: [id number] Arrival date & time: 09/02/16  1310     History   Chief Complaint Chief Complaint  Patient presents with  . Chest Pain    HPI [name] is a 60 y.o. female.  60 year old female presents complaining of shortness of breath times several days. Was seen at her doctor's office today and those notes were reviewed. She was treated there for an abscess. Patient also complained of having intermittent sharp left arm pain lasting for seconds which was nonexertional and not positional. No rashes to her skin. Denied any lower leg swelling or edema. Denies any pleuritic component to her symptoms. Patient denies any dyspnea on exertion. No orthopnea. No recent travel history. An EKG was done in the office which has some concerning findings and patient was sent to the ER for further evaluation.      Past Medical History:  Diagnosis Date  . ADHD (attention deficit hyperactivity disorder)   . Anxiety   . Arthritis    "in my neck"  . Diverticulitis large intestine   . Frequency   . GERD (gastroesophageal reflux disease)   . H/O hiatal hernia   . Hepatitis C   . Insomnia   . 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 Urination, excessive at night     Patient Active Problem List   Diagnosis Date Noted  . Esophageal reflux 03/02/2016  . History of hepatitis C 02/26/2015  . H/O colectomy 02/26/2015  . Stricture and stenosis of esophagus 05/22/2012  . Recurrent ventral incisional hernia s/p lap repair with mesh 9/3. 05/20/2012  . ADD 02/28/2010  . ABDOMINAL PAIN-MULTIPLE SITES 02/28/2010  . DIVERTICULITIS, HX OF 02/28/2010  . OTHER ANXIETY STATES 10/02/2009  . HEMORRHAGE OF RECTUM AND ANUS 10/02/2009    Past Surgical History:  Procedure Laterality Date  . ABDOMINAL SURGERY  ~ 2006   "got toothpick out of intestines"  . APPENDECTOMY  2011  . CHOLECYSTECTOMY  2006  . COLECTOMY  2011   subtotal   . DIAGNOSTIC LAPAROSCOPY  08/04/2012   LOA; VHR w/mesh  . ECTOPIC PREGNANCY SURGERY  1982   "twins"  . HERNIA REPAIR  2011  . LEG TENDON SURGERY  ~ 2005   left; "still have a bolt in it"  . ureathal cyst    . VAGINAL HYSTERECTOMY  1990's  . VENTRAL HERNIA REPAIR  08/04/2012   Procedure: LAPAROSCOPIC VENTRAL HERNIA;  Surgeon: Adolph Pollack, MD;  Location: [facility] OR;  Service: General;  Laterality: N/A;  laparoscopic possible open ventral hernia repair with mesh    OB History    No data available       Home Medications    Prior to Admission medications   Medication Sig Start Date End Date Taking? Authorizing Provider  amphetamine-dextroamphetamine (ADDERALL XR) 20 MG 24 hr capsule Take 1 capsule (20 mg total) by mouth daily. Patient not taking: Reported on 09/02/2016 02/21/16   Dorna Leitz, PA-C  amphetamine-dextroamphetamine (ADDERALL XR) 20 MG 24 hr capsule Take 1 capsule (20 mg total) by mouth daily. Patient not taking: Reported on 09/02/2016 05/13/16   Peyton Najjar, MD  amphetamine-dextroamphetamine (ADDERALL XR) 20 MG 24 hr capsule Take 1 capsule (20 mg total) by mouth daily. Patient not taking: Reported on 09/02/2016 05/13/16   Peyton Najjar, MD  doxycycline (VIBRAMYCIN) 100 MG capsule Take 1 capsule (100 mg total) by mouth 2 (two) times daily. 09/02/16  Ethelda Chick, MD  omeprazole (PRILOSEC) 20 MG capsule Take 1 capsule (20 mg total) by mouth daily. 08/16/16   Morrell Riddle, PA-C    Family History Family History  Problem Relation Age of Onset  . Heart disease Mother   . Heart disease Father   . Arthritis Sister   . Colon cancer Maternal Uncle   . Colon polyps Neg Hx   . Prostate cancer Neg Hx   . Rectal cancer Neg Hx     Social History Social History  Substance Use Topics  . Smoking status: Former Smoker    Years: 4.00    Types: Cigarettes    Quit date: 12/02/1994  . Smokeless tobacco: Never Used     Comment: 08/04/2012 "quit smoking cigarettes 15-20 yr  ago; really a recreational thing"  . Alcohol use 2.4 oz/week    4 Glasses of wine per week     Allergies   Review of patient's allergies indicates no known allergies.   Review of Systems Review of Systems  All other systems reviewed and are negative.    Physical Exam Updated Vital Signs BP 113/72 (BP Location: Right Arm)   Pulse 84   Temp 98.1 F (36.7 C) (Oral)   Resp 14   Ht 5\' 4"  (1.626 m)   Wt 71.2 kg   SpO2 99%   BMI 26.95 kg/m   Physical Exam  Constitutional: She is oriented to person, place, and time. She appears well-developed and well-nourished.  Non-toxic appearance. No distress.  HENT:  Head: Normocephalic and atraumatic.  Eyes: Conjunctivae, EOM and lids are normal. Pupils are equal, round, and reactive to light.  Neck: Normal range of motion. Neck supple. No tracheal deviation present. No thyroid mass present.  Cardiovascular: Normal rate, regular rhythm and normal heart sounds.  Exam reveals no gallop.   No murmur heard. Pulmonary/Chest: Effort normal and breath sounds normal. No stridor. No respiratory distress. She has no decreased breath sounds. She has no wheezes. She has no rhonchi. She has no rales.  Abdominal: Soft. Normal appearance and bowel sounds are normal. She exhibits no distension. There is no tenderness. There is no rebound and no CVA tenderness.  Musculoskeletal: Normal range of motion. She exhibits no edema or tenderness.  Neurological: She is alert and oriented to person, place, and time. She has normal strength. No cranial nerve deficit or sensory deficit. GCS eye subscore is 4. GCS verbal subscore is 5. GCS motor subscore is 6.  Skin: Skin is warm and dry. No abrasion and no rash noted.  Psychiatric: She has a normal mood and affect. Her speech is normal and behavior is normal.  Nursing note and vitals reviewed.    ED Treatments / Results  Labs (all labs ordered are listed, but only abnormal results are displayed) Labs Reviewed    BASIC METABOLIC PANEL - Abnormal; Notable for the following:       Result Value   CO2 21 (*)    All other components within normal limits  CBC  I-STAT TROPOININ, ED    EKG  EKG Interpretation  Date/Time:  Monday September 02 2016 13:24:40 EDT Ventricular Rate:  82 PR Interval:  152 QRS Duration: 70 QT Interval:  398 QTC Calculation: 464 R Axis:   44 Text Interpretation:  Normal sinus rhythm Right atrial enlargement Borderline ECG No significant change since last tracing Confirmed by Freida Busman  MD, Zhi Geier ([id number]) on 09/02/2016 7:41:34 PM       Radiology Dg Chest  2 View  Result Date: 09/02/2016 CLINICAL DATA:  Shortness of breath. EXAM: CHEST  2 VIEW COMPARISON:  10/21/2011. FINDINGS: Mediastinum hilar structures normal. Mild left base subsegmental atelectasis. Heart size normal. No acute bony abnormality. Prior abdominal wall hernia repair. IMPRESSION: Mild left base subsegmental atelectasis. Electronically Signed   By: Maisie Fushomas  Register   On: 09/02/2016 10:45    Procedures Procedures (including critical care time)  Medications Ordered in ED Medications  oxyCODONE-acetaminophen (PERCOCET/ROXICET) 5-325 MG per tablet 1 tablet (1 tablet Oral Given 09/02/16 1329)  oxyCODONE-acetaminophen (PERCOCET/ROXICET) 5-325 MG per tablet (not administered)  ondansetron (ZOFRAN-ODT) 4 MG disintegrating tablet (not administered)  ondansetron (ZOFRAN-ODT) disintegrating tablet 4 mg (4 mg Oral Given 09/02/16 1520)     Initial Impression / Assessment and Plan / ED Course  I have reviewed the triage vital signs and the nursing notes.  Pertinent labs & imaging results that were available during my care of the patient were reviewed by me and considered in my medical decision making (see chart for details).  Clinical Course    Pt w/ negative chest ct for pe Troponin neg as well HEART score 2, low risk for acs Stable for d/c Return precautions given  Final Clinical Impressions(s) / ED Diagnoses    Final diagnoses:  None    New Prescriptions New Prescriptions   No medications on file     Lorre NickAnthony Lillyian Heidt, MD 09/02/16 2347

## 2016-09-02 NOTE — Progress Notes (Signed)
PROCEDURE NOTE: I&D of Abscess Verbal consent obtained. Local anesthesia with 2cc of 2% lidocaine with epi. Site cleansed with Betadine.  Incision of 0.75cm was made using a 11 blade, discharge of minimal amount of pus and serosanguinous fluid. Wound cavity was explored with curved hemostats and aggressively packed with 0.25" plain packing. Cleansed and dressed. After care instructions provided. Patient to return to clinic on 2 for reevaluation/repacking.  Benjiman CoreBrittany Kodee Ravert, PA-C  [facility] 09/02/2016 12:17 PM

## 2016-09-03 LAB — URINE CULTURE: Organism ID, Bacteria: NO GROWTH

## 2016-09-03 NOTE — ED Notes (Signed)
Pt d/c home  

## 2016-09-04 ENCOUNTER — Encounter: Payer: Self-pay | Admitting: Family Medicine

## 2016-09-04 ENCOUNTER — Ambulatory Visit (INDEPENDENT_AMBULATORY_CARE_PROVIDER_SITE_OTHER): Payer: BLUE CROSS/BLUE SHIELD | Admitting: Family Medicine

## 2016-09-04 VITALS — BP 124/80 | HR 81 | Temp 98.1°F | Resp 18 | Ht 64.0 in | Wt 157.0 lb

## 2016-09-04 DIAGNOSIS — F901 Attention-deficit hyperactivity disorder, predominantly hyperactive type: Secondary | ICD-10-CM

## 2016-09-04 DIAGNOSIS — R0602 Shortness of breath: Secondary | ICD-10-CM

## 2016-09-04 DIAGNOSIS — L02415 Cutaneous abscess of right lower limb: Secondary | ICD-10-CM

## 2016-09-04 MED ORDER — AMPHETAMINE-DEXTROAMPHET ER 20 MG PO CP24: 20.0000 mg | ORAL_CAPSULE | Freq: Every day | ORAL | 0 refills | Status: DC

## 2016-09-04 NOTE — Patient Instructions (Addendum)
     IF you received an x-ray today, you will receive an invoice from [facility]. Please contact [facility] at [phone] with questions or concerns regarding your invoice.   IF you received labwork today, you will receive an invoice from [facility]. Please contact [facility] at [phone] with questions or concerns regarding your invoice.   Our billing staff will not be able to assist you with questions regarding bills from these companies.  You will be contacted with the lab results as soon as they are available. The fastest way to get your results is to activate your My Chart account. Instructions are located on the last page of this paperwork. If you have not heard from us regarding the results in 2 weeks, please contact this office.      

## 2016-09-04 NOTE — Progress Notes (Signed)
Subjective:    Patient ID: [name], female    DOB: Oct 23, 1956, 60 y.o.   MRN: [id number]  09/04/2016  Wound Check   HPI This 60 y.o. female presents for evaluation of R buttocks abscess/cellulitis.  After local anesthetic wore off and felt horrible pain.  Doing much better.  No fever.  Still feeling a little badly; s/p 3 doses.  Nose looks much better; decrease swelling and redness.  Has not changed the bandage since discharge from our office.  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  Shortness of breath:  S/p ED evaluation; s/p CT angio negative for pulmonary embolism.  S/p troponin that was negative.  Discharged home.  Feels like breathing was due to acute infection.  Shortness of breath is much improved.  ADHD: saw Dr. Alwyn Ren in 05/2016 for refill of Adderall XR 20mg  daily; referred to ADHD clinic.  Did not want to go to a specialist; has decided to stop ADHD medication. Adderall made patient could focus and stay on a subject matter longer.  Struggled in school; unable to read to granddaughter.  Diagnosed with ADHD when undergoing psychological evaluation for Hepatitis C prior to initiating interferon.  No evidence of depression; diagnosed with ADHD.    Wt Readings from Last 3 Encounters:  09/04/16 157 lb (71.2 kg)  09/02/16 157 lb (71.2 kg)  09/02/16 157 lb (71.2 kg)    Review of Systems  Constitutional: Negative for chills, diaphoresis, fatigue and fever.  Eyes: Negative for visual disturbance.  Respiratory: Negative for cough and shortness of breath.   Cardiovascular: Negative for chest pain, palpitations and leg swelling.  Gastrointestinal: Negative for abdominal pain, constipation, diarrhea, nausea and vomiting.  Endocrine: Negative for cold intolerance, heat intolerance, polydipsia, polyphagia and polyuria.  Skin: Positive for color change and wound.  Neurological: Negative for dizziness, tremors, seizures, syncope, facial asymmetry, speech difficulty, weakness,  light-headedness, numbness and headaches.  Psychiatric/Behavioral: Positive for decreased concentration and sleep disturbance. Negative for dysphoric mood, hallucinations, self-injury and suicidal ideas. The patient is nervous/anxious and is hyperactive.     Past Medical History:  Diagnosis Date  . ADHD (attention deficit hyperactivity disorder)   . Anxiety   . Arthritis    "in my neck"  . Diverticulitis large intestine   . Frequency   . GERD (gastroesophageal reflux disease)   . H/O hiatal hernia   . Hepatitis C   . Insomnia   . 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 Urination, excessive at night    Past Surgical History:  Procedure Laterality Date  . ABDOMINAL SURGERY  ~ 2006   "got toothpick out of intestines"  . APPENDECTOMY  2011  . CHOLECYSTECTOMY  2006  . COLECTOMY  2011   subtotal   . DIAGNOSTIC LAPAROSCOPY  08/04/2012   LOA; VHR w/mesh  . ECTOPIC PREGNANCY SURGERY  1982   "twins"  . HERNIA REPAIR  2011  . LEG TENDON SURGERY  ~ 2005   left; "still have a bolt in it"  . ureathal cyst    . VAGINAL HYSTERECTOMY  1990's  . VENTRAL HERNIA REPAIR  08/04/2012   Procedure: LAPAROSCOPIC VENTRAL HERNIA;  Surgeon: Adolph Pollack, MD;  Location: [facility] OR;  Service: General;  Laterality: N/A;  laparoscopic possible open ventral hernia repair with mesh   No Known Allergies  Social History   Social History  . Marital status: Divorced    Spouse name: N/A  . Number of children:  2  . Years of education: N/A   Occupational History  . 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 Smoking status: Former Smoker    Years: 4.00    Types: Cigarettes    Quit date: 12/02/1994  . Smokeless tobacco: Never Used     Comment: 08/04/2012 "quit smoking cigarettes 15-20 yr ago; really a recreational thing"  . Alcohol use 2.4 oz/week    4 Glasses of wine per week  . Drug use: No  . Sexual activity: Yes   Other Topics  Concern  . Not on file   Social History Narrative  . No narrative on file   Family History  Problem Relation Age of Onset  . Heart disease Mother   . Heart disease Father   . Arthritis Sister   . Colon cancer Maternal Uncle   . Colon polyps Neg Hx   . Prostate cancer Neg Hx   . Rectal cancer Neg Hx        Objective:    BP 124/80 (BP Location: Right Arm, Patient Position: Sitting, Cuff Size: Small)   Pulse 81   Temp 98.1 F (36.7 C) (Oral)   Resp 18   Ht 5\' 4"  (1.626 m)   Wt 157 lb (71.2 kg)   SpO2 98%   BMI 26.95 kg/m  Physical Exam  Constitutional: She is oriented to person, place, and time. She appears well-developed and well-nourished. No distress.  HENT:  Head: Normocephalic and atraumatic.  Right Ear: External ear normal.  Left Ear: External ear normal.  Nose: Nose normal.  Mouth/Throat: Oropharynx is clear and moist.  Eyes: Conjunctivae and EOM are normal. Pupils are equal, round, and reactive to light.  Neck: Normal range of motion. Neck supple. Carotid bruit is not present. No thyromegaly present.  Cardiovascular: Normal rate, regular rhythm, normal heart sounds and intact distal pulses.  Exam reveals no gallop and no friction rub.   No murmur heard. Pulmonary/Chest: Effort normal and breath sounds normal. She has no wheezes. She has no rales.  Abdominal: Soft. Bowel sounds are normal. She exhibits no distension and no mass. There is no tenderness. There is no rebound and no guarding.  Lymphadenopathy:    She has no cervical adenopathy.  Neurological: She is alert and oriented to person, place, and time. No cranial nerve deficit.  Skin: Skin is warm and dry. No rash noted. She is not diaphoretic. No erythema. No pallor.  R buttocks/hip abscess/cellulitis with induration, erythema, tenderness; packing intact with moderate amount of bloody-yellow drainage present.  Psychiatric: She has a normal mood and affect. Her behavior is normal.   Results for orders  placed or performed during the hospital encounter of 09/02/16  Basic metabolic panel  Result Value Ref Range   Sodium 138 135 - 145 mmol/L   Potassium 4.0 3.5 - 5.1 mmol/L   Chloride 108 101 - 111 mmol/L   CO2 21 (L) 22 - 32 mmol/L   Glucose, Bld 98 65 - 99 mg/dL   BUN 8 6 - 20 mg/dL   Creatinine, Ser 8.290.69 0.44 - 1.00 mg/dL   Calcium 9.1 8.9 - 56.210.3 mg/dL   GFR calc non Af Amer >60 >60 mL/min   GFR calc Af Amer >60 >60 mL/min   Anion gap 9 5 - 15  CBC  Result Value Ref Range   WBC 8.4 4.0 - 10.5 K/uL   RBC 4.72 3.87 - 5.11 MIL/uL   Hemoglobin 14.0 12.0 -  15.0 g/dL   HCT 16.1 09.6 - 04.5 %   MCV 89.8 78.0 - 100.0 fL   MCH 29.7 26.0 - 34.0 pg   MCHC 33.0 30.0 - 36.0 g/dL   RDW 40.9 81.1 - 91.4 %   Platelets 223 150 - 400 K/uL  I-stat troponin, ED  Result Value Ref Range   Troponin i, poc 0.01 0.00 - 0.08 ng/mL   Comment 3               Assessment & Plan:   1. Abscess of leg, right   2. Attention deficit hyperactivity disorder (ADHD), predominantly hyperactive type   3. Shortness of breath    -s/p repacking of wound; preliminary wound culture + staph aureus.  Sensitivities pending.   -s/p ED evaluation for SOB; s/p CT angio and troponin.   -agreeable to fill Adderall XR 20mg  daily; three refills provided; RTC three months.   No orders of the defined types were placed in this encounter.  Meds ordered this encounter  Medications  . amphetamine-dextroamphetamine (ADDERALL XR) 20 MG 24 hr capsule    Sig: Take 1 capsule (20 mg total) by mouth daily.    Dispense:  30 capsule    Refill:  0    May fill 60 days after signing  . amphetamine-dextroamphetamine (ADDERALL XR) 20 MG 24 hr capsule    Sig: Take 1 capsule (20 mg total) by mouth daily.    Dispense:  30 capsule    Refill:  0    May fill 30 days after signed.  Marland Kitchen amphetamine-dextroamphetamine (ADDERALL XR) 20 MG 24 hr capsule    Sig: Take 1 capsule (20 mg total) by mouth daily.    Dispense:  30 capsule    Refill:  0     No Follow-up on file.   Anasophia Pecor Paulita Fujita, M.D. [facility] [address] [place], [place]  [address] [phone] phone [phone] fax

## 2016-09-04 NOTE — Progress Notes (Signed)
Dressing and packing removed. No pus was expressed from wound. Wound cavity repacked with 0.4" packing. Pt did not tolerate well due to pain associated with repacking, therefore the wound was not pack as aggressively as it would have been otherwise. Dressing applied. Pt to return for packing removal in 2 days.  Benjiman CoreBrittany Wiseman, PA-C  [facility] 09/05/2016 8:27 AM

## 2016-09-05 LAB — WOUND CULTURE
GRAM STAIN: NONE SEEN
GRAM STAIN: NONE SEEN

## 2016-09-06 ENCOUNTER — Ambulatory Visit (INDEPENDENT_AMBULATORY_CARE_PROVIDER_SITE_OTHER): Payer: BLUE CROSS/BLUE SHIELD | Admitting: Physician Assistant

## 2016-09-06 VITALS — BP 136/80 | HR 77 | Temp 98.4°F | Resp 16 | Ht 64.5 in | Wt 159.2 lb

## 2016-09-06 DIAGNOSIS — Z5189 Encounter for other specified aftercare: Secondary | ICD-10-CM

## 2016-09-06 NOTE — Patient Instructions (Signed)
     IF you received an x-ray today, you will receive an invoice from [facility]. Please contact [facility] at [phone] with questions or concerns regarding your invoice.   IF you received labwork today, you will receive an invoice from [facility]. Please contact [facility] at [phone] with questions or concerns regarding your invoice.   Our billing staff will not be able to assist you with questions regarding bills from these companies.  You will be contacted with the lab results as soon as they are available. The fastest way to get your results is to activate your My Chart account. Instructions are located on the last page of this paperwork. If you have not heard from us regarding the results in 2 weeks, please contact this office.      

## 2016-09-06 NOTE — Progress Notes (Signed)
Patient ID: [name], female   DOB: 01-Jul-1956, 60 y.o.   MRN: [id number]   By signing my name below I, [name], attest that this documentation has been prepared under the direction Michelle in the presence of [name], [place]. Electonically Signed. [name], Scribe 09/07/2016 at 3:44 PM  09/07/2016 8:50 PM   DOB: 01-Jul-1956 / MRN: [id number]  SUBJECTIVE:   [name] is a 60 y.o. female presenting for wound recheck. Pt [facility] by [name] for rt buttock abscess on 09/02/16. I&D performed at that time, wound was packed, pt started on doxycycline Michelle planned for wound recheck in 2 days. Pt returned on 09/04/16 Michelle had wound repacked Michelle planned for reevaluation in 2 days.   Pt has been compliant with her medication Michelle wound care.    Michelle has No Known Allergies.   Michelle  has a past medical history of ADHD (attention deficit hyperactivity disorder); Anxiety; Arthritis; Diverticulitis large intestine; Frequency; GERD (gastroesophageal reflux disease); H/O hiatal hernia; Hepatitis C; Insomnia; Neuromuscular disorder (HCC); Michelle Urination, excessive at night.    Michelle  reports that Michelle quit smoking about 21 years ago. Her smoking use included Cigarettes. Michelle quit after 4.00 years of use. Michelle has never used smokeless tobacco. Michelle reports that Michelle drinks about 2.4 oz of alcohol per week . Michelle reports that Michelle does not use drugs. Michelle  reports that Michelle currently engages in sexual activity. The patient  has a past surgical history that includes Abdominal surgery (~ 2006); Hernia repair (2011); ureathal cyst; Ectopic pregnancy surgery (1982); Leg Tendon Surgery (~ 2005); Diagnostic laparoscopy (08/04/2012); Appendectomy (2011); Cholecystectomy (2006); Vaginal hysterectomy (1990's); Ventral hernia repair (08/04/2012); Michelle Colectomy (2011).  Her family history includes Arthritis in her sister; Colon cancer in her maternal uncle; Heart disease in her father Michelle  mother.  Review of Systems  Constitutional: Negative for fever.  Skin:       Pt is positive for healing wound on rt buttock status post I&D.    The problem list Michelle medications were reviewed Michelle updated by myself where necessary Michelle exist elsewhere in the encounter.   OBJECTIVE:  BP 136/80 (BP Location: Right Arm, Patient Position: Sitting, Cuff Size: Large)    Pulse 77    Temp 98.4 F (36.9 C) (Oral)    Resp 16    Ht 5' 4.5" (1.638 m)    Wt 159 lb 3.2 oz (72.2 kg)    SpO2 97%    BMI 26.90 kg/m  Vitals normal. Physical Exam  Constitutional: [name], [name], Michelle time. Michelle appears well-developed Michelle well-nourished. No distress.  HENT:  Head: Normocephalic Michelle atraumatic.  Eyes: Conjunctivae are normal. Pupils are equal, round, Michelle reactive to light.  Neck: Neck supple.  Cardiovascular: Normal rate.   Pulmonary/Chest: Effort normal.  Musculoskeletal: Normal range of motion.  Neurological: Michelle is alert Michelle oriented to [name], [name], Michelle time.  Skin: Skin is warm Michelle dry.  Status post I&D. Wound is healing as expected, packing has fallen out. No exudate, no induration, no increased warmth.   Psychiatric: Michelle has a normal mood Michelle affect. Her behavior is normal.  Nursing note Michelle vitals reviewed.   No results found for this or any previous visit (from the past 72 hour(s)).  No results found.  ASSESSMENT Michelle PLAN  [name] was seen today for wound check.  Diagnoses Michelle all orders for this visit:  Encounter for wound  care: No need for further follow up. Packing has fallen out on its own.  RTC as needed.      The patient is advised to call or return to clinic if Michelle does not see an improvement in symptoms, or to seek the care of the closest emergency department if Michelle worsens with the above plan.   [name], MHS, PA-C [facility] 09/07/2016 8:50 PM

## 2016-09-09 NOTE — Telephone Encounter (Signed)
Pt has been in to be seen since this message.

## 2016-12-05 ENCOUNTER — Telehealth: Payer: Self-pay

## 2016-12-05 NOTE — Telephone Encounter (Signed)
PATIENT WOULD LIKE DR. Katrinka BlazingSMITH TO KNOW THAT SHE NEEDS A REFILL ON HER ADDERALL XR 20 MG. SHE IS NOT SURE IF IT IS TIME FOR HER TO BE SEEN OR NOT? BEST PHONE [phone] (WORK FROM 7:00 - 3:30) OR CELL [phone].  MBC

## 2016-12-07 ENCOUNTER — Other Ambulatory Visit: Payer: Self-pay

## 2016-12-07 DIAGNOSIS — F901 Attention-deficit hyperactivity disorder, predominantly hyperactive type: Secondary | ICD-10-CM

## 2016-12-07 NOTE — Telephone Encounter (Signed)
Pt called in message to have adderall refilled. To Dr. Katrinka BlazingSmith

## 2016-12-11 NOTE — Telephone Encounter (Signed)
Refill of Adderall denied.  Pt is due for three month follow-up; please schedule OV with me.  Pt must see ME for refills of Adderall.

## 2016-12-17 NOTE — Telephone Encounter (Signed)
Pt advised and tx up front for appt. 

## 2016-12-25 ENCOUNTER — Ambulatory Visit: Payer: BLUE CROSS/BLUE SHIELD | Admitting: Family Medicine

## 2017-01-15 ENCOUNTER — Ambulatory Visit (INDEPENDENT_AMBULATORY_CARE_PROVIDER_SITE_OTHER): Payer: BLUE CROSS/BLUE SHIELD | Admitting: Family Medicine

## 2017-01-15 ENCOUNTER — Encounter: Payer: Self-pay | Admitting: Family Medicine

## 2017-01-15 VITALS — BP 134/80 | HR 81 | Temp 98.1°F | Resp 16 | Ht 64.0 in | Wt 153.0 lb

## 2017-01-15 DIAGNOSIS — Z0000 Encounter for general adult medical examination without abnormal findings: Secondary | ICD-10-CM | POA: Diagnosis not present

## 2017-01-15 DIAGNOSIS — K222 Esophageal obstruction: Secondary | ICD-10-CM

## 2017-01-15 DIAGNOSIS — Z9049 Acquired absence of other specified parts of digestive tract: Secondary | ICD-10-CM | POA: Diagnosis not present

## 2017-01-15 DIAGNOSIS — R0789 Other chest pain: Secondary | ICD-10-CM | POA: Diagnosis not present

## 2017-01-15 DIAGNOSIS — K219 Gastro-esophageal reflux disease without esophagitis: Secondary | ICD-10-CM | POA: Diagnosis not present

## 2017-01-15 DIAGNOSIS — Z8619 Personal history of other infectious and parasitic diseases: Secondary | ICD-10-CM | POA: Diagnosis not present

## 2017-01-15 DIAGNOSIS — Z131 Encounter for screening for diabetes mellitus: Secondary | ICD-10-CM | POA: Diagnosis not present

## 2017-01-15 DIAGNOSIS — Z13220 Encounter for screening for lipoid disorders: Secondary | ICD-10-CM | POA: Diagnosis not present

## 2017-01-15 DIAGNOSIS — F901 Attention-deficit hyperactivity disorder, predominantly hyperactive type: Secondary | ICD-10-CM

## 2017-01-15 LAB — POCT URINALYSIS DIP (MANUAL ENTRY)
BILIRUBIN UA: NEGATIVE
GLUCOSE UA: NEGATIVE
Ketones, POC UA: NEGATIVE
Leukocytes, UA: NEGATIVE
Nitrite, UA: NEGATIVE
Protein Ur, POC: NEGATIVE
SPEC GRAV UA: 1.02
UROBILINOGEN UA: 0.2
pH, UA: 6

## 2017-01-15 MED ORDER — AMPHETAMINE-DEXTROAMPHET ER 20 MG PO CP24: 20.0000 mg | ORAL_CAPSULE | Freq: Every day | ORAL | 0 refills | Status: DC

## 2017-01-15 NOTE — Patient Instructions (Addendum)
   IF you received an x-ray today, you will receive an invoice from [facility]. Please contact [facility] at [phone] with questions or concerns regarding your invoice.   IF you received labwork today, you will receive an invoice from [facility]. Please contact [facility] at [phone] with questions or concerns regarding your invoice.   Our billing staff will not be able to assist you with questions regarding bills from these companies.  You will be contacted with the lab results as soon as they are available. The fastest way to get your results is to activate your My Chart account. Instructions are located on the last page of this paperwork. If you have not heard from us regarding the results in 2 weeks, please contact this office.     Keeping You Healthy  Get These Tests  Blood Pressure- Have your blood pressure checked by your healthcare provider at least once a year.  Normal blood pressure is 120/80.  Weight- Have your body mass index (BMI) calculated to screen for obesity.  BMI is a measure of body fat based on height and weight.  You can calculate your own BMI at www.nhlbisupport.com/bmi/  Cholesterol- Have your cholesterol checked every year.  Diabetes- Have your blood sugar checked every year if you have high blood pressure, high cholesterol, a family history of diabetes or if you are overweight.  Pap Test - Have a pap test every 1 to 5 years if you have been sexually active.  If you are older than 65 and recent pap tests have been normal you may not need additional pap tests.  In addition, if you have had a hysterectomy  for benign disease additional pap tests are not necessary.  Mammogram-Yearly mammograms are essential for early detection of breast cancer  Screening for Colon Cancer- Colonoscopy starting at age 50. Screening may begin sooner depending on your family history and other health conditions.  Follow up colonoscopy as directed by your  Gastroenterologist.  Screening for Osteoporosis- Screening begins at age 65 with bone density scanning, sooner if you are at higher risk for developing Osteoporosis.  Get these medicines  Calcium with Vitamin D- Your body requires 1200-1500 mg of Calcium a day and 800-1000 IU of Vitamin D a day.  You can only absorb 500 mg of Calcium at a time therefore Calcium must be taken in 2 or 3 separate doses throughout the day.  Hormones- Hormone therapy has been associated with increased risk for certain cancers and heart disease.  Talk to your healthcare provider about if you need relief from menopausal symptoms.  Aspirin- Ask your healthcare provider about taking Aspirin to prevent Heart Disease and Stroke.  Get these Immuniztions  Flu shot- Every fall  Pneumonia shot- Once after the age of 65; if you are younger ask your healthcare provider if you need a pneumonia shot.  Tetanus- Every ten years.  Zostavax- Once after the age of 60 to prevent shingles.  Take these steps  Don't smoke- Your healthcare provider can help you quit. For tips on how to quit, ask your healthcare provider or go to www.smokefree.gov or call 1-800 QUIT-NOW.  Be physically active- Exercise 5 days a week for a minimum of 30 minutes.  If you are not already physically active, start slow and gradually work up to 30 minutes of moderate physical activity.  Try walking, dancing, bike riding, swimming, etc.  Eat a healthy diet- Eat a variety of healthy foods such as fruits, vegetables, whole grains, low   fat milk, low fat cheeses, yogurt, lean meats, chicken, fish, eggs, dried beans, tofu, etc.  For more information go to www.thenutritionsource.org  Dental visit- Brush and floss teeth twice daily; visit your dentist twice a year.  Eye exam- Visit your Optometrist or Ophthalmologist yearly.  Drink alcohol in moderation- Limit alcohol intake to one drink or less a day.  Never drink and drive.  Depression- Your emotional  health is as important as your physical health.  If you're feeling down or losing interest in things you normally enjoy, please talk to your healthcare provider.  Seat Belts- can save your life; always wear one  Smoke/Carbon Monoxide detectors- These detectors need to be installed on the appropriate level of your home.  Replace batteries at least once a year.  Violence- If anyone is threatening or hurting you, please tell your healthcare provider.  Living Will/ Health care power of attorney- Discuss with your healthcare provider and family.  

## 2017-01-15 NOTE — Progress Notes (Signed)
Subjective:    Patient ID: [name], female    DOB: 09-16-1956, 61 y.o.   MRN: [id number]  01/15/2017  Annual Exam (pap) and Medication Refill (adderall)   HPI This 61 y.o. female presents for Complete Physical Examination.  Last physical:  02-21-2016 Pap smear:  2015 Mammogram: 03-28-2016 Colonoscopy:  02-28-15 with EGD  Immunization History  Administered Date(s) Administered  . Influenza Split 09/01/2016  . Tdap 02/23/2015  . Zoster 03/17/2015   BP Readings from Last 3 Encounters:  01/15/17 (!) 146/79  09/06/16 136/80  09/04/16 124/80   Wt Readings from Last 3 Encounters:  01/15/17 153 lb (69.4 kg)  09/06/16 159 lb 3.2 oz (72.2 kg)  09/04/16 157 lb (71.2 kg)   ADD: has been off of Adderall for one month due to weight gain.  Had started picking legs. Does better with Adderall.   Every time starts Adderall, starts gaining weight.  Off of Adderall, forgets to eat.    Chest pain: does admit to intermittent chest pain with exertion while at work. Usually goes away with rest.  Never thought much about it. Does suffer with DOE with chest pain.   Review of Systems  Constitutional: Negative for activity change, appetite change, chills, diaphoresis, fatigue, fever and unexpected weight change.  HENT: Negative for congestion, dental problem, drooling, ear discharge, ear pain, facial swelling, hearing loss, mouth sores, nosebleeds, postnasal drip, rhinorrhea, sinus pressure, sneezing, sore throat, tinnitus, trouble swallowing and voice change.   Eyes: Negative for photophobia, pain, discharge, redness, itching and visual disturbance.  Respiratory: Negative for apnea, cough, choking, chest tightness, shortness of breath, wheezing and stridor.   Cardiovascular: Positive for chest pain. Negative for palpitations and leg swelling.  Gastrointestinal: Positive for abdominal pain. Negative for abdominal distention, anal bleeding, blood in stool, constipation, diarrhea, nausea, rectal  pain and vomiting.  Endocrine: Negative for cold intolerance, heat intolerance, polydipsia, polyphagia and polyuria.  Genitourinary: Negative for decreased urine volume, difficulty urinating, dyspareunia, dysuria, enuresis, flank pain, frequency, genital sores, hematuria, menstrual problem, pelvic pain, urgency, vaginal bleeding, vaginal discharge and vaginal pain.       Nocturia x 2.  Urinary leakage intermittently urge and stress.  Does not wear pad.  Musculoskeletal: Negative for arthralgias, back pain, gait problem, joint swelling, myalgias, neck pain and neck stiffness.  Skin: Negative for color change, pallor, rash and wound.  Allergic/Immunologic: Negative for environmental allergies, food allergies and immunocompromised state.  Neurological: Negative for dizziness, tremors, seizures, syncope, facial asymmetry, speech difficulty, weakness, light-headedness, numbness and headaches.  Hematological: Negative for adenopathy. Does not bruise/bleed easily.  Psychiatric/Behavioral: Positive for decreased concentration and sleep disturbance. Negative for agitation, behavioral problems, confusion, dysphoric mood, hallucinations, self-injury and suicidal ideas. The patient is nervous/anxious. The patient is not hyperactive.     Past Medical History:  Diagnosis Date  . ADHD (attention deficit hyperactivity disorder)   . Anxiety   . Arthritis    "in my neck"  . Diverticulitis large intestine   . Frequency   . GERD (gastroesophageal reflux disease)   . H/O hiatal hernia   . Hepatitis C   . Insomnia   . 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 Urination, excessive at night    Past Surgical History:  Procedure Laterality Date  . ABDOMINAL SURGERY  ~ 2006   "got toothpick out of intestines"  . APPENDECTOMY  2011  . CHOLECYSTECTOMY  2006  . COLECTOMY  2011   subtotal   .  DIAGNOSTIC LAPAROSCOPY  08/04/2012   LOA; VHR w/mesh  . ECTOPIC PREGNANCY SURGERY   1982   "twins"  . HERNIA REPAIR  2011  . LEG TENDON SURGERY  ~ 2005   left; "still have a bolt in it"  . ureathal cyst    . VAGINAL HYSTERECTOMY  1990's   enodmetriosis; ectopic pregnancies x 2; ovaries resected.  Marland Kitchen VENTRAL HERNIA REPAIR  08/04/2012   Procedure: LAPAROSCOPIC VENTRAL HERNIA;  Surgeon: Adolph Pollack, MD;  Location: [facility];  Service: General;  Laterality: N/A;  laparoscopic possible open ventral hernia repair with mesh   No Known Allergies  Social History   Social History  . Marital status: Divorced    Spouse name: N/A  . Number of children: 2  . Years of education: N/A   Occupational History  . Scientist, clinical (histocompatibility and immunogenetics) [employer]   Social History Main Topics  . Smoking status: Former Smoker    Years: 4.00    Types: Cigarettes    Quit date: 12/02/1994  . Smokeless tobacco: Never Used     Comment: 08/04/2012 "quit smoking cigarettes 15-20 yr ago; really a recreational thing"  . Alcohol use 2.4 oz/week    4 Glasses of wine per week  . Drug use: No  . Sexual activity: Yes    Birth control/ protection: Surgical   Other Topics Concern  . Not on file   Social History Narrative   Marital status: divorced; dating seriously in 2018 for 25 years      Children:  2 children; 9 grandchildren; 7 gg.      Lives: with mother      Employment: Engineer, materials      Tobacco: none      Alcohol: none      Drugs: none      Exercise: job physically active      Sexual activity: yes;    Family History  Problem Relation Age of Onset  . Heart disease Mother   . Heart disease Father 78    CAD/PTCA  . Alcohol abuse Father   . Arthritis Sister   . Colon cancer Maternal Uncle   . Colon polyps Neg Hx   . Prostate cancer Neg Hx   . Rectal cancer Neg Hx        Objective:    BP (!) 146/79   Pulse 81   Temp 98.1 F (36.7 C) (Oral)   Resp 16   Ht 5\' 4"  (1.626 m)   Wt 153 lb (69.4 kg)   SpO2 99%   BMI 26.26 kg/m  Physical Exam  Constitutional: She is oriented  to person, place, and time. She appears well-developed and well-nourished. No distress.  HENT:  Head: Normocephalic and atraumatic.  Right Ear: External ear normal.  Left Ear: External ear normal.  Nose: Nose normal.  Mouth/Throat: Oropharynx is clear and moist.  Eyes: Conjunctivae and EOM are normal. Pupils are equal, round, and reactive to light.  Neck: Normal range of motion and full passive range of motion without pain. Neck supple. No JVD present. Carotid bruit is not present. No thyromegaly present.  Cardiovascular: Normal rate, regular rhythm and normal heart sounds.  Exam reveals no gallop and no friction rub.   No murmur heard. Pulmonary/Chest: Effort normal and breath sounds normal. She has no wheezes. She has no rales. Right breast exhibits no inverted nipple, no mass, no nipple discharge and no skin change. Left breast exhibits no inverted nipple, no mass, no nipple  discharge, no skin change and no tenderness. Breasts are symmetrical.  Abdominal: Soft. Bowel sounds are normal. She exhibits no distension and no mass. There is no tenderness. There is no rebound and no guarding.  Genitourinary: Vagina normal. There is no rash, tenderness, lesion or injury on the right labia. There is no rash, tenderness, lesion or injury on the left labia. Right adnexum displays no mass, no tenderness and no fullness. Left adnexum displays no mass, no tenderness and no fullness. No erythema, tenderness or bleeding in the vagina. No foreign body in the vagina. No signs of injury around the vagina. No vaginal discharge found.  Musculoskeletal:       Right shoulder: Normal.       Left shoulder: Normal.       Cervical back: Normal.  Lymphadenopathy:    She has no cervical adenopathy.  Neurological: She is alert and oriented to person, place, and time. She has normal reflexes. No cranial nerve deficit. She exhibits normal muscle tone. Coordination normal.  Skin: Skin is warm and dry. No rash noted. She is  not diaphoretic. No erythema. No pallor.  Psychiatric: She has a normal mood and affect. Her behavior is normal. Judgment and thought content normal.  Nursing note and vitals reviewed.  Results for orders placed or performed in visit on 01/15/17  POCT urinalysis dipstick  Result Value Ref Range   Color, UA yellow yellow   Clarity, UA clear clear   Glucose, UA negative negative   Bilirubin, UA negative negative   Ketones, POC UA negative negative   Spec Grav, UA 1.020    Blood, UA small (A) negative   pH, UA 6.0    Protein Ur, POC negative negative   Urobilinogen, UA 0.2    Nitrite, UA Negative Negative   Leukocytes, UA Negative Negative   Depression screen [facility] 2/9 01/15/2017 09/06/2016 09/04/2016 09/02/2016 05/13/2016  Decreased Interest 0 0 0 0 0  Down, Depressed, Hopeless 0 0 0 0 0  PHQ - 2 Score 0 0 0 0 0   Fall Risk  01/15/2017 09/06/2016 09/04/2016 09/02/2016 05/13/2016  Falls in the past year? No No No No No       Assessment & Plan:   1. Routine physical examination   2. History of hepatitis C   3. Stricture and stenosis of esophagus   4. H/O colectomy   5. Screening for diabetes mellitus   6. Screening, lipid   7. Attention deficit hyperactivity disorder (ADHD), predominantly hyperactive type   8. Gastroesophageal reflux disease without esophagitis   9. Other chest pain    -anticipatory guidance provided. -refer to cardiology due to intermittent exertional chest pain.  To ED for recurrence; avoid exercise until undergoes evaluation by cardiology. -obtain age appropriate screening labs. -refills of Adderall provided.   Orders Placed This Encounter  Procedures  . CBC with Differential/Platelet  . Comprehensive metabolic panel    Order Specific Question:   Has the patient fasted?    Answer:   Yes  . Hemoglobin A1c  . Lipid panel    Order Specific Question:   Has the patient fasted?    Answer:   Yes  . TSH  . Ambulatory referral to Cardiology    Referral Priority:    Routine    Referral Type:   Consultation    Referral Reason:   Specialty Services Required    Requested Specialty:   Cardiology    Number of Visits Requested:   1  .  Care order/instruction:    Please recheck BP.  Marland Kitchen POCT urinalysis dipstick  . EKG 12-Lead   Meds ordered this encounter  Medications  . DISCONTD: amphetamine-dextroamphetamine (ADDERALL XR) 20 MG 24 hr capsule    Sig: Take 1 capsule (20 mg total) by mouth daily.    Dispense:  30 capsule    Refill:  0    May fill 60 days after signing  . amphetamine-dextroamphetamine (ADDERALL XR) 20 MG 24 hr capsule    Sig: Take 1 capsule (20 mg total) by mouth daily.    Dispense:  30 capsule    Refill:  0    May fill 30 days after signing  . amphetamine-dextroamphetamine (ADDERALL XR) 20 MG 24 hr capsule    Sig: Take 1 capsule (20 mg total) by mouth daily.    Dispense:  30 capsule    Refill:  0    Return in about 3 months (around 04/14/2017) for recheck ADHD.   Mikiah Demond Paulita Fujita, M.D. Primary Care at [facility] previously [facility] [address] [place], [place]  [address] [phone] phone [phone] fax

## 2017-01-16 LAB — CBC WITH DIFFERENTIAL/PLATELET
BASOS ABS: 0 10*3/uL (ref 0.0–0.2)
BASOS: 0 %
EOS (ABSOLUTE): 0.1 10*3/uL (ref 0.0–0.4)
Eos: 2 %
HEMOGLOBIN: 15.4 g/dL (ref 11.1–15.9)
Hematocrit: 45.9 % (ref 34.0–46.6)
IMMATURE GRANS (ABS): 0 10*3/uL (ref 0.0–0.1)
Immature Granulocytes: 0 %
LYMPHS ABS: 1.5 10*3/uL (ref 0.7–3.1)
LYMPHS: 32 %
MCH: 29.3 pg (ref 26.6–33.0)
MCHC: 33.6 g/dL (ref 31.5–35.7)
MCV: 87 fL (ref 79–97)
MONOCYTES: 7 %
Monocytes Absolute: 0.3 10*3/uL (ref 0.1–0.9)
NEUTROS ABS: 2.6 10*3/uL (ref 1.4–7.0)
Neutrophils: 59 %
Platelets: 284 10*3/uL (ref 150–379)
RBC: 5.26 x10E6/uL (ref 3.77–5.28)
RDW: 13.7 % (ref 12.3–15.4)
WBC: 4.6 10*3/uL (ref 3.4–10.8)

## 2017-01-16 LAB — LIPID PANEL
CHOL/HDL RATIO: 3.7 ratio (ref 0.0–4.4)
Cholesterol, Total: 246 mg/dL — ABNORMAL HIGH (ref 100–199)
HDL: 66 mg/dL (ref >39)
LDL CALC: 156 mg/dL — AB (ref 0–99)
TRIGLYCERIDES: 122 mg/dL (ref 0–149)
VLDL Cholesterol Cal: 24 mg/dL (ref 5–40)

## 2017-01-16 LAB — COMPREHENSIVE METABOLIC PANEL
A/G RATIO: 2 (ref 1.2–2.2)
ALBUMIN: 5.1 g/dL — AB (ref 3.6–4.8)
ALK PHOS: 37 IU/L — AB (ref 39–117)
ALT: 16 IU/L (ref 0–32)
AST: 17 IU/L (ref 0–40)
BILIRUBIN TOTAL: 0.7 mg/dL (ref 0.0–1.2)
BUN / CREAT RATIO: 22 (ref 12–28)
BUN: 16 mg/dL (ref 8–27)
CHLORIDE: 102 mmol/L (ref 96–106)
CO2: 23 mmol/L (ref 18–29)
Calcium: 9.6 mg/dL (ref 8.7–10.3)
Creatinine, Ser: 0.72 mg/dL (ref 0.57–1.00)
GFR calc non Af Amer: 91 mL/min/{1.73_m2} (ref >59)
GFR, EST AFRICAN AMERICAN: 105 mL/min/{1.73_m2} (ref >59)
GLOBULIN, TOTAL: 2.6 g/dL (ref 1.5–4.5)
GLUCOSE: 89 mg/dL (ref 65–99)
POTASSIUM: 4.8 mmol/L (ref 3.5–5.2)
SODIUM: 142 mmol/L (ref 134–144)
TOTAL PROTEIN: 7.7 g/dL (ref 6.0–8.5)

## 2017-01-16 LAB — TSH: TSH: 2.83 u[IU]/mL (ref 0.450–4.500)

## 2017-01-16 LAB — HEMOGLOBIN A1C
Est. average glucose Bld gHb Est-mCnc: 100 mg/dL
HEMOGLOBIN A1C: 5.1 % (ref 4.8–5.6)

## 2017-02-04 ENCOUNTER — Ambulatory Visit: Payer: BLUE CROSS/BLUE SHIELD | Admitting: Cardiology

## 2017-02-20 ENCOUNTER — Encounter: Payer: Self-pay | Admitting: Cardiology

## 2017-02-20 ENCOUNTER — Ambulatory Visit (INDEPENDENT_AMBULATORY_CARE_PROVIDER_SITE_OTHER): Payer: BLUE CROSS/BLUE SHIELD | Admitting: Cardiology

## 2017-02-20 VITALS — BP 148/78 | HR 65 | Ht 64.0 in | Wt 155.4 lb

## 2017-02-20 DIAGNOSIS — E785 Hyperlipidemia, unspecified: Secondary | ICD-10-CM

## 2017-02-20 DIAGNOSIS — R079 Chest pain, unspecified: Secondary | ICD-10-CM

## 2017-02-20 DIAGNOSIS — Z8249 Family history of ischemic heart disease and other diseases of the circulatory system: Secondary | ICD-10-CM | POA: Diagnosis not present

## 2017-02-20 DIAGNOSIS — K219 Gastro-esophageal reflux disease without esophagitis: Secondary | ICD-10-CM | POA: Diagnosis not present

## 2017-02-20 NOTE — Patient Instructions (Signed)
Medication Instructions: Your physician recommends that you continue on your current medications as directed. Please refer to the Current Medication list given to you today.   Testing/Procedures: Your physician has requested that you have an exercise tolerance test. For further information please visit [email or web address] Please also follow instruction sheet, as given.  Follow-Up: Your physician recommends that you schedule a follow-up appointment after testing with Dr. Herbie BaltimoreHarding.   Any Other Special Instructions will be listed below:   Exercise Stress Electrocardiogram An exercise stress electrocardiogram is a test to check how blood flows to your heart. It is done to find areas of poor blood flow. You will need to walk on a treadmill for this test. The electrocardiogram will record your heartbeat when you are at rest and when you are exercising. What happens before the procedure?  Do not have drinks with caffeine or foods with caffeine for 24 hours before the test, or as told by your doctor. This includes coffee, tea (even decaf tea), sodas, chocolate, and cocoa.  Follow your doctor's instructions about eating and drinking before the test.  Ask your doctor what medicines you should or should not take before the test. Take your medicines with water unless told by your doctor not to.  If you use an inhaler, bring it with you to the test.  Bring a snack to eat after the test.  Do not  smoke for 4 hours before the test.  Do not put lotions, powders, creams, or oils on your chest before the test.  Wear comfortable shoes and clothing. What happens during the procedure?  You will have patches put on your chest. Small areas of your chest may need to be shaved. Wires will be connected to the patches.  Your heart rate will be watched while you are resting and while you are exercising.  You will walk on the treadmill. The treadmill will slowly get faster to raise your heart rate.  The  test will take about 1-2 hours. What happens after the procedure?  Your heart rate and blood pressure will be watched after the test.  You may return to your normal diet, activities, and medicines or as told by your doctor. This information is not intended to replace advice given to you by your health care provider. Make sure you discuss any questions you have with your health care provider. Document Released: 05/06/2008 Document Revised: 07/17/2016 Document Reviewed: 07/26/2013 Elsevier Interactive Patient Education  2017 [employer]    If you need a refill on your cardiac medications before your next appointment, please call your pharmacy.

## 2017-02-20 NOTE — Progress Notes (Signed)
PCP: SMITH,KRISTI, MD  Clinic Note: Chief Complaint  Patient presents with  . New Patient (Initial Visit)    CP evaluation  . Shortness of Breath    occasionally.    HPI: [name] is a 61 y.o. female with a PMH below who presents today for Emergency room follow-up after presenting with chest pain in October 2017.  [name] was referred by his PCP. Last seen there on 01/15/2017  Recent Hospitalizations:  ER 09/02/2016: ER - felt like she had a viral infection & boil --> noted some upper arm/lateral chest discomfort.  ? Abnormal EKG -- r/o MI  Studies Reviewed: none  Interval History: [name] presents for CP evaluation.  She says that she's been having symptoms off and on for several months now where she been having intermittent occasional episodes of chest pain is pinching sensations. She also occasionally note some shortness of breath with walking around doing things. Not all the time for either of these 2 things. She also has occasional "racing heart sensation "but not prolonged" to suggest an arrhythmia. Episode that actually had her sent to the ER was when she went to go see her PCP for something totally different she noticed having some discomfort in her left chest that felt like a cramp radiating along the left side up yet midaxillary line. She described it as a sharp type pain but also tightness that symptom. She has occasional exertional dyspnea, but not most the time. She'll oftentimes notice some discomfort if she lies down on certain side for prolonged period time where she has some discomfort in her chest. However at work, she is able to go up and down steps maybe 5 or 6 flights at a time without any significant problems. No real symptoms without that.  She denies any PND, orthopnea or edema. No melena, hematochezia or hematuria. No claudication.  ROS: A comprehensive was performed. Review of Systems  Constitutional: Negative for malaise/fatigue.  HENT:  Negative for congestion and nosebleeds.   Respiratory: Negative for cough and sputum production.   Cardiovascular:       Per history of present illness  Musculoskeletal: Negative for falls and joint pain.  Neurological: Negative for dizziness.  Psychiatric/Behavioral: Negative for memory loss.  All other systems reviewed and are negative.   Past Medical History:  Diagnosis Date  . ADHD (attention deficit hyperactivity disorder)   . Anxiety   . Arthritis    "in my neck"  . Diverticulitis large intestine   . Frequency   . GERD (gastroesophageal reflux disease)   . H/O hiatal hernia   . Hepatitis C   . Insomnia   . 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 Urination, excessive at night     Past Surgical History:  Procedure Laterality Date  . ABDOMINAL SURGERY  ~ 2006   "got toothpick out of intestines"  . APPENDECTOMY  2011  . CHOLECYSTECTOMY  2006  . COLECTOMY  2011   subtotal   . DIAGNOSTIC LAPAROSCOPY  08/04/2012   LOA; VHR w/mesh  . ECTOPIC PREGNANCY SURGERY  1982   "twins"  . HERNIA REPAIR  2011  . LEG TENDON SURGERY  ~ 2005   left; "still have a bolt in it"  . ureathal cyst    . VAGINAL HYSTERECTOMY  1990's   enodmetriosis; ectopic pregnancies x 2; ovaries resected.  Marland Kitchen VENTRAL HERNIA REPAIR  08/04/2012   Procedure: LAPAROSCOPIC VENTRAL HERNIA;  Surgeon: Jim Desanctis  Rosenbower, MD;  Location: [facility] OR;  Service: General;  Laterality: N/A;  laparoscopic possible open ventral hernia repair with mesh    Current Meds  Medication Sig  . amphetamine-dextroamphetamine (ADDERALL XR) 20 MG 24 hr capsule Take 1 capsule (20 mg total) by mouth daily.    No Known Allergies  Social History   Social History  . Marital status: Divorced    Spouse name: N/A  . Number of children: 2  . Years of education: N/A   Occupational History  . 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 Smoking status: Former Smoker     Years: 4.00    Types: Cigarettes    Quit date: 12/02/1994  . Smokeless tobacco: Never Used     Comment: 08/04/2012 "quit smoking cigarettes 15-20 yr ago; really a recreational thing"  . Alcohol use 2.4 oz/week    4 Glasses of wine per week  . Drug use: No  . Sexual activity: Yes    Birth control/ protection: Surgical   Other Topics Concern  . None   Social History Narrative   Marital status: divorced; dating seriously in 2018 for 25 years      Children:  2 children; 9 grandchildren; 7 gg.      Lives: with mother      Employment: Engineer, materialssecurity officer      Tobacco: none      Alcohol: none      Drugs: none      Exercise: job physically active      Sexual activity: yes;     family history includes Alcohol abuse in her father; Arthritis in her sister; Colon cancer in her maternal uncle; Heart disease in her mother; Heart disease (age of onset: 4055) in her father. She's not really sure family history.  Wt Readings from Last 3 Encounters:  02/20/17 70.5 kg (155 lb 6.4 oz)  01/15/17 69.4 kg (153 lb)  09/06/16 72.2 kg (159 lb 3.2 oz)    PHYSICAL EXAM BP (!) 148/78   Pulse 65   Ht 5\' 4"  (1.626 m)   Wt 70.5 kg (155 lb 6.4 oz)   BMI 26.67 kg/m  General appearance: alert, cooperative, appears stated age, no distress and Well-nourished/well-groomed. HEENT: [place]/AT, EOMI, MMM, anicteric sclera Neck: no adenopathy, no carotid bruit and no JVD Lungs: clear to auscultation bilaterally, normal percussion bilaterally and non-labored Heart: regular rate and rhythm, S1 and S2 normal, no murmur, click, rub or gallop; nondisplaced PMI Abdomen: soft, non-tender; bowel sounds normal; no masses,  no organomegaly; no HJR Extremities: extremities normal, atraumatic, no cyanosis, or edema  Pulses: 2+ and symmetric;  Skin: mobility and turgor normal, no edema, no evidence of bleeding or bruising and no lesions noted  Neurologic: Mental status: Alert, oriented, thought content appropriate. Pleasant mood and  affect Cranial nerves: normal (II-XII grossly intact)    Adult ECG Report n/a  Other studies Reviewed: Additional studies/ records that were reviewed today include:  Recent Labs:   Lab Results  Component Value Date   CHOL 246 (H) 01/15/2017   HDL 66 01/15/2017   LDLCALC 156 (H) 01/15/2017   TRIG 122 01/15/2017   CHOLHDL 3.7 01/15/2017    ASSESSMENT / PLAN: Problem List Items Addressed This Visit    Chest pain with low risk for cardiac etiology - Primary (Chronic)    The very fact that she does not have the chest discomfort all the time, it is not associated with any particular  activity, along with the fact that she is able to walk up and down up to 5 flights of steps without difficulty makes her chest discomfort very low likelihood to be cardiac in nature. We will check a standard graded exercise treadmill stress test.      Relevant Orders   EXERCISE TOLERANCE TEST   Dyslipidemia, goal LDL below 130 (Chronic)    Current LDL is 156, with her family history alone, I think we should probably shoot for lower level. Recommend diet and exercise first, but would have a low threshold to consider treatment if it does not improve.      Esophageal reflux    Quite possible that her symptoms are related to GI issues.  We will attempt to exclude coronary ischemia with a GXT      Family history of premature CAD (Chronic)    To complete the chest pain evaluation, a GXT is warranted.      Relevant Orders   EXERCISE TOLERANCE TEST      Current medicines are reviewed at length with the patient today. (+/- concerns) n/a The following changes have been made: n/a  Patient Instructions  Medication Instructions: Your physician recommends that you continue on your current medications as directed. Please refer to the Current Medication list given to you today.   Testing/Procedures: Your physician has requested that you have an exercise tolerance test. For further information please  visit [email or web address] Please also follow instruction sheet, as given.  Follow-Up: Your physician recommends that you schedule a follow-up appointment after testing with Dr. Herbie Baltimore.  Studies Ordered:   Orders Placed This Encounter  Procedures  . EXERCISE TOLERANCE TEST      Bryan Lemma, M.D., M.S. Interventional Cardiologist   Pager # [phone] Phone # [phone] [address] [place], [place] [address]

## 2017-02-22 ENCOUNTER — Encounter: Payer: Self-pay | Admitting: Cardiology

## 2017-02-22 DIAGNOSIS — E785 Hyperlipidemia, unspecified: Secondary | ICD-10-CM | POA: Insufficient documentation

## 2017-02-22 NOTE — Assessment & Plan Note (Signed)
Current LDL is 156, with her family history alone, I think we should probably shoot for lower level. Recommend diet and exercise first, but would have a low threshold to consider treatment if it does not improve.

## 2017-02-22 NOTE — Assessment & Plan Note (Signed)
The very fact that she does not have the chest discomfort all the time, it is not associated with any particular activity, along with the fact that she is able to walk up and down up to 5 flights of steps without difficulty makes her chest discomfort very low likelihood to be cardiac in nature. We will check a standard graded exercise treadmill stress test.

## 2017-02-22 NOTE — Assessment & Plan Note (Signed)
To complete the chest pain evaluation, a GXT is warranted.

## 2017-02-22 NOTE — Assessment & Plan Note (Signed)
Quite possible that her symptoms are related to GI issues.  We will attempt to exclude coronary ischemia with a GXT

## 2017-02-25 ENCOUNTER — Telehealth (HOSPITAL_COMMUNITY): Payer: Self-pay | Admitting: Cardiology

## 2017-02-25 NOTE — Telephone Encounter (Signed)
Called pt to get her scheduled for an ETT and she voiced that she will not be having this done. She voiced that she was sent to the ER for a false diagnosis , the EKG in the office was hooked up wrong which gave her to the wrong diagnosis. She will be removed from the workqueue.

## 2017-04-15 ENCOUNTER — Ambulatory Visit: Payer: BLUE CROSS/BLUE SHIELD | Admitting: Family Medicine

## 2017-04-23 ENCOUNTER — Emergency Department (HOSPITAL_COMMUNITY)
Admission: EM | Admit: 2017-04-23 | Discharge: 2017-04-23 | Disposition: A | Discharge status: Home / Self Care | Payer: BLUE CROSS/BLUE SHIELD | Attending: Emergency Medicine | Admitting: Emergency Medicine

## 2017-04-23 ENCOUNTER — Encounter (HOSPITAL_COMMUNITY): Payer: Self-pay | Admitting: Emergency Medicine

## 2017-04-23 DIAGNOSIS — R220 Localized swelling, mass and lump, head: Secondary | ICD-10-CM | POA: Diagnosis present

## 2017-04-23 DIAGNOSIS — L03211 Cellulitis of face: Secondary | ICD-10-CM | POA: Diagnosis not present

## 2017-04-23 DIAGNOSIS — Z87891 Personal history of nicotine dependence: Secondary | ICD-10-CM | POA: Diagnosis not present

## 2017-04-23 DIAGNOSIS — F909 Attention-deficit hyperactivity disorder, unspecified type: Secondary | ICD-10-CM | POA: Diagnosis not present

## 2017-04-23 MED ORDER — DOXYCYCLINE HYCLATE 100 MG PO CAPS: 100.0000 mg | ORAL_CAPSULE | Freq: Two times a day (BID) | ORAL | 0 refills | Status: DC

## 2017-04-23 MED ORDER — ONDANSETRON HCL 4 MG PO TABS: 4.0000 mg | ORAL_TABLET | Freq: Three times a day (TID) | ORAL | 0 refills | Status: DC | PRN

## 2017-04-23 MED ORDER — FLUORESCEIN SODIUM 0.6 MG OP STRP
1.0000 | ORAL_STRIP | Freq: Once | OPHTHALMIC | Status: AC
  Administered 2017-04-23: 1 via OPHTHALMIC
  Filled 2017-04-23: qty 1

## 2017-04-23 MED ORDER — TETRACAINE HCL 0.5 % OP SOLN
2.0000 [drp] | Freq: Once | OPHTHALMIC | Status: AC
  Administered 2017-04-23: 2 [drp] via OPHTHALMIC
  Filled 2017-04-23: qty 2

## 2017-04-23 MED ORDER — ACETAMINOPHEN 325 MG PO TABS
650.0000 mg | ORAL_TABLET | Freq: Once | ORAL | Status: AC
  Administered 2017-04-23: 650 mg via ORAL
  Filled 2017-04-23: qty 2

## 2017-04-23 NOTE — ED Triage Notes (Signed)
Pt with redness and swelling to right eye and side of head starting yesterday; pt sts nausea with swelling

## 2017-04-23 NOTE — ED Provider Notes (Signed)
[facility]-EMERGENCY DEPT Provider Note   CSN: [id number] Arrival date & time: 04/23/17  1227     History   Chief Complaint Chief Complaint  Patient presents with  . Facial Swelling    HPI [name] is a 61 y.o. female.  HPI   61 year old female with history of hepatitis C, ADHD, anxiety presenting for evaluation of facial pain. Patient works as a Office managersecurity, third shift. 2 nights ago while walking she felt something may have bitten her in the face. States that she has been seen and killing several spiders near work station. The next day she notice increasing pain and swelling and burning sensation to the right side of the face. Severity is moderate, she feels weak, and anxious. She went to see an urgent care provider for her symptoms earlier today and was told that she may have shingle, recommended her to be seen in the ER. Patient denies prior history of shingle. She did have chickenpox in the past. She denies any fever, vision changes, pain with eye movement, neck stiffness, or joint pain.  Past Medical History:  Diagnosis Date  . ADHD (attention deficit hyperactivity disorder)   . Anxiety   . Arthritis    "in my neck"  . Diverticulitis large intestine   . Frequency   . GERD (gastroesophageal reflux disease)   . H/O hiatal hernia   . Hepatitis C   . Insomnia   . 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 Urination, excessive at night     Patient Active Problem List   Diagnosis Date Noted  . Dyslipidemia, goal LDL below 130 02/22/2017  . Chest pain with low risk for cardiac etiology 02/20/2017  . Family history of premature CAD 02/20/2017  . Esophageal reflux 03/02/2016  . History of hepatitis C 02/26/2015  . H/O colectomy 02/26/2015  . Stricture and stenosis of esophagus 05/22/2012  . Recurrent ventral incisional hernia s/p lap repair with mesh 9/3. 05/20/2012  . ADD 02/28/2010  . DIVERTICULITIS, HX OF 02/28/2010  . OTHER  ANXIETY STATES 10/02/2009    Past Surgical History:  Procedure Laterality Date  . ABDOMINAL SURGERY  ~ 2006   "got toothpick out of intestines"  . APPENDECTOMY  2011  . CHOLECYSTECTOMY  2006  . COLECTOMY  2011   subtotal   . DIAGNOSTIC LAPAROSCOPY  08/04/2012   LOA; VHR w/mesh  . ECTOPIC PREGNANCY SURGERY  1982   "twins"  . HERNIA REPAIR  2011  . LEG TENDON SURGERY  ~ 2005   left; "still have a bolt in it"  . ureathal cyst    . VAGINAL HYSTERECTOMY  1990's   enodmetriosis; ectopic pregnancies x 2; ovaries resected.  Marland Kitchen. VENTRAL HERNIA REPAIR  08/04/2012   Procedure: LAPAROSCOPIC VENTRAL HERNIA;  Surgeon: Adolph Pollackodd J Rosenbower, MD;  Location: [facility] OR;  Service: General;  Laterality: N/A;  laparoscopic possible open ventral hernia repair with mesh    OB History    No data available       Home Medications    Prior to Admission medications   Medication Sig Start Date End Date Taking? Authorizing Provider  amphetamine-dextroamphetamine (ADDERALL XR) 20 MG 24 hr capsule Take 1 capsule (20 mg total) by mouth daily. 09/04/16   Ethelda ChickSmith, Kristi M, MD    Family History Family History  Problem Relation Age of Onset  . Heart disease Mother   . Heart disease Father [id number]       CAD/PTCA  .  Alcohol abuse Father   . Arthritis Sister   . Colon cancer Maternal Uncle   . Colon polyps Neg Hx   . Prostate cancer Neg Hx   . Rectal cancer Neg Hx     Social History Social History  Substance Use Topics  . Smoking status: Former Smoker    Years: 4.00    Types: Cigarettes    Quit date: 12/02/1994  . Smokeless tobacco: Never Used     Comment: 08/04/2012 "quit smoking cigarettes 15-20 yr ago; really a recreational thing"  . Alcohol use 2.4 oz/week    4 Glasses of wine per week     Allergies   Patient has no known allergies.   Review of Systems Review of Systems  All other systems reviewed and are negative.    Physical Exam Updated Vital Signs BP (!) 154/84   Pulse 85   Temp 98.5 F (36.9  C) (Oral)   Resp 20   SpO2 100%   Physical Exam  Constitutional: She is oriented to person, place, and time. She appears well-developed and well-nourished.  Nontoxic in appearance however appears anxious  HENT:  Head: Atraumatic.  An area of induration, erythema, and tenderness noted to right temporal region measuring approximately 3 cm in diameter. There is a small scab noted but no blisters. A smaller area of induration and erythema noted to the right zygomatic arch.   Eyes: Conjunctivae, EOM and lids are normal. Pupils are equal, round, and reactive to light. Lids are everted and swept, no foreign bodies found. Right eye exhibits no chemosis, no discharge, no exudate and no hordeolum. No foreign body present in the right eye. Right conjunctiva is not injected. Right conjunctiva has no hemorrhage. No scleral icterus.  Slit lamp exam:      The right eye shows no corneal abrasion, no corneal flare, no corneal ulcer, no foreign body, no hyphema, no hypopyon and no fluorescein uptake.  Neck: Neck supple.  No nuchal rigidity  Cardiovascular: Normal rate and regular rhythm.   Pulmonary/Chest: Effort normal and breath sounds normal.  Abdominal: Soft. She exhibits no distension. There is no tenderness.  Neurological: She is alert and oriented to person, place, and time.  Skin: No rash noted.  Psychiatric: She has a normal mood and affect.  Nursing note and vitals reviewed.    ED Treatments / Results  Labs (all labs ordered are listed, but only abnormal results are displayed) Labs Reviewed - No data to display  EKG  EKG Interpretation None       Radiology No results found.  Procedures Procedures (including critical care time)   Visual Acuity Visual Acuity  Bilateral Distance: 10/12.5 R Distance: 10/16  L Distance: 10/12.5        Medications Ordered in ED Medications - No data to display   Initial Impression / Assessment and Plan / ED Course  I have reviewed the  triage vital signs and the nursing notes.  Pertinent labs & imaging results that were available during my care of the patient were reviewed by me and considered in my medical decision making (see chart for details).     BP (!) 163/89   Pulse 82   Temp 98.5 F (36.9 C) (Oral)   Resp 20   SpO2 96%   The patient was noted to be hypertensive today in the emergency department. I have spoken with the patient regarding hypertension and the need for improved management. I instructed the patient to followup with the Primary  care doctor within 4 days to improve the management of the patient's hypertension. I also counseled the patient regarding the signs and symptoms which would require an emergent visit to an emergency department for hypertensive urgency and/or hypertensive emergency. The patient understood the need for improved hypertensive management.   Final Clinical Impressions(s) / ED Diagnoses   Final diagnoses:  Facial cellulitis    New Prescriptions New Prescriptions   DOXYCYCLINE (VIBRAMYCIN) 100 MG CAPSULE    Take 1 capsule (100 mg total) by mouth 2 (two) times daily. One po bid x 7 days   ONDANSETRON (ZOFRAN) 4 MG TABLET    Take 1 tablet (4 mg total) by mouth every 8 (eight) hours as needed for nausea or vomiting.   4:59 PM Patient here with pain and swelling and redness to the right side of the face suggestive of a preseptal cellulitis. Doubt orbital cellulitis. She initially was concern for shingles. No obvious blisters to suggest shingle at this time however this could be early shingle. We'll perform eye examination. No Hutchinson sign.  5:30 PM Normal visual acuity. No fluorecein uptake on exam. No pain with eye movement. Low suspicion for herpetic involvement of the eye. At this time, will treat patient's preseptal cellulitis with doxycycline. We'll also prescribe Tylenol and Zofran. Work note provided. Patient made aware if she noticed formation of blisters to return if her  condition worsens. Recommend recheck in 48 hrs.  She is stable for discharge.   Fayrene Helper, PA-C 04/23/17 1737    Loren Racer, MD 04/24/17 [phone]

## 2017-04-23 NOTE — ED Notes (Signed)
Got patient into a gown on the monitor patient is resting 

## 2017-04-23 NOTE — Discharge Instructions (Signed)
You have been diagnosed with facial cellulitis; infection of the skin on your face.  Please take antibiotic as prescribe.  Return in 48 hrs for recheck if you notice no improvement.  If you notice formation of blisters to the affected area, then return sooner as it may be caused be shingle.

## 2017-06-13 ENCOUNTER — Ambulatory Visit: Payer: BLUE CROSS/BLUE SHIELD | Admitting: Family Medicine

## 2017-10-03 ENCOUNTER — Encounter: Payer: Self-pay | Admitting: Family Medicine

## 2017-10-03 ENCOUNTER — Ambulatory Visit (INDEPENDENT_AMBULATORY_CARE_PROVIDER_SITE_OTHER): Payer: BLUE CROSS/BLUE SHIELD | Admitting: Family Medicine

## 2017-10-03 VITALS — BP 138/78 | HR 84 | Temp 98.0°F | Resp 16 | Ht 65.16 in | Wt 160.0 lb

## 2017-10-03 DIAGNOSIS — F5104 Psychophysiologic insomnia: Secondary | ICD-10-CM | POA: Diagnosis not present

## 2017-10-03 DIAGNOSIS — R109 Unspecified abdominal pain: Secondary | ICD-10-CM

## 2017-10-03 DIAGNOSIS — F901 Attention-deficit hyperactivity disorder, predominantly hyperactive type: Secondary | ICD-10-CM

## 2017-10-03 DIAGNOSIS — Z9049 Acquired absence of other specified parts of digestive tract: Secondary | ICD-10-CM

## 2017-10-03 DIAGNOSIS — F411 Generalized anxiety disorder: Secondary | ICD-10-CM

## 2017-10-03 DIAGNOSIS — K432 Incisional hernia without obstruction or gangrene: Secondary | ICD-10-CM | POA: Diagnosis not present

## 2017-10-03 LAB — POCT URINALYSIS DIP (MANUAL ENTRY)
Bilirubin, UA: NEGATIVE
Blood, UA: NEGATIVE
GLUCOSE UA: NEGATIVE mg/dL
Ketones, POC UA: NEGATIVE mg/dL
NITRITE UA: NEGATIVE
PROTEIN UA: NEGATIVE mg/dL
SPEC GRAV UA: 1.02 (ref 1.010–1.025)
UROBILINOGEN UA: 0.2 U/dL
pH, UA: 7.5 (ref 5.0–8.0)

## 2017-10-03 MED ORDER — QUETIAPINE FUMARATE 25 MG PO TABS
25.0000 mg | ORAL_TABLET | Freq: Every day | ORAL | 2 refills | Status: DC
Start: 2017-10-03 — End: 2017-12-22

## 2017-10-03 MED ORDER — AMPHETAMINE-DEXTROAMPHET ER 25 MG PO CP24: 25.0000 mg | ORAL_CAPSULE | ORAL | 0 refills | Status: DC

## 2017-10-03 MED ORDER — AMPHETAMINE-DEXTROAMPHET ER 20 MG PO CP24: 20.0000 mg | ORAL_CAPSULE | Freq: Every day | ORAL | 0 refills | Status: DC

## 2017-10-03 NOTE — Progress Notes (Signed)
Subjective:    Patient ID: [name], female    DOB: 04-01-1956, 61 y.o.   MRN: [id number]  10/03/2017  ADD (follow-up pt states she would like to restart  adderall) and Flank Pain    HPI This 61 y.o. female presents for follow-up ADHD. Has been off medication since May 2018.  Di d not call in May for three months of rx.  Major stressors since last visit and has been very busy.  Inherited a 61 year old type 1 DM. Working 7a-7p. So exhausted; cannot make self sit down.  [name] is huge; moved out to [place]; gets off work and works in yard until dark.  Last night, cannot sleep again.  Stressed really badly.   Has taken pm stuff but feels bad the next day.   Gets so tired but still cannot rest. The extra with the granddaughter; emotional.  Will drink glass of wine in bath at night but cannot settle down at night right now.   Granddaughter is sick and pt is really worried about her. Dr. Corinda [name] described as anxious; prescribed anxiety medication.    R sided rib pain; pain so striking, would take breath; then moved down to abdomen.  Thought might be mesh.  Onset months ago; intermittent; RUQ pian; very uncomfortable.  No associated n/v/d/c.  No associated dysuria, hematuria, urgency, or frequency.  No bulge.  Cannot slow down brain.  Whirlwind.  Wears off faster.  May need something stronger.   BP Readings from Last 3 Encounters:  10/03/17 138/78  04/23/17 (!) 163/89  02/20/17 (!) 148/78   Wt Readings from Last 3 Encounters:  10/03/17 160 lb (72.6 kg)  02/20/17 155 lb 6.4 oz (70.5 kg)  01/15/17 153 lb (69.4 kg)   Immunization History  Administered Date(s) Administered  . Influenza Split 09/01/2016, 10/02/2017  . Tdap 02/23/2015  . Zoster 03/17/2015    Review of Systems  Constitutional: Negative for chills, diaphoresis, fatigue and fever.  Eyes: Negative for visual disturbance.  Respiratory: Negative for cough and shortness of breath.   Cardiovascular: Negative for  chest pain, palpitations and leg swelling.  Gastrointestinal: Positive for abdominal pain. Negative for abdominal distention, anal bleeding, blood in stool, constipation, diarrhea, nausea and vomiting.  Endocrine: Negative for cold intolerance, heat intolerance, polydipsia, polyphagia and polyuria.  Genitourinary: Negative for dysuria, enuresis, flank pain, frequency, genital sores, hematuria, menstrual problem, pelvic pain and urgency.  Neurological: Negative for dizziness, tremors, seizures, syncope, facial asymmetry, speech difficulty, weakness, light-headedness, numbness and headaches.  Psychiatric/Behavioral: Positive for decreased concentration and sleep disturbance. Negative for self-injury. The patient is nervous/anxious.     Past Medical History:  Diagnosis Date  . ADHD (attention deficit hyperactivity disorder)   . Anxiety   . Arthritis    "in my neck"  . Diverticulitis large intestine   . Frequency   . GERD (gastroesophageal reflux disease)   . H/O hiatal hernia   . Hepatitis C   . Insomnia   . 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 Urination, excessive at night    Past Surgical History:  Procedure Laterality Date  . ABDOMINAL SURGERY  ~ 2006   "got toothpick out of intestines"  . APPENDECTOMY  2011  . CHOLECYSTECTOMY  2006  . COLECTOMY  2011   subtotal   . DIAGNOSTIC LAPAROSCOPY  08/04/2012   LOA; VHR w/mesh  . ECTOPIC PREGNANCY SURGERY  1982   "twins"  . HERNIA REPAIR  2011  .  INSERTION OF MESH N/A 08/04/2012   [facility]  . LAPAROSCOPIC VENTRAL HERNIA N/A 08/04/2012   Performed by Adolph Pollack, MD at [facility] OR  . LEG TENDON SURGERY  ~ 2005   left; "still have a bolt in it"  . ureathal cyst    . VAGINAL HYSTERECTOMY  1990's   enodmetriosis; ectopic pregnancies x 2; ovaries resected.   No Known Allergies No current outpatient medications on file prior to visit.   No current  facility-administered medications on file prior to visit.    Social History   Socioeconomic History  . Marital status: Divorced    Spouse name: Not on file  . Number of children: 2  . Years of education: Not on file  . Highest education level: Not on file  Social Needs  . Financial resource strain: Not on file  . Food insecurity - worry: Not on file  . Food insecurity - inability: Not on file  . Transportation needs - medical: Not on file  . Transportation needs - non-medical: Not on file  Occupational History  . Occupation: Event organiser: [employer]  Tobacco Use  . Smoking status: Former Smoker    Years: 4.00    Types: Cigarettes    Last attempt to quit: 12/02/1994    Years since quitting: 22.8  . Smokeless tobacco: Never Used  . Tobacco comment: 08/04/2012 "quit smoking cigarettes 15-20 yr ago; really a recreational thing"  Substance and Sexual Activity  . Alcohol use: Yes    Alcohol/week: 2.4 oz    Types: 4 Glasses of wine per week  . Drug use: No  . Sexual activity: Yes    Birth control/protection: Surgical  Other Topics Concern  . Not on file  Social History Narrative   Marital status: divorced; dating seriously in 2018 for 25 years      Children:  2 children; 9 grandchildren; 7 gg.      Lives: with mother      Employment: Engineer, materials      Tobacco: none      Alcohol: none      Drugs: none      Exercise: job physically active      Sexual activity: yes;    Family History  Problem Relation Age of Onset  . Heart disease Mother   . Heart disease Father 42       CAD/PTCA  . Alcohol abuse Father   . Arthritis Sister   . Colon cancer Maternal Uncle   . Colon polyps Neg Hx   . Prostate cancer Neg Hx   . Rectal cancer Neg Hx        Objective:    BP 138/78   Pulse 84   Temp 98 F (36.7 C) (Oral)   Resp 16   Ht 5' 5.16" (1.655 m)   Wt 160 lb (72.6 kg)   SpO2 98%   BMI 26.50 kg/m  Physical Exam  Constitutional: She is  oriented to person, place, and time. She appears well-developed and well-nourished. No distress.  HENT:  Head: Normocephalic and atraumatic.  Right Ear: External ear normal.  Left Ear: External ear normal.  Nose: Nose normal.  Mouth/Throat: Oropharynx is clear and moist.  Eyes: Pupils are equal, round, and reactive to light. Conjunctivae and EOM are normal.  Neck: Normal range of motion. Neck supple. Carotid bruit is not present. No thyromegaly present.  Cardiovascular: Normal rate,  regular rhythm, normal heart sounds and intact distal pulses.  Exam reveals no gallop and no friction rub.   No murmur heard. Pulmonary/Chest: Effort normal and breath sounds normal. She has no wheezes. She has no rales.  Abdominal: Soft. Bowel sounds are normal. She exhibits no distension and no mass. There is no tenderness. There is no rebound, no guarding and no CVA tenderness. No hernia.  Musculoskeletal:       Lumbar back: Normal. She exhibits normal range of motion, no tenderness, no bony tenderness, no swelling and no edema.  Lymphadenopathy:    She has no cervical adenopathy.  Neurological: She is alert and oriented to person, place, and time. No cranial nerve deficit.  Skin: Skin is warm and dry. No rash noted. She is not diaphoretic. No erythema. No pallor.  Psychiatric: Her behavior is normal. Judgment and thought content normal. Her mood appears anxious. Her speech is rapid and/or pressured. Cognition and memory are normal.   No results found. Depression screen [facility] 2/9 10/03/2017 01/15/2017 09/06/2016 09/04/2016 09/02/2016  Decreased Interest 0 0 0 0 0  Down, Depressed, Hopeless 0 0 0 0 0  PHQ - 2 Score 0 0 0 0 0   Fall Risk  10/03/2017 01/15/2017 09/06/2016 09/04/2016 09/02/2016  Falls in the past year? No No No No No        Assessment & Plan:   1. Attention deficit hyperactivity disorder (ADHD), predominantly hyperactive type   2. Flank pain   3. H/O colectomy   4. Recurrent ventral incisional  hernia s/p lap repair with mesh 9/3.   5. Psychophysiological insomnia   6. Generalized anxiety disorder     Uncontrolled attention deficit disorder with hyperactivity due to noncompliance with Adderall therapy.  Refill of Adderall provided starting at 20 mg for 1 month and then increasing to 25 mg subsequent month.  May warrant an increase to 30 mg at follow-up visit.  Close follow-up.  New onset insomnia due to major family stressors.  Prescription for Seroquel provided to take at bedtime.  May warrant an SSRI or S and RI for anxiety management if anxiety is not greatly improved with treatment of insomnia and ADHD.  New onset right upper quadrant pain and right flank pain.  History of colectomy and ventral incisional hernia repair.  Benign abdominal exam in office.  Benign lumbar spine exam in office.  Obtain labs including CBC, CMET, urinalysis.  Return to clinic for acute worsening.  No associated GI or GU symptoms.  Orders Placed This Encounter  Procedures  . CBC with Differential/Platelet  . Comprehensive metabolic panel  . T4, free  . TSH  . Urine Microscopic  . POCT urinalysis dipstick   Meds ordered this encounter  Medications  . amphetamine-dextroamphetamine (ADDERALL XR) 20 MG 24 hr capsule    Sig: Take 1 capsule (20 mg total) by mouth daily.    Dispense:  30 capsule    Refill:  0  . amphetamine-dextroamphetamine (ADDERALL XR) 25 MG 24 hr capsule    Sig: Take 1 capsule by mouth every morning.    Dispense:  32 capsule    Refill:  0    DO NOT FILL FOR 30 DAYS AFTER PRESCRIBED.  Marland Kitchen QUEtiapine (SEROQUEL) 25 MG tablet    Sig: Take 1 tablet (25 mg total) by mouth at bedtime.    Dispense:  30 tablet    Refill:  2    Return in about 2 months (around 12/03/2017) for recheck ADHD, insomnia.  Letta Cargile Paulita FujitaMartin Tyrelle Raczka, M.D. [facility] at [facility]  [place] [facility] [address] [place], [place]  [id number] [phone] phone [phone]  fax

## 2017-10-03 NOTE — Patient Instructions (Signed)
     IF you received an x-ray today, you will receive an invoice from [facility]. Please contact [facility] at [phone] with questions or concerns regarding your invoice.   IF you received labwork today, you will receive an invoice from [facility]. Please contact [facility] at [phone] with questions or concerns regarding your invoice.   Our billing staff will not be able to assist you with questions regarding bills from these companies.  You will be contacted with the lab results as soon as they are available. The fastest way to get your results is to activate your My Chart account. Instructions are located on the last page of this paperwork. If you have not heard from us regarding the results in 2 weeks, please contact this office.     

## 2017-10-04 LAB — CBC WITH DIFFERENTIAL/PLATELET
BASOS: 0 %
Basophils Absolute: 0 10*3/uL (ref 0.0–0.2)
EOS (ABSOLUTE): 0.2 10*3/uL (ref 0.0–0.4)
EOS: 3 %
HEMATOCRIT: 42.6 % (ref 34.0–46.6)
HEMOGLOBIN: 14.5 g/dL (ref 11.1–15.9)
IMMATURE GRANS (ABS): 0 10*3/uL (ref 0.0–0.1)
Immature Granulocytes: 0 %
Lymphocytes Absolute: 1.8 10*3/uL (ref 0.7–3.1)
Lymphs: 31 %
MCH: 30.5 pg (ref 26.6–33.0)
MCHC: 34 g/dL (ref 31.5–35.7)
MCV: 90 fL (ref 79–97)
Monocytes Absolute: 0.4 10*3/uL (ref 0.1–0.9)
Monocytes: 7 %
NEUTROS PCT: 59 %
Neutrophils Absolute: 3.4 10*3/uL (ref 1.4–7.0)
Platelets: 233 10*3/uL (ref 150–379)
RBC: 4.76 x10E6/uL (ref 3.77–5.28)
RDW: 13.4 % (ref 12.3–15.4)
WBC: 5.8 10*3/uL (ref 3.4–10.8)

## 2017-10-04 LAB — COMPREHENSIVE METABOLIC PANEL
A/G RATIO: 2 (ref 1.2–2.2)
ALBUMIN: 4.9 g/dL — AB (ref 3.6–4.8)
ALT: 18 IU/L (ref 0–32)
AST: 14 IU/L (ref 0–40)
Alkaline Phosphatase: 50 IU/L (ref 39–117)
BILIRUBIN TOTAL: 0.3 mg/dL (ref 0.0–1.2)
BUN / CREAT RATIO: 21 (ref 12–28)
BUN: 18 mg/dL (ref 8–27)
CALCIUM: 9.5 mg/dL (ref 8.7–10.3)
CHLORIDE: 107 mmol/L — AB (ref 96–106)
CO2: 21 mmol/L (ref 20–29)
Creatinine, Ser: 0.87 mg/dL (ref 0.57–1.00)
GFR, EST AFRICAN AMERICAN: 83 mL/min/{1.73_m2} (ref >59)
GFR, EST NON AFRICAN AMERICAN: 72 mL/min/{1.73_m2} (ref >59)
GLOBULIN, TOTAL: 2.5 g/dL (ref 1.5–4.5)
Glucose: 98 mg/dL (ref 65–99)
POTASSIUM: 4.4 mmol/L (ref 3.5–5.2)
Sodium: 146 mmol/L — ABNORMAL HIGH (ref 134–144)
TOTAL PROTEIN: 7.4 g/dL (ref 6.0–8.5)

## 2017-10-04 LAB — URINALYSIS, MICROSCOPIC ONLY
BACTERIA UA: NONE SEEN
Casts: NONE SEEN /lpf

## 2017-10-04 LAB — T4, FREE: Free T4: 1.07 ng/dL (ref 0.82–1.77)

## 2017-10-04 LAB — TSH: TSH: 3.38 u[IU]/mL (ref 0.450–4.500)

## 2017-11-26 ENCOUNTER — Ambulatory Visit: Payer: BLUE CROSS/BLUE SHIELD | Admitting: Family Medicine

## 2017-11-29 ENCOUNTER — Ambulatory Visit: Payer: BLUE CROSS/BLUE SHIELD | Admitting: Family Medicine

## 2017-12-08 ENCOUNTER — Ambulatory Visit: Payer: BLUE CROSS/BLUE SHIELD | Admitting: Family Medicine

## 2017-12-16 ENCOUNTER — Telehealth: Payer: Self-pay | Admitting: Family Medicine

## 2017-12-16 ENCOUNTER — Other Ambulatory Visit: Payer: Self-pay | Admitting: *Deleted

## 2017-12-16 DIAGNOSIS — F901 Attention-deficit hyperactivity disorder, predominantly hyperactive type: Secondary | ICD-10-CM

## 2017-12-16 NOTE — Telephone Encounter (Signed)
Pended prescription   Patient is scheduled for an appointment 01/2018

## 2017-12-16 NOTE — Telephone Encounter (Signed)
Copied from CRM [phone]. Topic: Quick Communication - Rx Refill/Question >> Dec 16, 2017  8:29 AM [name], [name] wrote: Medication: amphetamine-dextroamphetamine (ADDERALL XR) 25 MG 24 hr capsule Has the patient contacted their pharmacy? No (Agent: If no, request that the patient contact the pharmacy for the refill.) Preferred Pharmacy (with phone number or street name): [facility] [id number] - [place], [place] - [address] [facility] [address] Agent: Please be advised that RX refills may take up to 3 business days. We ask that you follow-up with your pharmacy. Patient would like refill on her meds. She has an appt on 02/04/18 for med f/u. Please call patient when is ready for pick-up or if it can be sent to her pharmacy, thanks.

## 2017-12-16 NOTE — Telephone Encounter (Signed)
OV:10/03/17  Routing to provider

## 2017-12-17 NOTE — Telephone Encounter (Signed)
Patient is calling back in regards to this and if this will be sent in. She is out of medication as of 12/18/17. Please advise.

## 2017-12-18 MED ORDER — AMPHETAMINE-DEXTROAMPHET ER 25 MG PO CP24: 25.0000 mg | ORAL_CAPSULE | ORAL | 0 refills | Status: DC

## 2017-12-18 NOTE — Telephone Encounter (Signed)
Call [facility] [address]

## 2017-12-18 NOTE — Telephone Encounter (Signed)
Left message that prescription has been sent

## 2017-12-22 ENCOUNTER — Other Ambulatory Visit: Payer: Self-pay | Admitting: Family Medicine

## 2017-12-22 NOTE — Telephone Encounter (Signed)
Request for refill

## 2018-01-25 ENCOUNTER — Other Ambulatory Visit: Payer: Self-pay | Admitting: Family Medicine

## 2018-02-04 ENCOUNTER — Ambulatory Visit: Payer: BLUE CROSS/BLUE SHIELD | Admitting: Family Medicine

## 2018-02-16 ENCOUNTER — Other Ambulatory Visit: Payer: Self-pay

## 2018-02-16 ENCOUNTER — Ambulatory Visit (INDEPENDENT_AMBULATORY_CARE_PROVIDER_SITE_OTHER): Payer: BLUE CROSS/BLUE SHIELD | Admitting: Family Medicine

## 2018-02-16 ENCOUNTER — Encounter: Payer: Self-pay | Admitting: Family Medicine

## 2018-02-16 VITALS — BP 130/70 | HR 80 | Temp 98.0°F | Resp 16 | Ht 64.96 in | Wt 145.0 lb

## 2018-02-16 DIAGNOSIS — F5105 Insomnia due to other mental disorder: Secondary | ICD-10-CM

## 2018-02-16 DIAGNOSIS — K432 Incisional hernia without obstruction or gangrene: Secondary | ICD-10-CM

## 2018-02-16 DIAGNOSIS — F99 Mental disorder, not otherwise specified: Secondary | ICD-10-CM | POA: Diagnosis not present

## 2018-02-16 DIAGNOSIS — Z131 Encounter for screening for diabetes mellitus: Secondary | ICD-10-CM

## 2018-02-16 DIAGNOSIS — Z13220 Encounter for screening for lipoid disorders: Secondary | ICD-10-CM

## 2018-02-16 DIAGNOSIS — Z8249 Family history of ischemic heart disease and other diseases of the circulatory system: Secondary | ICD-10-CM

## 2018-02-16 DIAGNOSIS — Z8619 Personal history of other infectious and parasitic diseases: Secondary | ICD-10-CM | POA: Diagnosis not present

## 2018-02-16 DIAGNOSIS — Z0000 Encounter for general adult medical examination without abnormal findings: Secondary | ICD-10-CM | POA: Diagnosis not present

## 2018-02-16 DIAGNOSIS — Z9049 Acquired absence of other specified parts of digestive tract: Secondary | ICD-10-CM | POA: Diagnosis not present

## 2018-02-16 DIAGNOSIS — F901 Attention-deficit hyperactivity disorder, predominantly hyperactive type: Secondary | ICD-10-CM | POA: Diagnosis not present

## 2018-02-16 LAB — POCT URINALYSIS DIP (MANUAL ENTRY)
BILIRUBIN UA: NEGATIVE
GLUCOSE UA: NEGATIVE mg/dL
Ketones, POC UA: NEGATIVE mg/dL
LEUKOCYTES UA: NEGATIVE
NITRITE UA: NEGATIVE
PH UA: 5.5 (ref 5.0–8.0)
Protein Ur, POC: NEGATIVE mg/dL
Spec Grav, UA: 1.02 (ref 1.010–1.025)
UROBILINOGEN UA: 0.2 U/dL

## 2018-02-16 MED ORDER — QUETIAPINE FUMARATE 25 MG PO TABS: ORAL_TABLET | ORAL | 5 refills | Status: DC

## 2018-02-16 MED ORDER — AMPHETAMINE-DEXTROAMPHET ER 25 MG PO CP24: 25.0000 mg | ORAL_CAPSULE | ORAL | 0 refills | Status: DC

## 2018-02-16 NOTE — Patient Instructions (Addendum)
IF you received an x-ray today, you will receive an invoice from [facility]. Please contact [facility] at [phone] with questions or concerns regarding your invoice.   IF you received labwork today, you will receive an invoice from [facility]. Please contact [facility] at [phone] with questions or concerns regarding your invoice.   Our billing staff will not be able to assist you with questions regarding bills from these companies.  You will be contacted with the lab results as soon as they are available. The fastest way to get your results is to activate your My Chart account. Instructions are located on the last page of this paperwork. If you have not heard from [place] regarding the results in 2 weeks, please contact this office.      Preventive Care 40-64 Years, Female Preventive care refers to lifestyle choices and visits with your health care provider that can promote health and wellness. What does preventive care include?  A yearly physical exam. This is also called an annual well check.  Dental exams once or twice a year.  Routine eye exams. Ask your health care provider how often you should have your eyes checked.  Personal lifestyle choices, including: ? Daily care of your teeth and gums. ? Regular physical activity. ? Eating a healthy diet. ? Avoiding tobacco and drug use. ? Limiting alcohol use. ? Practicing safe sex. ? Taking low-dose aspirin daily starting at age 34. ? Taking vitamin and mineral supplements as recommended by your health care provider. What happens during an annual well check? The services and screenings done by your health care provider during your annual well check will depend on your age, overall health, lifestyle risk factors, and family history of disease. Counseling Your health care provider may ask you questions about your:  Alcohol use.  Tobacco use.  Drug use.  Emotional well-being.  Home and relationship  well-being.  Sexual activity.  Eating habits.  Work and work Statistician.  Method of birth control.  Menstrual cycle.  Pregnancy history.  Screening You may have the following tests or measurements:  Height, weight, and BMI.  Blood pressure.  Lipid and cholesterol levels. These may be checked every 5 years, or more frequently if you are over 65 years old.  Skin check.  Lung cancer screening. You may have this screening every year starting at age 102 if you have a 30-pack-year history of smoking and currently smoke or have quit within the past 15 years.  Fecal occult blood test (FOBT) of the stool. You may have this test every year starting at age 33.  Flexible sigmoidoscopy or colonoscopy. You may have a sigmoidoscopy every 5 years or a colonoscopy every 10 years starting at age 23.  Hepatitis C blood test.  Hepatitis B blood test.  Sexually transmitted disease (STD) testing.  Diabetes screening. This is done by checking your blood sugar (glucose) after you have not eaten for a while (fasting). You may have this done every 1-3 years.  Mammogram. This may be done every 1-2 years. Talk to your health care provider about when you should start having regular mammograms. This may depend on whether you have a family history of breast cancer.  BRCA-related cancer screening. This may be done if you have a family history of breast, ovarian, tubal, or peritoneal cancers.  Pelvic exam and Pap test. This may be done every 3 years starting at age 3. Starting at age 55, this may be done every 5 years if  you have a Pap test in combination with an HPV test.  Bone density scan. This is done to screen for osteoporosis. You may have this scan if you are at high risk for osteoporosis.  Discuss your test results, treatment options, and if necessary, the need for more tests with your health care provider. Vaccines Your health care provider may recommend certain vaccines, such  as:  Influenza vaccine. This is recommended every year.  Tetanus, diphtheria, and acellular pertussis (Tdap, Td) vaccine. You may need a Td booster every 10 years.  Varicella vaccine. You may need this if you have not been vaccinated.  Zoster vaccine. You may need this after age 77.  Measles, mumps, and rubella (MMR) vaccine. You may need at least one dose of MMR if you were born in 1957 or later. You may also need a second dose.  Pneumococcal 13-valent conjugate (PCV13) vaccine. You may need this if you have certain conditions and were not previously vaccinated.  Pneumococcal polysaccharide (PPSV23) vaccine. You may need one or two doses if you smoke cigarettes or if you have certain conditions.  Meningococcal vaccine. You may need this if you have certain conditions.  Hepatitis A vaccine. You may need this if you have certain conditions or if you travel or work in places where you may be exposed to hepatitis A.  Hepatitis B vaccine. You may need this if you have certain conditions or if you travel or work in places where you may be exposed to hepatitis B.  Haemophilus influenzae type b (Hib) vaccine. You may need this if you have certain conditions.  Talk to your health care provider about which screenings and vaccines you need and how often you need them. This information is not intended to replace advice given to you by your health care provider. Make sure you discuss any questions you have with your health care provider. Document Released: 12/15/2015 Document Revised: 08/07/2016 Document Reviewed: 09/19/2015 Elsevier Interactive Patient Education  [employer].

## 2018-02-16 NOTE — Progress Notes (Signed)
Subjective:    Patient ID: [name], female    DOB: [birth year], 62 y.o.   MRN: [id number]  02/16/2018  Annual Exam    HPI This 62 y.o. female presents for Complete Physical Examination.  Last physical:  01-15-2017 Pap smear:  Hysterectomy. Mammogram:  2017 Colonoscopy:  2016 Eye exam:  Due; last visit 2018 Dental exam:  Due this year.   Visual Acuity Screening   Right eye Left eye Both eyes  Without correction:     With correction: 20/20 20/20 20/20     BP Readings from Last 3 Encounters:  02/16/18 130/70  10/03/17 138/78  04/23/17 (!) 163/89   Wt Readings from Last 3 Encounters:  02/16/18 145 lb (65.8 kg)  10/03/17 160 lb (72.6 kg)  02/20/17 155 lb 6.4 oz (70.5 kg)   Immunization History  Administered Date(s) Administered  . Influenza Split 09/01/2016, 10/02/2017  . Tdap 02/23/2015  . Zoster 03/17/2015   Health Maintenance  Topic Date Due  . MAMMOGRAM  03/27/2018  . INFLUENZA VACCINE  07/02/2018  . COLONOSCOPY  02/28/2020  . TETANUS/TDAP  02/22/2025  . Hepatitis C Screening  Completed  . HIV Screening  Completed  . PAP SMEAR  Discontinued    Review of Systems  Constitutional: Negative for activity change, appetite change, chills, diaphoresis, fatigue, fever and unexpected weight change.  HENT: Negative for congestion, dental problem, drooling, ear discharge, ear pain, facial swelling, hearing loss, mouth sores, nosebleeds, postnasal drip, rhinorrhea, sinus pressure, sneezing, sore throat, tinnitus, trouble swallowing and voice change.   Eyes: Negative for photophobia, pain, discharge, redness, itching and visual disturbance.  Respiratory: Negative for apnea, cough, choking, chest tightness, shortness of breath, wheezing and stridor.   Cardiovascular: Negative for chest pain, palpitations and leg swelling.  Gastrointestinal: Negative for abdominal distention, abdominal pain, anal bleeding, blood in stool, constipation, diarrhea, nausea, rectal pain  and vomiting.  Endocrine: Negative for cold intolerance, heat intolerance, polydipsia, polyphagia and polyuria.  Genitourinary: Negative for decreased urine volume, difficulty urinating, dyspareunia, dysuria, enuresis, flank pain, frequency, genital sores, hematuria, menstrual problem, pelvic pain, urgency, vaginal bleeding, vaginal discharge and vaginal pain.       Nocturia x 1.  No urinary leakage RARE/RUNNING/STRESS.  Musculoskeletal: Negative for arthralgias, back pain, gait problem, joint swelling, myalgias, neck pain and neck stiffness.  Skin: Negative for color change, pallor, rash and wound.  Allergic/Immunologic: Negative for environmental allergies, food allergies and immunocompromised state.  Neurological: Negative for dizziness, tremors, seizures, syncope, facial asymmetry, speech difficulty, weakness, light-headedness, numbness and headaches.  Hematological: Negative for adenopathy. Does not bruise/bleed easily.  Psychiatric/Behavioral: Positive for decreased concentration and sleep disturbance. Negative for agitation, behavioral problems, confusion, dysphoric mood, hallucinations, self-injury and suicidal ideas. The patient is nervous/anxious and is hyperactive.     Past Medical History:  Diagnosis Date  . ADHD (attention deficit hyperactivity disorder)   . Anxiety   . Arthritis    "in my neck"  . Diverticulitis large intestine   . Frequency   . GERD (gastroesophageal reflux disease)   . H/O hiatal hernia   . Hepatitis C   . Insomnia   . 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 Urination, excessive at night    Past Surgical History:  Procedure Laterality Date  . ABDOMINAL SURGERY  ~ 2006   "got toothpick out of intestines"  . APPENDECTOMY  2011  . CHOLECYSTECTOMY  2006  . COLECTOMY  2011   subtotal   .  DIAGNOSTIC LAPAROSCOPY  08/04/2012   LOA; VHR w/mesh  . ECTOPIC PREGNANCY SURGERY  1982   "twins"  . HERNIA REPAIR  2011  . LEG  TENDON SURGERY  ~ 2005   left; "still have a bolt in it"  . ureathal cyst    . VAGINAL HYSTERECTOMY  1990's   enodmetriosis; ectopic pregnancies x 2; ovaries resected.  Marland Kitchen VENTRAL HERNIA REPAIR  08/04/2012   Procedure: LAPAROSCOPIC VENTRAL HERNIA;  Surgeon: Adolph Pollack, MD;  Location: [facility] OR;  Service: General;  Laterality: N/A;  laparoscopic possible open ventral hernia repair with mesh   No Known Allergies No current outpatient medications on file prior to visit.   No current facility-administered medications on file prior to visit.    Social History   Socioeconomic History  . Marital status: Divorced    Spouse name: Not on file  . Number of children: 2  . Years of education: Not on file  . Highest education level: Not on file  Occupational History  . Occupation: Event organiser: [employer]  Social Needs  . Financial resource strain: Not on file  . Food insecurity:    Worry: Not on file    Inability: Not on file  . Transportation needs:    Medical: Not on file    Non-medical: Not on file  Tobacco Use  . Smoking status: Former Smoker    Years: 4.00    Types: Cigarettes    Last attempt to quit: 12/02/1994    Years since quitting: 23.3  . Smokeless tobacco: Never Used  . Tobacco comment: 08/04/2012 "quit smoking cigarettes 15-20 yr ago; really a recreational thing"  Substance and Sexual Activity  . Alcohol use: Yes    Alcohol/week: 2.4 oz    Types: 4 Glasses of wine per week  . Drug use: No  . Sexual activity: Yes    Birth control/protection: Surgical, Post-menopausal  Lifestyle  . Physical activity:    Days per week: Not on file    Minutes per session: Not on file  . Stress: Not on file  Relationships  . Social connections:    Talks on phone: Not on file    Gets together: Not on file    Attends religious service: Not on file    Active member of club or organization: Not on file    Attends meetings of clubs or organizations: Not on  file    Relationship status: Not on file  . Intimate partner violence:    Fear of current or ex partner: Not on file    Emotionally abused: Not on file    Physically abused: Not on file    Forced sexual activity: Not on file  Other Topics Concern  . Not on file  Social History Narrative   Marital status: divorced; dating seriously in 2019 for 26 years      Children:  2 children; 9 grandchildren; 7 gg.      Lives: with mother      Employment: Engineer, materials full time x 13 years      Tobacco: none      Alcohol: none      Drugs: none      Exercise: job physically active      Sexual activity: yes; same partner for years.   Family History  Problem Relation Age of Onset  . Heart disease Mother   . COPD Mother   . Asthma Mother   . Heart disease  Father 7       CAD/PTCA  . Alcohol abuse Father   . Arthritis Sister   . Colon cancer Maternal Uncle   . Colon polyps Neg Hx   . Prostate cancer Neg Hx   . Rectal cancer Neg Hx        Objective:    BP 130/70   Pulse 80   Temp 98 F (36.7 C) (Oral)   Resp 16   Ht 5' 4.96" (1.65 m)   Wt 145 lb (65.8 kg)   SpO2 97%   BMI 24.16 kg/m  Physical Exam  Constitutional: She is oriented to person, place, and time. She appears well-developed and well-nourished. No distress.  HENT:  Head: Normocephalic and atraumatic.  Right Ear: Hearing, tympanic membrane, external ear and ear canal normal.  Left Ear: Hearing, tympanic membrane, external ear and ear canal normal.  Nose: Nose normal.  Mouth/Throat: Oropharynx is clear and moist.  Eyes: Conjunctivae and EOM are normal. Pupils are equal, round, and reactive to light.  Neck: Normal range of motion and full passive range of motion without pain. Neck supple. No JVD present. Carotid bruit is not present. No thyromegaly present.  Cardiovascular: Normal rate, regular rhythm, normal heart sounds and intact distal pulses. Exam reveals no gallop and no friction rub.  No murmur  heard. Pulmonary/Chest: Effort normal and breath sounds normal. No respiratory distress. She has no wheezes. She has no rales. Right breast exhibits no inverted nipple, no mass, no nipple discharge, no skin change and no tenderness. Left breast exhibits no inverted nipple, no mass, no nipple discharge, no skin change and no tenderness. Breasts are symmetrical.  Abdominal: Soft. Bowel sounds are normal. She exhibits no distension and no mass. There is no tenderness. There is no rebound and no guarding.  Musculoskeletal:       Right shoulder: Normal.       Left shoulder: Normal.       Cervical back: Normal.  Lymphadenopathy:    She has no cervical adenopathy.  Neurological: She is alert and oriented to person, place, and time. She has normal reflexes. No cranial nerve deficit. She exhibits normal muscle tone. Coordination normal.  Skin: Skin is warm and dry. No rash noted. She is not diaphoretic. No erythema. No pallor.  Psychiatric: She has a normal mood and affect. Her behavior is normal. Judgment and thought content normal.  Nursing note and vitals reviewed.  No results found. Depression screen [facility] 2/9 02/16/2018 10/03/2017 01/15/2017 09/06/2016 09/04/2016  Decreased Interest 0 0 0 0 0  Down, Depressed, Hopeless 0 0 0 0 0  PHQ - 2 Score 0 0 0 0 0   Fall Risk  02/16/2018 10/03/2017 01/15/2017 09/06/2016 09/04/2016  Falls in the past year? No No No No No        Assessment & Plan:   1. Routine physical examination   2. Screening for diabetes mellitus   3. Screening, lipid   4. Attention deficit hyperactivity disorder (ADHD), predominantly hyperactive type   5. Insomnia due to other mental disorder   6. History of hepatitis C   7. H/O colectomy   8. Recurrent ventral incisional hernia s/p lap repair with mesh 9/3.   9. Family history of premature CAD    -anticipatory guidance provided --- exercise, weight loss, safe driving practices, aspirin 81mg  daily. -obtain age appropriate screening  labs and labs for chronic disease management.     Orders Placed This Encounter  Procedures  . Comprehensive  metabolic panel    Order Specific Question:   Has the patient fasted?    Answer:   No  . CBC with Differential/Platelet  . Hemoglobin A1c  . Lipid panel    Order Specific Question:   Has the patient fasted?    Answer:   No  . TSH  . POCT urinalysis dipstick   Meds ordered this encounter  Medications  . QUEtiapine (SEROQUEL) 25 MG tablet    Sig: TAKE 1 TABLET(25 MG) BY MOUTH AT BEDTIME    Dispense:  30 tablet    Refill:  5  . DISCONTD: amphetamine-dextroamphetamine (ADDERALL XR) 25 MG 24 hr capsule    Sig: Take 1 capsule by mouth every morning.    Dispense:  32 capsule    Refill:  0  . DISCONTD: amphetamine-dextroamphetamine (ADDERALL XR) 25 MG 24 hr capsule    Sig: Take 1 capsule by mouth every morning.    Dispense:  32 capsule    Refill:  0    DO NOT FILL FOR 30 DAYS AFTER PRESCRIBED.  Marland Kitchen DISCONTD: amphetamine-dextroamphetamine (ADDERALL XR) 25 MG 24 hr capsule    Sig: Take 1 capsule by mouth every morning.    Dispense:  32 capsule    Refill:  0    DO NOT FILL FOR 60 DAYS AFTER PRESCRIBED.    Return in about 6 months (around 08/19/2018) for follow-up chronic medical conditions.   Leroi Haque Paulita Fujita, M.D. Primary Care at [facility] previously [facility] [address] [place], [place]  [address] [phone] phone [phone] fax

## 2018-02-17 LAB — CBC WITH DIFFERENTIAL/PLATELET
BASOS ABS: 0 10*3/uL (ref 0.0–0.2)
Basos: 0 %
EOS (ABSOLUTE): 0.1 10*3/uL (ref 0.0–0.4)
Eos: 2 %
HEMOGLOBIN: 13.7 g/dL (ref 11.1–15.9)
Hematocrit: 40.3 % (ref 34.0–46.6)
Immature Grans (Abs): 0 10*3/uL (ref 0.0–0.1)
Immature Granulocytes: 0 %
LYMPHS ABS: 1.7 10*3/uL (ref 0.7–3.1)
Lymphs: 31 %
MCH: 30.4 pg (ref 26.6–33.0)
MCHC: 34 g/dL (ref 31.5–35.7)
MCV: 90 fL (ref 79–97)
Monocytes Absolute: 0.4 10*3/uL (ref 0.1–0.9)
Monocytes: 7 %
Neutrophils Absolute: 3.4 10*3/uL (ref 1.4–7.0)
Neutrophils: 60 %
Platelets: 284 10*3/uL (ref 150–379)
RBC: 4.5 x10E6/uL (ref 3.77–5.28)
RDW: 13.3 % (ref 12.3–15.4)
WBC: 5.6 10*3/uL (ref 3.4–10.8)

## 2018-02-17 LAB — TSH: TSH: 2.51 u[IU]/mL (ref 0.450–4.500)

## 2018-02-17 LAB — COMPREHENSIVE METABOLIC PANEL
A/G RATIO: 2 (ref 1.2–2.2)
ALBUMIN: 4.5 g/dL (ref 3.6–4.8)
ALT: 16 IU/L (ref 0–32)
AST: 14 IU/L (ref 0–40)
Alkaline Phosphatase: 40 IU/L (ref 39–117)
BILIRUBIN TOTAL: 0.4 mg/dL (ref 0.0–1.2)
BUN / CREAT RATIO: 16 (ref 12–28)
BUN: 11 mg/dL (ref 8–27)
CHLORIDE: 104 mmol/L (ref 96–106)
CO2: 23 mmol/L (ref 20–29)
Calcium: 9.5 mg/dL (ref 8.7–10.3)
Creatinine, Ser: 0.69 mg/dL (ref 0.57–1.00)
GFR calc non Af Amer: 94 mL/min/{1.73_m2} (ref >59)
GFR, EST AFRICAN AMERICAN: 109 mL/min/{1.73_m2} (ref >59)
Globulin, Total: 2.3 g/dL (ref 1.5–4.5)
Glucose: 95 mg/dL (ref 65–99)
POTASSIUM: 4.4 mmol/L (ref 3.5–5.2)
Sodium: 141 mmol/L (ref 134–144)
TOTAL PROTEIN: 6.8 g/dL (ref 6.0–8.5)

## 2018-02-17 LAB — LIPID PANEL
CHOL/HDL RATIO: 3.6 ratio (ref 0.0–4.4)
Cholesterol, Total: 190 mg/dL (ref 100–199)
HDL: 53 mg/dL (ref >39)
LDL CALC: 90 mg/dL (ref 0–99)
Triglycerides: 237 mg/dL — ABNORMAL HIGH (ref 0–149)
VLDL Cholesterol Cal: 47 mg/dL — ABNORMAL HIGH (ref 5–40)

## 2018-02-17 LAB — HEMOGLOBIN A1C
Est. average glucose Bld gHb Est-mCnc: 103 mg/dL
HEMOGLOBIN A1C: 5.2 % (ref 4.8–5.6)

## 2018-02-25 ENCOUNTER — Other Ambulatory Visit: Payer: Self-pay | Admitting: Family Medicine

## 2018-02-25 NOTE — Telephone Encounter (Signed)
Copied from CRM [id number]. Topic: Quick Communication - Rx Refill/Question >> Feb 25, 2018 11:48 AM Cipriano BunkerLambe, Annette S wrote: Medication:  amphetamine-dextroamphetamine (ADDERALL XR) 25 MG 24   She is asking for generic due to insurance. This was sent to wrong pharmacy  Has the patient contacted their pharmacy? Yes.    (Agent: If no, request that the patient contact the pharmacy for the refill.) Preferred Pharmacy (with phone number or street name):   [facility] [id number] [place], [place] - [id number] [facility] [address] [place] [id number] Phone: [phone] Fax: [phone]   Agent: Please be advised that RX refills may take up to 3 business days. We ask that you follow-up with your pharmacy.

## 2018-02-25 NOTE — Telephone Encounter (Signed)
Adderall XR  LOV 02/16/18 With Dr. Sheffield SliderSmith  [facility] [phone] [facility] [place]  [id number]

## 2018-02-26 MED ORDER — AMPHETAMINE-DEXTROAMPHET ER 25 MG PO CP24: 25.0000 mg | ORAL_CAPSULE | ORAL | 0 refills | Status: DC

## 2018-02-26 NOTE — Telephone Encounter (Signed)
[place] Controlled Substance Registry reviewed; also receiving [facility] [place].  Phentermine added to medication list.  Called [facility] to cancel three rx Adderall sent last week.  [facility] [address]

## 2018-03-20 ENCOUNTER — Encounter: Payer: Self-pay | Admitting: Family Medicine

## 2018-04-28 ENCOUNTER — Encounter: Payer: Self-pay | Admitting: Family Medicine

## 2018-07-13 ENCOUNTER — Encounter: Payer: Self-pay | Admitting: Family Medicine

## 2018-08-24 ENCOUNTER — Ambulatory Visit: Payer: BLUE CROSS/BLUE SHIELD | Admitting: Family Medicine

## 2019-02-01 ENCOUNTER — Emergency Department (HOSPITAL_COMMUNITY): Payer: BLUE CROSS/BLUE SHIELD

## 2019-02-01 ENCOUNTER — Other Ambulatory Visit: Payer: Self-pay

## 2019-02-01 ENCOUNTER — Emergency Department (HOSPITAL_COMMUNITY)
Admission: EM | Admit: 2019-02-01 | Discharge: 2019-02-01 | Disposition: A | Discharge status: Home / Self Care | Payer: BLUE CROSS/BLUE SHIELD | Attending: Emergency Medicine | Admitting: Emergency Medicine

## 2019-02-01 DIAGNOSIS — J111 Influenza due to unidentified influenza virus with other respiratory manifestations: Secondary | ICD-10-CM

## 2019-02-01 DIAGNOSIS — Z87891 Personal history of nicotine dependence: Secondary | ICD-10-CM | POA: Diagnosis not present

## 2019-02-01 DIAGNOSIS — R509 Fever, unspecified: Secondary | ICD-10-CM | POA: Diagnosis present

## 2019-02-01 DIAGNOSIS — R69 Illness, unspecified: Secondary | ICD-10-CM

## 2019-02-01 LAB — URINALYSIS, ROUTINE W REFLEX MICROSCOPIC
Bacteria, UA: NONE SEEN
Bilirubin Urine: NEGATIVE
Glucose, UA: NEGATIVE mg/dL
Ketones, ur: NEGATIVE mg/dL
NITRITE: NEGATIVE
Protein, ur: NEGATIVE mg/dL
Specific Gravity, Urine: 1.026 (ref 1.005–1.030)
pH: 5 (ref 5.0–8.0)

## 2019-02-01 LAB — CBC WITH DIFFERENTIAL/PLATELET
Abs Immature Granulocytes: 0.02 10*3/uL (ref 0.00–0.07)
Basophils Absolute: 0 10*3/uL (ref 0.0–0.1)
Basophils Relative: 0 %
Eosinophils Absolute: 0.1 10*3/uL (ref 0.0–0.5)
Eosinophils Relative: 1 %
HCT: 38.5 % (ref 36.0–46.0)
Hemoglobin: 12.5 g/dL (ref 12.0–15.0)
Immature Granulocytes: 0 %
Lymphocytes Relative: 17 %
Lymphs Abs: 1.4 10*3/uL (ref 0.7–4.0)
MCH: 30 pg (ref 26.0–34.0)
MCHC: 32.5 g/dL (ref 30.0–36.0)
MCV: 92.5 fL (ref 80.0–100.0)
Monocytes Absolute: 0.7 10*3/uL (ref 0.1–1.0)
Monocytes Relative: 9 %
Neutro Abs: 6.1 10*3/uL (ref 1.7–7.7)
Neutrophils Relative %: 73 %
PLATELETS: 242 10*3/uL (ref 150–400)
RBC: 4.16 MIL/uL (ref 3.87–5.11)
RDW: 12.5 % (ref 11.5–15.5)
WBC: 8.4 10*3/uL (ref 4.0–10.5)
nRBC: 0 % (ref 0.0–0.2)

## 2019-02-01 LAB — BASIC METABOLIC PANEL
Anion gap: 7 (ref 5–15)
BUN: 17 mg/dL (ref 8–23)
CO2: 24 mmol/L (ref 22–32)
Calcium: 9 mg/dL (ref 8.9–10.3)
Chloride: 106 mmol/L (ref 98–111)
Creatinine, Ser: 0.62 mg/dL (ref 0.44–1.00)
GFR calc Af Amer: >60 mL/min (ref >60)
GFR calc non Af Amer: >60 mL/min (ref >60)
Glucose, Bld: 107 mg/dL — ABNORMAL HIGH (ref 70–99)
Potassium: 4.3 mmol/L (ref 3.5–5.1)
Sodium: 137 mmol/L (ref 135–145)

## 2019-02-01 MED ORDER — SODIUM CHLORIDE 0.9 % IV BOLUS
1000.0000 mL | Freq: Once | INTRAVENOUS | Status: AC
  Administered 2019-02-01: 1000 mL via INTRAVENOUS

## 2019-02-01 MED ORDER — KETOROLAC TROMETHAMINE 30 MG/ML IJ SOLN
15.0000 mg | Freq: Once | INTRAMUSCULAR | Status: AC
  Administered 2019-02-01: 15 mg via INTRAVENOUS
  Filled 2019-02-01: qty 1

## 2019-02-01 MED ORDER — ONDANSETRON HCL 4 MG/2ML IJ SOLN
4.0000 mg | Freq: Once | INTRAMUSCULAR | Status: AC
  Administered 2019-02-01: 4 mg via INTRAVENOUS
  Filled 2019-02-01: qty 2

## 2019-02-01 MED ORDER — ONDANSETRON HCL 4 MG PO TABS: 4.0000 mg | ORAL_TABLET | Freq: Four times a day (QID) | ORAL | 0 refills | Status: DC

## 2019-02-01 NOTE — ED Triage Notes (Signed)
Patient reports she has had a fever x4 days and has been taking extra strength tylenol to keep her fever under control and she has been having sweats. Pt reports she feels weak and pressure in her head. Pt is alert and oriented at this time. Patient's face is red in triage

## 2019-02-01 NOTE — ED Provider Notes (Signed)
[facility]-EMERGENCY DEPT Provider Note   CSN: [id number] Arrival date & time: 02/01/19  0039    History   Chief Complaint Chief Complaint  Patient presents with  . Fever  . Generalized Body Aches    HPI [name] is a 63 y.o. female.     Patient presents to the emergency department for evaluation of flulike symptoms.  Patient has been sick for 4 days.  She has been experiencing fever, chills, sweats, generalized aches and pains.  She has had a sore throat, slight nonproductive cough.  He has a mild headache and bilateral ear pain.  She has been taking Tylenol.  This has helped with her generalized aches but she still feels weak.  She has had contact with the flu at work.     Past Medical History:  Diagnosis Date  . ADHD (attention deficit hyperactivity disorder)   . Anxiety   . Arthritis    "in my neck"  . Diverticulitis large intestine   . Frequency   . GERD (gastroesophageal reflux disease)   . H/O hiatal hernia   . Hepatitis C   . Insomnia   . 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 Urination, excessive at night     Patient Active Problem List   Diagnosis Date Noted  . Dyslipidemia, goal LDL below 130 02/22/2017  . Chest pain with low risk for cardiac etiology 02/20/2017  . Family history of premature CAD 02/20/2017  . Esophageal reflux 03/02/2016  . History of hepatitis C 02/26/2015  . H/O colectomy 02/26/2015  . Stricture and stenosis of esophagus 05/22/2012  . Recurrent ventral incisional hernia s/p lap repair with mesh 9/3. 05/20/2012  . ADD 02/28/2010  . DIVERTICULITIS, HX OF 02/28/2010  . OTHER ANXIETY STATES 10/02/2009    Past Surgical History:  Procedure Laterality Date  . ABDOMINAL SURGERY  ~ 2006   "got toothpick out of intestines"  . APPENDECTOMY  2011  . CHOLECYSTECTOMY  2006  . COLECTOMY  2011   subtotal   . DIAGNOSTIC LAPAROSCOPY  08/04/2012   LOA; VHR w/mesh  .  ECTOPIC PREGNANCY SURGERY  1982   "twins"  . HERNIA REPAIR  2011  . LEG TENDON SURGERY  ~ 2005   left; "still have a bolt in it"  . ureathal cyst    . VAGINAL HYSTERECTOMY  1990's   enodmetriosis; ectopic pregnancies x 2; ovaries resected.  Marland Kitchen VENTRAL HERNIA REPAIR  08/04/2012   Procedure: LAPAROSCOPIC VENTRAL HERNIA;  Surgeon: Adolph Pollack, MD;  Location: [facility] OR;  Service: General;  Laterality: N/A;  laparoscopic possible open ventral hernia repair with mesh     OB History   No obstetric history on file.      Home Medications    Prior to Admission medications   Medication Sig Start Date End Date Taking? Authorizing Provider  acetaminophen (TYLENOL) 500 MG tablet Take 1,000 mg by mouth every 6 (six) hours as needed for mild pain, moderate pain or headache.   Yes [provider]  DM-Doxylamine-Acetaminophen (NYQUIL COLD & FLU PO) Take 30 mLs by mouth at bedtime as needed (sleep/pain).   Yes [provider]  amphetamine-dextroamphetamine (ADDERALL XR) 25 MG 24 hr capsule Take 1 capsule by mouth every morning. Patient not taking: Reported on 02/01/2019 02/26/18   Ethelda Chick, MD  ondansetron (ZOFRAN) 4 MG tablet Take 1 tablet (4 mg total) by mouth every 6 (six) hours. 02/01/19  Gilda Crease, MD  QUEtiapine (SEROQUEL) 25 MG tablet TAKE 1 TABLET(25 MG) BY MOUTH AT BEDTIME Patient not taking: Reported on 02/01/2019 02/16/18   Ethelda Chick, MD    Family History Family History  Problem Relation Age of Onset  . Heart disease Mother   . COPD Mother   . Asthma Mother   . Heart disease Father 10       CAD/PTCA  . Alcohol abuse Father   . Arthritis Sister   . Colon cancer Maternal Uncle   . Colon polyps Neg Hx   . Prostate cancer Neg Hx   . Rectal cancer Neg Hx     Social History Social History   Tobacco Use  . Smoking status: Former Smoker    Years: 4.00    Types: Cigarettes    Last attempt to quit: 12/02/1994    Years since quitting: 24.1  .  Smokeless tobacco: Never Used  . Tobacco comment: 08/04/2012 "quit smoking cigarettes 15-20 yr ago; really a recreational thing"  Substance Use Topics  . Alcohol use: Yes    Alcohol/week: 4.0 standard drinks    Types: 4 Glasses of wine per week  . Drug use: No     Allergies   Patient has no known allergies.   Review of Systems Review of Systems  Constitutional: Positive for chills and fever.  HENT: Positive for congestion and sore throat.   Respiratory: Positive for cough.   All other systems reviewed and are negative.    Physical Exam Updated Vital Signs BP 126/60   Pulse 86   Temp 97.7 F (36.5 C) (Oral)   Resp (!) 25   SpO2 97%   Physical Exam Vitals signs and nursing note reviewed.  Constitutional:      General: She is not in acute distress.    Appearance: Normal appearance. She is well-developed.  HENT:     Head: Normocephalic and atraumatic.     Right Ear: Hearing normal.     Left Ear: Hearing normal.     Nose: Nose normal.  Eyes:     Conjunctiva/sclera: Conjunctivae normal.     Pupils: Pupils are equal, round, and reactive to light.  Neck:     Musculoskeletal: Normal range of motion and neck supple.  Cardiovascular:     Rate and Rhythm: Regular rhythm.     Heart sounds: S1 normal and S2 normal. No murmur. No friction rub. No gallop.   Pulmonary:     Effort: Pulmonary effort is normal. No respiratory distress.     Breath sounds: Normal breath sounds.  Chest:     Chest wall: No tenderness.  Abdominal:     General: Bowel sounds are normal.     Palpations: Abdomen is soft.     Tenderness: There is no abdominal tenderness. There is no guarding or rebound. Negative signs include Murphy's sign and McBurney's sign.     Hernia: No hernia is present.  Musculoskeletal: Normal range of motion.  Skin:    General: Skin is warm and dry.     Findings: No rash.  Neurological:     Mental Status: She is alert and oriented to person, place, and time.     GCS: GCS  eye subscore is 4. GCS verbal subscore is 5. GCS motor subscore is 6.     Cranial Nerves: No cranial nerve deficit.     Sensory: No sensory deficit.     Coordination: Coordination normal.  Psychiatric:  Speech: Speech normal.        Behavior: Behavior normal.        Thought Content: Thought content normal.      ED Treatments / Results  Labs (all labs ordered are listed, but only abnormal results are displayed) Labs Reviewed  BASIC METABOLIC PANEL - Abnormal; Notable for the following components:      Result Value   Glucose, Bld 107 (*)    All other components within normal limits  URINALYSIS, ROUTINE W REFLEX MICROSCOPIC - Abnormal; Notable for the following components:   Hgb urine dipstick MODERATE (*)    Leukocytes,Ua MODERATE (*)    All other components within normal limits  URINE CULTURE  CBC WITH DIFFERENTIAL/PLATELET    EKG EKG Interpretation  Date/Time:  Monday February 01 2019 02:38:34 EST Ventricular Rate:  88 PR Interval:    QRS Duration: 84 QT Interval:  390 QTC Calculation: 472 R Axis:   49 Text Interpretation:  Sinus rhythm Consider right atrial enlargement Confirmed by Gilda Crease [phone]) on 02/01/2019 2:50:48 AM   Radiology Dg Chest 2 View  Result Date: 02/01/2019 CLINICAL DATA:  Fever and cough EXAM: CHEST - 2 VIEW COMPARISON:  09/02/2016 FINDINGS: Linear scarring or atelectasis at the bases. No acute consolidation or effusion. Normal heart size. No pneumothorax IMPRESSION: No active cardiopulmonary disease. Mild atelectasis or scarring at the bases Electronically Signed   By: Jasmine Pang M.D.   On: 02/01/2019 02:33    Procedures Procedures (including critical care time)  Medications Ordered in ED Medications  sodium chloride 0.9 % bolus 1,000 mL (1,000 mLs Intravenous New Bag/Given 02/01/19 0223)  ondansetron (ZOFRAN) injection 4 mg (4 mg Intravenous Given 02/01/19 0224)  ketorolac (TORADOL) 30 MG/ML injection 15 mg (15 mg Intravenous  Given 02/01/19 0224)     Initial Impression / Assessment and Plan / ED Course  I have reviewed the triage vital signs and the nursing notes.  Pertinent labs & imaging results that were available during my care of the patient were reviewed by me and considered in my medical decision making (see chart for details).        Patient presents to the emergency department for evaluation of flulike illness.  Symptoms have been present for 4 days.  Patient reports that she has been taking Tylenol for the fever but now has developed some nausea and increasing chills, myalgias and aches.  She has had a very slight cough.  Lungs are clear.  Chest x-ray is clear.  Blood work is unremarkable.  Patient feeling much improvement after Toradol, Zofran,  IV fluids.  Urinalysis did have white cells but no bacteria, negative nitrite.  Is not experiencing symptoms and this does not explain her upper respiratory infection symptoms.  We will culture, will not initiate treatment until culture results.  Otherwise treat symptomatically.  Final Clinical Impressions(s) / ED Diagnoses   Final diagnoses:  Influenza-like illness    ED Discharge Orders         Ordered    ondansetron (ZOFRAN) 4 MG tablet  Every 6 hours     02/01/19 0407           Gilda Crease, MD 02/01/19 [phone]

## 2019-02-03 LAB — URINE CULTURE

## 2019-09-02 ENCOUNTER — Other Ambulatory Visit: Payer: Self-pay

## 2019-09-02 ENCOUNTER — Encounter (HOSPITAL_COMMUNITY): Payer: Self-pay

## 2019-09-02 ENCOUNTER — Ambulatory Visit (HOSPITAL_COMMUNITY)
Admission: EM | Admit: 2019-09-02 | Discharge: 2019-09-02 | Disposition: A | Discharge status: Home / Self Care | Payer: BLUE CROSS/BLUE SHIELD | Attending: Family Medicine | Admitting: Family Medicine

## 2019-09-02 DIAGNOSIS — J019 Acute sinusitis, unspecified: Secondary | ICD-10-CM | POA: Diagnosis not present

## 2019-09-02 MED ORDER — BENZONATATE 100 MG PO CAPS: 100.0000 mg | ORAL_CAPSULE | Freq: Three times a day (TID) | ORAL | 0 refills | Status: DC

## 2019-09-02 MED ORDER — AZITHROMYCIN 250 MG PO TABS: 250.0000 mg | ORAL_TABLET | Freq: Every day | ORAL | 0 refills | Status: DC

## 2019-09-02 NOTE — ED Provider Notes (Signed)
[facility]    CSN: [id number] Arrival date & time: 09/02/19  4098      History   Chief Complaint Chief Complaint  Patient presents with  . Cough  . Otalgia    HPI [name] is a 63 y.o. female.   Patient is a 63 year old female past medical history of ADHD, anxiety, arthritis, diverticulitis, GERD, hep C, neuropathy.  She presents today with approximately 5 to 6 days of nasal congestion, sinus pressure, rhinorrhea, ear pressure, cough and chest pain with coughing.  Symptoms have been constant and worsening.  She has been taking Mucinex and Sudafed without much relief.  History of sinus infections.  Reporting this feels similar.  Also having some associated mild dizziness, body aches and fatigue.  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  She was tested yesterday.  No obvious fevers.  ROS per HPI      Past Medical History:  Diagnosis Date  . ADHD (attention deficit hyperactivity disorder)   . Anxiety   . Arthritis    "in my neck"  . Diverticulitis large intestine   . Frequency   . GERD (gastroesophageal reflux disease)   . H/O hiatal hernia   . Hepatitis C   . Insomnia   . 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 Urination, excessive at night     Patient Active Problem List   Diagnosis Date Noted  . Dyslipidemia, goal LDL below 130 02/22/2017  . Chest pain with low risk for cardiac etiology 02/20/2017  . Family history of premature CAD 02/20/2017  . Esophageal reflux 03/02/2016  . History of hepatitis C 02/26/2015  . H/O colectomy 02/26/2015  . Stricture and stenosis of esophagus 05/22/2012  . Recurrent ventral incisional hernia s/p lap repair with mesh 9/3. 05/20/2012  . ADD 02/28/2010  . DIVERTICULITIS, HX OF 02/28/2010  . OTHER ANXIETY STATES 10/02/2009    Past Surgical History:  Procedure Laterality Date  . ABDOMINAL SURGERY  ~ 2006   "got toothpick out of intestines"  .  APPENDECTOMY  2011  . CHOLECYSTECTOMY  2006  . COLECTOMY  2011   subtotal   . DIAGNOSTIC LAPAROSCOPY  08/04/2012   LOA; VHR w/mesh  . ECTOPIC PREGNANCY SURGERY  1982   "twins"  . HERNIA REPAIR  2011  . LEG TENDON SURGERY  ~ 2005   left; "still have a bolt in it"  . ureathal cyst    . VAGINAL HYSTERECTOMY  1990's   enodmetriosis; ectopic pregnancies x 2; ovaries resected.  Marland Kitchen VENTRAL HERNIA REPAIR  08/04/2012   Procedure: LAPAROSCOPIC VENTRAL HERNIA;  Surgeon: Adolph Pollack, MD;  Location: [facility] OR;  Service: General;  Laterality: N/A;  laparoscopic possible open ventral hernia repair with mesh    OB History   No obstetric history on file.      Home Medications    Prior to Admission medications   Medication Sig Start Date End Date Taking? Authorizing Provider  acetaminophen (TYLENOL) 500 MG tablet Take 1,000 mg by mouth every 6 (six) hours as needed for mild pain, moderate pain or headache.    [provider]  azithromycin (ZITHROMAX) 250 MG tablet Take 1 tablet (250 mg total) by mouth daily. Take first 2 tablets together, then 1 every day until finished. 09/02/19   Shterna Laramee, Gloris Manchester A, NP  benzonatate (TESSALON) 100 MG capsule Take 1 capsule (100 mg total) by mouth every 8 (eight) hours. 09/02/19  Dahlia ByesBast, Nyasha Rahilly A, NP  DM-Doxylamine-Acetaminophen (NYQUIL COLD & FLU PO) Take 30 mLs by mouth at bedtime as needed (sleep/pain).    [provider]  ondansetron (ZOFRAN) 4 MG tablet Take 1 tablet (4 mg total) by mouth every 6 (six) hours. 02/01/19   Gilda CreasePollina, Christopher J, MD  amphetamine-dextroamphetamine (ADDERALL XR) 25 MG 24 hr capsule Take 1 capsule by mouth every morning. Patient not taking: Reported on 02/01/2019 02/26/18 09/02/19  Ethelda ChickSmith, Kristi M, MD  QUEtiapine (SEROQUEL) 25 MG tablet TAKE 1 TABLET(25 MG) BY MOUTH AT BEDTIME Patient not taking: Reported on 02/01/2019 02/16/18 09/02/19  Ethelda ChickSmith, Kristi M, MD    Family History Family History  Problem Relation Age of Onset  . Heart  disease Mother   . COPD Mother   . Asthma Mother   . Heart disease Father 2955       CAD/PTCA  . Alcohol abuse Father   . Arthritis Sister   . Colon cancer Maternal Uncle   . Colon polyps Neg Hx   . Prostate cancer Neg Hx   . Rectal cancer Neg Hx     Social History Social History   Tobacco Use  . Smoking status: Former Smoker    Years: 4.00    Types: Cigarettes    Quit date: 12/02/1994    Years since quitting: 24.7  . Smokeless tobacco: Never Used  . Tobacco comment: 08/04/2012 "quit smoking cigarettes 15-20 yr ago; really a recreational thing"  Substance Use Topics  . Alcohol use: Yes    Alcohol/week: 4.0 standard drinks    Types: 4 Glasses of wine per week  . Drug use: No     Allergies   Patient has no known allergies.   Review of Systems Review of Systems   Physical Exam Triage Vital Signs ED Triage Vitals [09/02/19 0939]  Enc Vitals Group     BP      Pulse      Resp      Temp      Temp src      SpO2      Weight 158 lb (71.7 kg)     Height      Head Circumference      Peak Flow      Pain Score 6     Pain Loc      Pain Edu?      Excl. in GC?    No data found.  Updated Vital Signs Wt 158 lb (71.7 kg)   BMI 26.32 kg/m   Visual Acuity Right Eye Distance:   Left Eye Distance:   Bilateral Distance:    Right Eye Near:   Left Eye Near:    Bilateral Near:     Physical Exam Vitals signs and nursing note reviewed.  Constitutional:      General: She is not in acute distress.    Appearance: Normal appearance. She is ill-appearing. She is not toxic-appearing or diaphoretic.  HENT:     Head: Normocephalic and atraumatic.     Right Ear: Tympanic membrane and ear canal normal.     Left Ear: Tympanic membrane normal.     Nose: Congestion and rhinorrhea present.     Comments: Moderate nasal turbinate swelling with maxillary and frontal sinus tenderness. Mucus in nares    Mouth/Throat:     Pharynx: Oropharynx is clear.  Eyes:     Conjunctiva/sclera:  Conjunctivae normal.  Neck:     Musculoskeletal: Normal range of motion and neck supple. No  neck rigidity.  Cardiovascular:     Rate and Rhythm: Normal rate and regular rhythm.     Pulses: Normal pulses.     Heart sounds: Normal heart sounds.  Pulmonary:     Effort: Pulmonary effort is normal.     Breath sounds: Normal breath sounds.  Musculoskeletal: Normal range of motion.  Skin:    General: Skin is warm and dry.  Neurological:     Mental Status: She is alert.  Psychiatric:        Mood and Affect: Mood normal.      UC Treatments / Results  Labs (all labs ordered are listed, but only abnormal results are displayed) Labs Reviewed - No data to display  EKG   Radiology No results found.  Procedures Procedures (including critical care time)  Medications Ordered in UC Medications - No data to display  Initial Impression / Assessment and Plan / UC Course  I have reviewed the triage vital signs and the nursing notes.  Pertinent labs & imaging results that were available during my care of the patient were reviewed by me and considered in my medical decision making (see chart for details).     URI with sinusitis.  Treating for sinus infection with azithromycin.  We will have her continue the Mucinex and Sudafed.  She can take Tessalon Perles for cough. Rest and stay hydrated Follow up as needed for continued or worsening symptoms   Final Clinical Impressions(s) / UC Diagnoses   Final diagnoses:  Acute non-recurrent sinusitis, unspecified location     Discharge Instructions     Treating you for sinus infection Take the medication as prescribed Keep using the mucinex Follow up as needed for continued or worsening symptoms    ED Prescriptions    Medication Sig Dispense Auth. Provider   azithromycin (ZITHROMAX) 250 MG tablet Take 1 tablet (250 mg total) by mouth daily. Take first 2 tablets together, then 1 every day until finished. 6 tablet Tavaria Mackins A, NP    benzonatate (TESSALON) 100 MG capsule Take 1 capsule (100 mg total) by mouth every 8 (eight) hours. 21 capsule Dell Hurtubise A, NP     PDMP not reviewed this encounter.   Loura Halt A, NP 09/02/19 1057

## 2019-09-02 NOTE — ED Triage Notes (Signed)
Pt states she has a deep cough and chest congestion and right ear discomfort  X 3 days.Pt states she has been tested for Covid yerserday.

## 2019-09-02 NOTE — Discharge Instructions (Addendum)
Treating you for sinus infection Take the medication as prescribed Keep using the mucinex Follow up as needed for continued or worsening symptoms

## 2020-12-07 ENCOUNTER — Ambulatory Visit (HOSPITAL_COMMUNITY)
Admission: EM | Admit: 2020-12-07 | Discharge: 2020-12-07 | Disposition: A | Discharge status: Home / Self Care | Payer: BLUE CROSS/BLUE SHIELD | Attending: Family Medicine | Admitting: Family Medicine

## 2020-12-07 ENCOUNTER — Encounter (HOSPITAL_COMMUNITY): Payer: Self-pay

## 2020-12-07 DIAGNOSIS — L03011 Cellulitis of right finger: Secondary | ICD-10-CM

## 2020-12-07 DIAGNOSIS — R030 Elevated blood-pressure reading, without diagnosis of hypertension: Secondary | ICD-10-CM

## 2020-12-07 MED ORDER — DOXYCYCLINE HYCLATE 100 MG PO CAPS: 100.0000 mg | ORAL_CAPSULE | Freq: Two times a day (BID) | ORAL | 0 refills | Status: DC

## 2020-12-07 MED ORDER — HYDROCODONE-ACETAMINOPHEN 5-325 MG PO TABS: 1.0000 | ORAL_TABLET | Freq: Four times a day (QID) | ORAL | 0 refills | Status: DC | PRN

## 2020-12-07 NOTE — Discharge Instructions (Addendum)

## 2020-12-07 NOTE — ED Triage Notes (Signed)
Pt presents with swelling on right pinky x 3 days. Pt states the acrylic nail she had on her pinky bent and went into her nail bed. Pt states the pain arm and neck.

## 2020-12-12 NOTE — ED Provider Notes (Signed)
[facility]   [id number] 12/07/20 Arrival Time: 1501  ASSESSMENT & PLAN:  1. Paronychia of finger of right hand   2. Elevated blood pressure reading without diagnosis of hypertension     Incision and Drainage Procedure Note  Anesthesia: 1% plain lidocaine to perform digital block  Procedure Details  The procedure, risks and complications have been discussed in detail (including, but not limited to pain and bleeding) with the patient.  The skin induration was prepped and draped in the usual fashion. After adequate local anesthesia, I&D with a #11 blade was performed on the right distal 5th finger under nail with purulent drainage.  EBL: minimal Drains: none Packing: n/a Condition: Tolerated procedure well Complications: none.  Begin: Meds ordered this encounter  Medications  . doxycycline (VIBRAMYCIN) 100 MG capsule    Sig: Take 1 capsule (100 mg total) by mouth 2 (two) times daily.    Dispense:  14 capsule    Refill:  0  . HYDROcodone-acetaminophen (NORCO/VICODIN) 5-325 MG tablet    Sig: Take 1 tablet by mouth every 6 (six) hours as needed for moderate pain or severe pain.    Dispense:  8 tablet    Refill:  0     Follow-up Information    Schedule an appointment as soon as possible for a visit  with Dominica Severin, MD.   Specialty: Orthopedic Surgery Contact information: [address] [place] [address] [place] [address] [phone]               Reviewed expectations re: course of current medical issues. Questions answered. Outlined signs and symptoms indicating need for more acute intervention. Patient verbalized understanding. After Visit Summary given.   SUBJECTIVE:  [name] is a 65 y.o. female who presents with a possible infection of her R 5th figner at/under nail. Onset gradual, approximately 3 days ago without active drainage and without active bleeding. Increasing pain. Symptoms have gradually worsened since beginning.  Fever: absent. OTC/home treatment: none.  Increased blood pressure noted today. Reports that she is not treated. No symptoms.   OBJECTIVE:  Vitals:   12/07/20 1700  BP: (!) 194/75  Pulse: 93  Resp: 16  Temp: 97.9 F (36.6 C)  TempSrc: Oral  SpO2: 100%     General appearance: alert; no distress R 5th finger: significant paronychia present around and extending under nail; very painful; normal cap refill and distal sensation Psychological: alert and cooperative; normal mood and affect  No Known Allergies  Past Medical History:  Diagnosis Date  . ADHD (attention deficit hyperactivity disorder)   . Anxiety   . Arthritis    "in my neck"  . Diverticulitis large intestine   . Frequency   . GERD (gastroesophageal reflux disease)   . H/O hiatal hernia   . Hepatitis C   . Insomnia   . 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 Urination, excessive at night    Social History   Socioeconomic History  . Marital status: Divorced    Spouse name: Not on file  . Number of children: 2  . Years of education: Not on file  . Highest education level: Not on file  Occupational History  . Occupation: Event organiser: [employer]  Tobacco Use  . Smoking status: Former Smoker    Years: 4.00    Types: Cigarettes    Quit date: 12/02/1994    Years since quitting: 26.0  . Smokeless  tobacco: Never Used  . Tobacco comment: 08/04/2012 "quit smoking cigarettes 15-20 yr ago; really a recreational thing"  Substance and Sexual Activity  . Alcohol use: Yes    Alcohol/week: 4.0 standard drinks    Types: 4 Glasses of wine per week  . Drug use: No  . Sexual activity: Yes    Birth control/protection: Surgical, Post-menopausal  Other Topics Concern  . Not on file  Social History Narrative   Marital status: divorced; dating seriously in 2019 for 26 years      Children:  2 children; 9 grandchildren; 7 gg.      Lives: with mother       Employment: Engineer, materials full time x 13 years      Tobacco: none      Alcohol: none      Drugs: none      Exercise: job physically active      Sexual activity: yes; same partner for years.   Social Determinants of Health   Financial Resource Strain: Not on file  Food Insecurity: Not on file  Transportation Needs: Not on file  Physical Activity: Not on file  Stress: Not on file  Social Connections: Not on file   Family History  Problem Relation Age of Onset  . Heart disease Mother   . COPD Mother   . Asthma Mother   . Heart disease Father 12       CAD/PTCA  . Alcohol abuse Father   . Arthritis Sister   . Colon cancer Maternal Uncle   . Colon polyps Neg Hx   . Prostate cancer Neg Hx   . Rectal cancer Neg Hx    Past Surgical History:  Procedure Laterality Date  . ABDOMINAL SURGERY  ~ 2006   "got toothpick out of intestines"  . APPENDECTOMY  2011  . CHOLECYSTECTOMY  2006  . COLECTOMY  2011   subtotal   . DIAGNOSTIC LAPAROSCOPY  08/04/2012   LOA; VHR w/mesh  . ECTOPIC PREGNANCY SURGERY  1982   "twins"  . HERNIA REPAIR  2011  . LEG TENDON SURGERY  ~ 2005   left; "still have a bolt in it"  . ureathal cyst    . VAGINAL HYSTERECTOMY  1990's   enodmetriosis; ectopic pregnancies x 2; ovaries resected.  Marland Kitchen VENTRAL HERNIA REPAIR  08/04/2012   Procedure: LAPAROSCOPIC VENTRAL HERNIA;  Surgeon: Adolph Pollack, MD;  Location: [facility] OR;  Service: General;  Laterality: N/A;  laparoscopic possible open ventral hernia repair with mesh           Mardella Layman, MD 12/12/20 [phone]

## 2023-03-01 ENCOUNTER — Observation Stay (HOSPITAL_COMMUNITY)
Admission: EM | Admit: 2023-03-01 | Discharge: 2023-03-02 | Disposition: A | Discharge status: Home / Self Care | Payer: BLUE CROSS/BLUE SHIELD | Attending: Orthopaedic Surgery | Admitting: Orthopaedic Surgery

## 2023-03-01 ENCOUNTER — Encounter (HOSPITAL_COMMUNITY)
Admission: EM | Disposition: A | Discharge status: Home / Self Care | Payer: Self-pay | Source: Home / Self Care | Attending: Emergency Medicine

## 2023-03-01 ENCOUNTER — Emergency Department (HOSPITAL_COMMUNITY): Payer: BLUE CROSS/BLUE SHIELD

## 2023-03-01 ENCOUNTER — Inpatient Hospital Stay (HOSPITAL_COMMUNITY): Payer: BLUE CROSS/BLUE SHIELD

## 2023-03-01 ENCOUNTER — Emergency Department (HOSPITAL_COMMUNITY): Payer: BLUE CROSS/BLUE SHIELD | Admitting: Registered Nurse

## 2023-03-01 ENCOUNTER — Other Ambulatory Visit: Payer: Self-pay

## 2023-03-01 ENCOUNTER — Encounter (HOSPITAL_COMMUNITY): Payer: Self-pay | Admitting: *Deleted

## 2023-03-01 DIAGNOSIS — Z23 Encounter for immunization: Secondary | ICD-10-CM | POA: Diagnosis not present

## 2023-03-01 DIAGNOSIS — Z79899 Other long term (current) drug therapy: Secondary | ICD-10-CM | POA: Diagnosis not present

## 2023-03-01 DIAGNOSIS — Y92009 Unspecified place in unspecified non-institutional (private) residence as the place of occurrence of the external cause: Secondary | ICD-10-CM | POA: Insufficient documentation

## 2023-03-01 DIAGNOSIS — Z87891 Personal history of nicotine dependence: Secondary | ICD-10-CM | POA: Insufficient documentation

## 2023-03-01 DIAGNOSIS — S98121A Partial traumatic amputation of right great toe, initial encounter: Secondary | ICD-10-CM | POA: Diagnosis not present

## 2023-03-01 DIAGNOSIS — W28XXXA Contact with powered lawn mower, initial encounter: Secondary | ICD-10-CM | POA: Diagnosis not present

## 2023-03-01 DIAGNOSIS — Z9889 Other specified postprocedural states: Secondary | ICD-10-CM

## 2023-03-01 DIAGNOSIS — S98111A Complete traumatic amputation of right great toe, initial encounter: Principal | ICD-10-CM

## 2023-03-01 HISTORY — PX: AMPUTATION: SHX166

## 2023-03-01 LAB — CBC
HCT: 42.8 % (ref 36.0–46.0)
Hemoglobin: 14.3 g/dL (ref 12.0–15.0)
MCH: 30.7 pg (ref 26.0–34.0)
MCHC: 33.4 g/dL (ref 30.0–36.0)
MCV: 91.8 fL (ref 80.0–100.0)
Platelets: 274 10*3/uL (ref 150–400)
RBC: 4.66 MIL/uL (ref 3.87–5.11)
RDW: 12.4 % (ref 11.5–15.5)
WBC: 6.2 10*3/uL (ref 4.0–10.5)
nRBC: 0 % (ref 0.0–0.2)

## 2023-03-01 LAB — SAMPLE TO BLOOD BANK

## 2023-03-01 LAB — I-STAT CHEM 8, ED
BUN: 22 mg/dL (ref 8–23)
Calcium, Ion: 1.14 mmol/L — ABNORMAL LOW (ref 1.15–1.40)
Chloride: 104 mmol/L (ref 98–111)
Creatinine, Ser: 1.2 mg/dL — ABNORMAL HIGH (ref 0.44–1.00)
Glucose, Bld: 98 mg/dL (ref 70–99)
HCT: 43 % (ref 36.0–46.0)
Hemoglobin: 14.6 g/dL (ref 12.0–15.0)
Potassium: 3.5 mmol/L (ref 3.5–5.1)
Sodium: 140 mmol/L (ref 135–145)
TCO2: 23 mmol/L (ref 22–32)

## 2023-03-01 LAB — COMPREHENSIVE METABOLIC PANEL
ALT: 17 U/L (ref 0–44)
AST: 23 U/L (ref 15–41)
Albumin: 4.3 g/dL (ref 3.5–5.0)
Alkaline Phosphatase: 36 U/L — ABNORMAL LOW (ref 38–126)
Anion gap: 13 (ref 5–15)
BUN: 21 mg/dL (ref 8–23)
CO2: 21 mmol/L — ABNORMAL LOW (ref 22–32)
Calcium: 9.4 mg/dL (ref 8.9–10.3)
Chloride: 104 mmol/L (ref 98–111)
Creatinine, Ser: 1.03 mg/dL — ABNORMAL HIGH (ref 0.44–1.00)
GFR, Estimated: 60 mL/min — ABNORMAL LOW (ref >60)
Glucose, Bld: 107 mg/dL — ABNORMAL HIGH (ref 70–99)
Potassium: 3.6 mmol/L (ref 3.5–5.1)
Sodium: 138 mmol/L (ref 135–145)
Total Bilirubin: 0.8 mg/dL (ref 0.3–1.2)
Total Protein: 6.9 g/dL (ref 6.5–8.1)

## 2023-03-01 LAB — ETHANOL: Alcohol, Ethyl (B): 86 mg/dL — ABNORMAL HIGH (ref <10)

## 2023-03-01 LAB — PROTIME-INR
INR: 1 (ref 0.8–1.2)
Prothrombin Time: 12.6 seconds (ref 11.4–15.2)

## 2023-03-01 LAB — LACTIC ACID, PLASMA: Lactic Acid, Venous: 2.1 mmol/L (ref 0.5–1.9)

## 2023-03-01 SURGERY — AMPUTATION, FOOT, RAY
Anesthesia: Regional | Site: Toe | Laterality: Right

## 2023-03-01 MED ORDER — METRONIDAZOLE 500 MG/100ML IV SOLN
500.0000 mg | Freq: Three times a day (TID) | INTRAVENOUS | Status: DC
  Administered 2023-03-02: 500 mg via INTRAVENOUS
  Filled 2023-03-01: qty 100

## 2023-03-01 MED ORDER — CEFAZOLIN SODIUM-DEXTROSE 2-4 GM/100ML-% IV SOLN: 2.0000 g | Freq: Three times a day (TID) | INTRAVENOUS | Status: DC

## 2023-03-01 MED ORDER — DEXAMETHASONE SODIUM PHOSPHATE 10 MG/ML IJ SOLN
INTRAMUSCULAR | Status: DC | PRN
  Administered 2023-03-01: 5 mg via INTRAVENOUS

## 2023-03-01 MED ORDER — HYDROMORPHONE HCL 1 MG/ML IJ SOLN
1.0000 mg | Freq: Once | INTRAMUSCULAR | Status: AC
  Administered 2023-03-01: 1 mg via INTRAVENOUS

## 2023-03-01 MED ORDER — BUPIVACAINE HCL (PF) 0.5 % IJ SOLN
INTRAMUSCULAR | Status: DC | PRN
  Administered 2023-03-01: 40 mL

## 2023-03-01 MED ORDER — OXYCODONE HCL 5 MG PO TABS: 5.0000 mg | ORAL_TABLET | Freq: Once | ORAL | Status: DC | PRN

## 2023-03-01 MED ORDER — OXYCODONE HCL 5 MG/5ML PO SOLN: 5.0000 mg | Freq: Once | ORAL | Status: DC | PRN

## 2023-03-01 MED ORDER — ONDANSETRON HCL 4 MG/2ML IJ SOLN
INTRAMUSCULAR | Status: DC | PRN
  Administered 2023-03-01: 4 mg via INTRAVENOUS

## 2023-03-01 MED ORDER — HYDROMORPHONE HCL 1 MG/ML IJ SOLN
1.0000 mg | Freq: Once | INTRAMUSCULAR | Status: AC
  Administered 2023-03-01: 1 mg via INTRAVENOUS
  Filled 2023-03-01: qty 1

## 2023-03-01 MED ORDER — HYDROMORPHONE HCL 1 MG/ML IJ SOLN
1.0000 mg | Freq: Once | INTRAMUSCULAR | Status: DC
  Filled 2023-03-01: qty 1

## 2023-03-01 MED ORDER — CEFAZOLIN SODIUM-DEXTROSE 1-4 GM/50ML-% IV SOLN
INTRAVENOUS | Status: DC | PRN
  Administered 2023-03-01: 1 g via INTRAVENOUS

## 2023-03-01 MED ORDER — PROMETHAZINE HCL 25 MG/ML IJ SOLN: 6.2500 mg | INTRAMUSCULAR | Status: DC | PRN

## 2023-03-01 MED ORDER — MIDAZOLAM HCL 2 MG/2ML IJ SOLN
INTRAMUSCULAR | Status: DC | PRN
  Administered 2023-03-01: 1 mg via INTRAVENOUS

## 2023-03-01 MED ORDER — METRONIDAZOLE 500 MG/100ML IV SOLN: 500.0000 mg | Freq: Three times a day (TID) | INTRAVENOUS | Status: DC

## 2023-03-01 MED ORDER — MIDAZOLAM HCL 2 MG/2ML IJ SOLN
INTRAMUSCULAR | Status: AC
  Filled 2023-03-01: qty 2

## 2023-03-01 MED ORDER — VANCOMYCIN HCL 1000 MG IV SOLR
INTRAVENOUS | Status: AC
  Filled 2023-03-01: qty 20

## 2023-03-01 MED ORDER — LIDOCAINE HCL (PF) 1 % IJ SOLN
5.0000 mL | Freq: Once | INTRAMUSCULAR | Status: AC
  Administered 2023-03-01: 5 mL
  Filled 2023-03-01: qty 5

## 2023-03-01 MED ORDER — CEFAZOLIN SODIUM-DEXTROSE 2-4 GM/100ML-% IV SOLN
2.0000 g | Freq: Three times a day (TID) | INTRAVENOUS | Status: DC
  Administered 2023-03-02 (×2): 2 g via INTRAVENOUS
  Filled 2023-03-01 (×2): qty 100

## 2023-03-01 MED ORDER — HYDROMORPHONE HCL 1 MG/ML IJ SOLN: 0.2500 mg | INTRAMUSCULAR | Status: DC | PRN

## 2023-03-01 MED ORDER — SUCCINYLCHOLINE CHLORIDE 200 MG/10ML IV SOSY
PREFILLED_SYRINGE | INTRAVENOUS | Status: DC | PRN
  Administered 2023-03-01: 140 mg via INTRAVENOUS

## 2023-03-01 MED ORDER — TETANUS-DIPHTH-ACELL PERTUSSIS 5-2.5-18.5 LF-MCG/0.5 IM SUSY
0.5000 mL | PREFILLED_SYRINGE | Freq: Once | INTRAMUSCULAR | Status: AC
  Administered 2023-03-01: 0.5 mL via INTRAMUSCULAR
  Filled 2023-03-01: qty 0.5

## 2023-03-01 MED ORDER — CEFAZOLIN SODIUM-DEXTROSE 2-4 GM/100ML-% IV SOLN
2.0000 g | INTRAVENOUS | Status: AC
  Administered 2023-03-01: 2 g via INTRAVENOUS
  Filled 2023-03-01: qty 100

## 2023-03-01 MED ORDER — PROPOFOL 10 MG/ML IV BOLUS
INTRAVENOUS | Status: DC | PRN
  Administered 2023-03-01: 150 mg via INTRAVENOUS

## 2023-03-01 MED ORDER — MEPERIDINE HCL 25 MG/ML IJ SOLN: 6.2500 mg | INTRAMUSCULAR | Status: DC | PRN

## 2023-03-01 MED ORDER — SODIUM CHLORIDE 0.9 % IR SOLN
Status: DC | PRN
  Administered 2023-03-01: 1
  Administered 2023-03-01: 2000 mL

## 2023-03-01 MED ORDER — MIDAZOLAM HCL 2 MG/2ML IJ SOLN: 0.5000 mg | Freq: Once | INTRAMUSCULAR | Status: DC | PRN

## 2023-03-01 MED ORDER — LIDOCAINE 2% (20 MG/ML) 5 ML SYRINGE
INTRAMUSCULAR | Status: DC | PRN
  Administered 2023-03-01: 20 mg via INTRAVENOUS

## 2023-03-01 MED ORDER — SODIUM CHLORIDE 0.9 % IV SOLN: INTRAVENOUS | Status: DC | PRN

## 2023-03-01 MED ORDER — FENTANYL CITRATE (PF) 250 MCG/5ML IJ SOLN
INTRAMUSCULAR | Status: DC | PRN
  Administered 2023-03-01: 50 ug via INTRAVENOUS

## 2023-03-01 MED ORDER — PROPOFOL 10 MG/ML IV BOLUS
INTRAVENOUS | Status: AC
  Filled 2023-03-01: qty 20

## 2023-03-01 MED ORDER — FENTANYL CITRATE (PF) 250 MCG/5ML IJ SOLN
INTRAMUSCULAR | Status: AC
  Filled 2023-03-01: qty 5

## 2023-03-01 SURGICAL SUPPLY — 73 items
ANCHOR SUT KEITH ABD SZ2 STR (SUTURE) ×1 IMPLANT
BAG COUNTER SPONGE SURGICOUNT (BAG) IMPLANT
BANDAGE ESMARK 6X9 LF (GAUZE/BANDAGES/DRESSINGS) ×1 IMPLANT
BLADE LONG MED 31X9 (MISCELLANEOUS) ×1 IMPLANT
BLADE OSCILLATING /SAGITTAL (BLADE) IMPLANT
BLADE SURG 15 STRL LF DISP TIS (BLADE) ×2 IMPLANT
BLADE SURG 15 STRL SS (BLADE) ×2
BNDG COHESIVE 4X5 TAN STRL LF (GAUZE/BANDAGES/DRESSINGS) ×1 IMPLANT
BNDG COHESIVE 6X5 TAN NS LF (GAUZE/BANDAGES/DRESSINGS) ×1 IMPLANT
BNDG COHESIVE 6X5 TAN ST LF (GAUZE/BANDAGES/DRESSINGS) ×1 IMPLANT
BNDG ELASTIC 4X5.8 VLCR STR LF (GAUZE/BANDAGES/DRESSINGS) ×1 IMPLANT
BNDG ELASTIC 6X5.8 VLCR STR LF (GAUZE/BANDAGES/DRESSINGS) ×1 IMPLANT
BNDG ESMARK 6X9 LF (GAUZE/BANDAGES/DRESSINGS) ×1
BNDG GAUZE DERMACEA FLUFF 4 (GAUZE/BANDAGES/DRESSINGS) ×1 IMPLANT
CHLORAPREP W/TINT 26 (MISCELLANEOUS) ×2 IMPLANT
COVER SURGICAL LIGHT HANDLE (MISCELLANEOUS) ×1 IMPLANT
CUFF TOURN SGL QUICK 34 (TOURNIQUET CUFF) ×1
CUFF TRNQT CYL 34X4.125X (TOURNIQUET CUFF) ×1 IMPLANT
DRAPE C-ARM 42X120 X-RAY (DRAPES) IMPLANT
DRAPE C-ARM MINI 42X72 WSTRAPS (DRAPES) ×1 IMPLANT
DRAPE C-ARMOR (DRAPES) ×1 IMPLANT
DRAPE EXTREMITY T 121X128X90 (DISPOSABLE) ×1 IMPLANT
DRAPE OEC MINIVIEW 54X84 (DRAPES) ×1 IMPLANT
DRAPE ORTHO SPLIT 77X108 STRL (DRAPES) ×2
DRAPE SURG ORHT 6 SPLT 77X108 (DRAPES) ×2 IMPLANT
DRAPE U-SHAPE 47X51 STRL (DRAPES) ×1 IMPLANT
DRSG ADAPTIC 3X8 NADH LF (GAUZE/BANDAGES/DRESSINGS) ×1 IMPLANT
ELECT REM PT RETURN 15FT ADLT (MISCELLANEOUS) ×1 IMPLANT
FACESHIELD WRAPAROUND (MASK) ×1 IMPLANT
FACESHIELD WRAPAROUND OR TEAM (MASK) ×1 IMPLANT
GAUZE PAD ABD 8X10 STRL (GAUZE/BANDAGES/DRESSINGS) ×5 IMPLANT
GAUZE SPONGE 4X4 12PLY STRL (GAUZE/BANDAGES/DRESSINGS) ×2 IMPLANT
GAUZE SPONGE 4X4 16PLY XRAY LF (GAUZE/BANDAGES/DRESSINGS) IMPLANT
GAUZE XEROFORM 5X9 LF (GAUZE/BANDAGES/DRESSINGS) ×1 IMPLANT
GLOVE BIO SURGEON STRL SZ7 (GLOVE) IMPLANT
GLOVE BIOGEL PI IND STRL 7.5 (GLOVE) IMPLANT
GLOVE SURG ENC MOIS LTX SZ7.5 (GLOVE) ×2 IMPLANT
GLOVE SURG MICRO LTX SZ7.5 (GLOVE) ×2 IMPLANT
GLOVE SURG POLYISO LF SZ7.5 (GLOVE) ×1 IMPLANT
GLOVE SURG UNDER POLY LF SZ7.5 (GLOVE) ×2 IMPLANT
GOWN STRL REUS W/ TWL LRG LVL3 (GOWN DISPOSABLE) ×1 IMPLANT
GOWN STRL REUS W/TWL LRG LVL3 (GOWN DISPOSABLE) ×1
KIT BASIN OR (CUSTOM PROCEDURE TRAY) ×1 IMPLANT
KIT TURNOVER KIT A (KITS) IMPLANT
NDL MAYO CATGUT SZ4 TPR NDL (NEEDLE) IMPLANT
NEEDLE HYPO 22GX1.5 SAFETY (NEEDLE) ×1 IMPLANT
NEEDLE MAYO CATGUT SZ4 (NEEDLE) IMPLANT
NS IRRIG 1000ML POUR BTL (IV SOLUTION) ×1 IMPLANT
PACK ORTHO EXTREMITY (CUSTOM PROCEDURE TRAY) ×1 IMPLANT
PAD CAST 4YDX4 CTTN HI CHSV (CAST SUPPLIES) ×6 IMPLANT
PADDING CAST COTTON 4X4 STRL (CAST SUPPLIES) ×6
PADDING CAST COTTON 6X4 STRL (CAST SUPPLIES) ×1 IMPLANT
PROTECTOR NERVE ULNAR (MISCELLANEOUS) ×1 IMPLANT
SET IRRIG Y TYPE TUR BLADDER L (SET/KITS/TRAYS/PACK) IMPLANT
SPIKE FLUID TRANSFER (MISCELLANEOUS) IMPLANT
SPONGE T-LAP 18X18 ~~LOC~~+RFID (SPONGE) ×1 IMPLANT
STAPLER VISISTAT 35W (STAPLE) ×1 IMPLANT
STOCKINETTE IMPERVIOUS 9X36 MD (GAUZE/BANDAGES/DRESSINGS) IMPLANT
STOCKINETTE TUBULAR 6 INCH (GAUZE/BANDAGES/DRESSINGS) ×1 IMPLANT
STOCKINETTE TUBULAR COTT 4X25 (GAUZE/BANDAGES/DRESSINGS) ×1 IMPLANT
STRIP CLOSURE SKIN 1/2X4 (GAUZE/BANDAGES/DRESSINGS) ×1 IMPLANT
SUCTION FRAZIER HANDLE 10FR (MISCELLANEOUS) ×1
SUCTION TUBE FRAZIER 10FR DISP (MISCELLANEOUS) ×1 IMPLANT
SUT ETHILON 3 0 PS 1 (SUTURE) ×1 IMPLANT
SUT MON AB 3-0 SH 27 (SUTURE) ×1
SUT MON AB 3-0 SH27 (SUTURE) ×1 IMPLANT
SUT PROLENE 2 0 CT2 30 (SUTURE) IMPLANT
SUT VIC AB 2-0 SH 27 (SUTURE) ×1
SUT VIC AB 2-0 SH 27XBRD (SUTURE) ×1 IMPLANT
SUT VIC AB 3-0 SH 27 (SUTURE) ×2
SUT VIC AB 3-0 SH 27X BRD (SUTURE) ×2 IMPLANT
SYR CONTROL 10ML LL (SYRINGE) ×1 IMPLANT
WATER STERILE IRR 1000ML POUR (IV SOLUTION) ×1 IMPLANT

## 2023-03-01 NOTE — Op Note (Signed)
03/01/2023  11:44 PM   PATIENT: [name]  67 y.o. female  MRN: [id number]   PRE-OPERATIVE DIAGNOSIS:   Right great toe traumatic amputation (lawnmower)   POST-OPERATIVE DIAGNOSIS:   Same   PROCEDURE: Right great toe amputation thru the level of the proximal phalanx (IP joint) with I&D and primary closure   SURGEON:  Armond Hang, MD   ASSISTANT: None   ANESTHESIA: General, regional   EBL: 3 cc   TOURNIQUET:   None used   COMPLICATIONS: None apparent   DISPOSITION: Extubated, awake and stable to recovery.   INDICATION FOR PROCEDURE: The patient presented with above diagnosis.  We discussed the diagnosis, alternative treatment options, risks and benefits of the above surgical intervention, as well as alternative non-operative treatments. All questions/concerns were addressed and the patient/family demonstrated appropriate understanding of the diagnosis, the procedure, the postoperative course, and overall prognosis. The patient wished to proceed with surgical intervention and signed an informed surgical consent as such, in each others presence prior to surgery.   PROCEDURE IN DETAIL: After preoperative consent was obtained and the correct operative site was identified, the patient was brought to the operating room supine on stretcher and transferred onto operating table. General anesthesia was induced. Preoperative antibiotics were administered. Surgical timeout was taken. The patient was then positioned supine with an ipsilateral hip bump. The operative lower extremity was prepped and draped in standard sterile fashion. No tourniquet was used.  We began by careful examination of the right great toe amputation stump. There was noted to be extensive degloving and partial traumatic avulsion of the right great toe nail. This nail was carefully removed using freer elevator. Several loose pieces of bone from the distal phalanx were resected as they were non  viable and too small for fixation. We then performed extensive irrigation and debridement of the right foot with 6+ liters of normal saline. Numerous pieces of grass and dirt were removed from the stump site. We removed remaining distal phalanx shard articulating at the IP joint and thereby completed the amputation.  Betadine solution and vancomycin powder applied. Healthy bleeding was noted at the stump site. The skin was loosely approximated without tension using 2-0 prolene suture.    The leg was cleaned with saline and sterile dressings with gauze were applied. A well padded postop shoe was applied. The patient was awakened from anesthesia and transported to the recovery room in stable condition.    FOLLOW UP PLAN: -transfer to PACU, then admit for observation overnight -strict heel weightbearing in postop shoe operative extremity, maximum elevation -daily dry dressings until follow up -DVT ppx: Aspirin 81 mg twice daily x 4 wk -Abx: postop open fracture protocol and then homegoing abx given extensive gross contamination -follow up as outpatient within 7 days for wound check -sutures out in 3 weeks in outpatient office   RADIOGRAPHS: AP, lateral, oblique radiographs of the right foot were obtained intraoperatively. These showed interval amputation of the right great toe at the level of the IP joint. No other acute injuries are noted.   Armond Hang Orthopaedic Surgery [facility]

## 2023-03-01 NOTE — Transfer of Care (Signed)
Immediate Anesthesia Transfer of Care Note  Patient: [name]  Procedure(s) Performed: PARTIAL FOOT AMPUTATION (Right: Toe)  Patient Location: PACU  Anesthesia Type:General and Regional  Level of Consciousness: awake, alert , and oriented  Airway & Oxygen Therapy: Patient Spontanous Breathing and Patient connected to nasal cannula oxygen  Post-op Assessment: Report given to RN and Post -op Vital signs reviewed and stable  Post vital signs: Reviewed and stable  Last Vitals:  Vitals Value Taken Time  BP 158/85 03/01/23 2300  Temp    Pulse 92 03/01/23 2303  Resp 14 03/01/23 2303  SpO2 93 % 03/01/23 2303  Vitals shown include unvalidated device data.  Last Pain:  Vitals:   03/01/23 1840  TempSrc: Oral  PainSc:          Complications: No notable events documented.

## 2023-03-01 NOTE — ED Notes (Signed)
Xray re-notified or portable film order

## 2023-03-01 NOTE — Anesthesia Procedure Notes (Signed)
Anesthesia Regional Block: Ankle block   Pre-Anesthetic Checklist: , timeout performed,  Correct Patient, Correct Site, Correct Laterality,  Correct Procedure,, site marked,  Risks and benefits discussed,  Surgical consent,  Pre-op evaluation,  At surgeon's request and post-op pain management  Laterality: Right and Lower  Prep: chloraprep       Needles:  Injection technique: Single-shot  Needle Type: Quincke      Needle Gauge: 25     Additional Needles:   Narrative:  Start time: 03/01/2023 9:29 PM End time: 03/01/2023 9:35 PM Injection made incrementally with aspirations every 5 mL.  Performed by: Personally   Additional Notes: Patient identified in Holding Room. Routine monitors on.  IV access established.  Timeout performed. Sterile prep and drape R ankle.  Perineural infiltration of local anesthetic incrementally with #25ga needle around deep and superficial peroneal, saphenous, posterior tibial, and sural nerves.  No heme aspirated. Patient asymptomatic. VSS.  Patient tolerated well.  Total 40cc 0.5% Bupivacaine used.  Jenita Seashore, MD

## 2023-03-01 NOTE — H&P (Addendum)
ORTHOPAEDIC SURGERY H&P  Subjective:  The patient presents with traumatic right great toe amputation sustained on 03/01/23. Patient was riding a lawnmower that went over pavement which shifted her body weight in her right foot slipped off the machine causing an amputation. Per ER physician, the patient then walked into her house and stepped in dog feces. She underwent bedside I&D in ER. She does not take any blood thinning medication. Does not know when her last tetanus shot was. Patient given pain medicine and foot dressed prior to arrival. Patient denies any other injury. She underwent ipsilateral right Achilles repair couple years ago. Accompanied at bedside by daughter and granddaughter. 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   VENTRAL HERNIA REPAIR  08/04/2012   Procedure: LAPAROSCOPIC VENTRAL HERNIA;  Surgeon: Odis Hollingshead, MD;  Location: [facility];  Service: General;  Laterality: N/A;  laparoscopic possible open ventral hernia repair with mesh     (Not in an outpatient encounter)    No Known  Allergies  Social History   Socioeconomic History   Marital status: Divorced    Spouse name: Not on file   Number of children: 2   Years of education: Not on file   Highest education level: Not on file  Occupational History   Occupation: Animal nutritionist    Employer: [employer]  Tobacco Use   Smoking status: Former    Years: 4    Types: Cigarettes    Quit date: 12/02/1994    Years since quitting: 28.2   Smokeless tobacco: Never   Tobacco comments:    08/04/2012 "quit smoking cigarettes 15-20 yr ago; really a recreational thing"  Substance and Sexual Activity   Alcohol use: Yes    Alcohol/week: 4.0 standard drinks of alcohol    Types: 4 Glasses of wine per week   Drug use: No   Sexual activity: Yes    Birth control/protection: Surgical, Post-menopausal  Other Topics Concern   Not on file  Social History Narrative   Marital status: divorced; dating seriously in 2019 for 26 years      Children:  2 children; 9 grandchildren; 7 gg.      Lives: with mother      Employment: Animal nutritionist full time x 13 years      Tobacco: none      Alcohol: none  Drugs: none      Exercise: job physically active      Sexual activity: yes; same partner for years.   Social Determinants of Health   Financial Resource Strain: Not on file  Food Insecurity: Not on file  Transportation Needs: Not on file  Physical Activity: Not on file  Stress: Not on file  Social Connections: Not on file  Intimate Partner Violence: Not on file     History reviewed. No pertinent family history.   Review of Systems Pertinent items are noted in HPI.  Objective: Vital signs in last 24 hours:    03/01/2023    8:26 PM 03/01/2023    6:40 PM 12/07/2020    5:00 PM  Vitals with BMI  Systolic [id number]  Diastolic 83 96 75  Pulse 87 95 93      EXAM: General: Well nourished, well developed. Awake, alert and oriented to time, place, person. Normal mood and affect. No apparent distress.  Breathing room air.  Right Lower Extremity: Tenderness to palpation - right great toe stump Partial traumatic amp of right great toe with avulsed toe nail SILT, motor intact throughout Palpable DP and PT pulses Special testing: None  The contralateral foot/ankle was examined for comparison and noted to be neurovascularly intact with no localized deformity, swelling, or tenderness.  Imaging Review Images taken were independently reviewed by me demonstrating amputation of the base of the distal phalanx of the great toe .  Assessment/Plan: The clinical and radiographic findings were reviewed and discussed at length with the patient.  The patient has traumatic right great toe amputation sustained on 03/01/23 from lawnmower injury.  We spoke at length about the natural course of these findings. We discussed nonoperative and operative treatment options in detail.  I have recommended the following: Surgery in the form of right great toe partial vs total amputation, possible removal of right great toe nail. We specifically discussed likely need to amputate thru the proximal phalanx or even 1st MTP joint to achieve wound closure. The risks and benefits were presented and reviewed. The risks due to loss of toe nail, infection, stiffness, nerve/vessel/tendon injury, wound healing issues, failure of this surgery, failure/irritation secondary to any implanted suture and/or hardware, need for further surgery, thromboembolic events, amputation, death among others were discussed. The patient acknowledged the explanation & agreed to proceed with the plan.  Armond Hang  Orthopaedic Surgery [facility]

## 2023-03-01 NOTE — H&P (Signed)
H&P Update:  -History and Physical Reviewed  -Patient has been re-examined  -No change in the plan of care  -The risks and benefits were presented and reviewed. The risks due to loss of right great toe nail, hardware/suture failure and/or irritation, new/persistent infection, stiffness, nerve/vessel/tendon injury or rerupture of repaired tendon, nonunion/malunion, allograft usage, wound healing issues, development of arthritis, failure of this surgery, possibility of external fixation with delayed definitive surgery, need for further surgery, thromboembolic events, anesthesia/medical complications, amputation, death among others were discussed. The patient acknowledged the explanation, agreed to proceed with the plan and a consent was signed.  Armond Hang

## 2023-03-01 NOTE — ED Triage Notes (Addendum)
BIB [facility] from home s/p lawn mower injury consisting of isolated R great toe amputation. Was driving riding lawnmower, went over pavement, stopped engine to idle but did not stop blade when she got off, R great toe missing. Dressing in place. Bleeding controlled. NSL 20g L AC. Fentanyl 166mcg and versed 2.5mg . Bleeding controlled with bath towel. C/o pain and nausea. Very anxious. Alert, appropriate, interactive. Td unknown. Incontinent of stool PTA.

## 2023-03-01 NOTE — Anesthesia Procedure Notes (Signed)
Procedure Name: Intubation Date/Time: 03/01/2023 9:54 PM  Performed by: Trinna Post., CRNAPre-anesthesia Checklist: Patient identified, Emergency Drugs available, Suction available and Patient being monitored Patient Re-evaluated:Patient Re-evaluated prior to induction Oxygen Delivery Method: Circle system utilized Preoxygenation: Pre-oxygenation with 100% oxygen Induction Type: IV induction, Rapid sequence and Cricoid Pressure applied Ventilation: Mask ventilation without difficulty Laryngoscope Size: Mac and 3 Grade View: Grade II Tube type: Oral Tube size: 7.0 mm Number of attempts: 1 Airway Equipment and Method: Stylet and Oral airway Placement Confirmation: ETT inserted through vocal cords under direct vision, positive ETCO2 and breath sounds checked- equal and bilateral Secured at: 21 cm Tube secured with: Tape Dental Injury: Teeth and Oropharynx as per pre-operative assessment

## 2023-03-01 NOTE — Progress Notes (Addendum)
Orthopedic Tech Progress Note Patient Details:  [name] 23-Apr-1956 [id number]  Ortho Devices Type of Ortho Device: Postop shoe/boot Ortho Device/Splint Location: delivered to or Ortho Device/Splint Interventions: Ordered  Requested by or rn.    Karolee Stamps 03/01/2023, 10:20 PM

## 2023-03-01 NOTE — ED Notes (Signed)
Patient transported to OR with RN

## 2023-03-01 NOTE — ED Provider Notes (Signed)
[facility] Provider Note   CSN: [id number] Arrival date & time: 03/01/23  1821     History  Chief Complaint  Patient presents with   Extremity Laceration    [name] is a 67 y.o. female.  HPI   67 year old female presents emergency department with right great toe amputation.  Patient was riding a lawnmower that went over pavement which shifted her body weight in her right foot slipped off the machine causing an amputation.  She does not take any blood thinning medication.  Does not know when her last tetanus shot was.  Patient given pain medicine and foot dressed prior to arrival.  Patient denies any other injury.  Home Medications Prior to Admission medications   Medication Sig Start Date End Date Taking? Authorizing Provider  azithromycin (ZITHROMAX) 250 MG tablet Take 1 tablet (250 mg total) by mouth daily. Take first 2 tablets together, then 1 every day until finished. 09/02/19   Bast, Tressia Miners A, NP  benzonatate (TESSALON) 100 MG capsule Take 1 capsule (100 mg total) by mouth every 8 (eight) hours. 09/02/19   Loura Halt A, NP  DM-Doxylamine-Acetaminophen (NYQUIL COLD & FLU PO) Take 30 mLs by mouth at bedtime as needed (sleep/pain).    [provider]  doxycycline (VIBRAMYCIN) 100 MG capsule Take 1 capsule (100 mg total) by mouth 2 (two) times daily. 12/07/20   Vanessa Kick, MD  HYDROcodone-acetaminophen (NORCO/VICODIN) 5-325 MG tablet Take 1 tablet by mouth every 6 (six) hours as needed for moderate pain or severe pain. 12/07/20   Vanessa Kick, MD  ondansetron (ZOFRAN) 4 MG tablet Take 1 tablet (4 mg total) by mouth every 6 (six) hours. 02/01/19   Orpah Greek, MD  amphetamine-dextroamphetamine (ADDERALL XR) 25 MG 24 hr capsule Take 1 capsule by mouth every morning. Patient not taking: Reported on 02/01/2019 02/26/18 09/02/19  Wardell Honour, MD  QUEtiapine (SEROQUEL) 25 MG tablet TAKE 1 TABLET(25 MG) BY MOUTH AT  BEDTIME Patient not taking: Reported on 02/01/2019 02/16/18 09/02/19  Wardell Honour, MD      Allergies    Patient has no known allergies.    Review of Systems   Review of Systems  Respiratory:  Negative for shortness of breath.   Cardiovascular:  Negative for chest pain.  Gastrointestinal:  Negative for abdominal pain.  Musculoskeletal:  Negative for back pain and neck pain.  Neurological:  Negative for headaches.    Physical Exam Updated Vital Signs BP (!) 189/96   Pulse 95   Temp 97.6 F (36.4 C) (Oral)   Resp (!) 25   SpO2 97%  Physical Exam Constitutional:      General: She is in acute distress.  HENT:     Head: Normocephalic.  Cardiovascular:     Rate and Rhythm: Tachycardia present.  Musculoskeletal:     Comments: Amputation of the right great toe with the toenail still intact, please refer to pictures.  Intact DP pulses, no active/pulsatile bleeding.  Psychiatric:     Comments: tearful              ED Results / Procedures / Treatments   Labs (all labs ordered are listed, but only abnormal results are displayed) Labs Reviewed  CBC  COMPREHENSIVE METABOLIC PANEL  ETHANOL  URINALYSIS, ROUTINE W REFLEX MICROSCOPIC  LACTIC ACID, PLASMA  PROTIME-INR  I-STAT CHEM 8, ED  SAMPLE TO BLOOD BANK    EKG None  Radiology No results found.  Procedures .Nerve Block  Date/Time: 03/01/2023 9:03 PM  Performed by: Lorelle Gibbs, DO Authorized by: Lorelle Gibbs, DO   Consent:    Consent obtained:  Verbal   Consent given by:  Patient   Risks discussed:  Allergic reaction, nerve damage, swelling and unsuccessful block   Alternatives discussed:  No treatment Universal protocol:    Patient identity confirmed:  Verbally with patient Indications:    Indications:  Pain relief Location:    Body area:  Lower extremity   Laterality:  Right Pre-procedure details:    Skin preparation:  Povidone-iodine Skin anesthesia:    Skin anesthesia method:   None Procedure details:    Block needle gauge:  25 G   Anesthetic injected:  Lidocaine 1% w/o epi   Steroid injected:  None   Additive injected:  None   Injection procedure:  Negative aspiration for blood and anatomic landmarks palpated   Paresthesia:  None Post-procedure details:    Dressing:  None   Outcome:  Anesthesia achieved   Procedure completion:  Tolerated     Medications Ordered in ED Medications  ceFAZolin (ANCEF) IVPB 2g/100 mL premix (has no administration in time range)  Tdap (BOOSTRIX) injection 0.5 mL (0.5 mLs Intramuscular Given 03/01/23 1851)  HYDROmorphone (DILAUDID) injection 1 mg (1 mg Intravenous Given 03/01/23 1846)    ED Course/ Medical Decision Making/ A&P                             Medical Decision Making Amount and/or Complexity of Data Reviewed Labs: ordered. Radiology: ordered.  Risk Prescription drug management. Decision regarding hospitalization.   67 year old female presents emergency department with amputation of the right great toe by lawnmower.  Not on blood thinners, initial vital signs are stable, mild active bleeding but no pulsatile bleeding.  Tetanus updated, antibiotics ordered, digital block performed for pain control.  X-ray confirms amputation at the base of the great toe.  Spoke with on-call orthopedic surgeon who will plan to admit and take to the OR for washout/amputation completion.  Patient and family at bedside have been updated on this plan.  Currently pain is controlled.  Patient will be admitted to the OR.        Final Clinical Impression(s) / ED Diagnoses Final diagnoses:  None    Rx / DC Orders ED Discharge Orders     None         Lorelle Gibbs, DO 03/01/23 2104

## 2023-03-01 NOTE — Discharge Instructions (Signed)
Michelle Hang, MD [facility]  Please read the following information regarding your care after surgery.  Medications  You only need a prescription for the narcotic pain medicine (ex. oxycodone, Percocet, Norco).  All of the other medicines listed below are available over the counter. ? Aleve 2 pills twice a day for the first 3 days after surgery. ? acetominophen (Tylenol) 650 mg every 4-6 hours as you need for minor to moderate pain ? oxycodone as prescribed for severe pain  ? To help prevent blood clots, take aspirin (81 mg) twice daily for 28 days after surgery.  You should also get up every hour while you are awake to move around.  Weight Bearing ? OK to heel weight bear in postop shoe on the operated leg or foot. This means do NOT touch the front of your surgical foot to the ground!  Cast / Splint / Dressing ? If you have a splint, do NOT remove this. Keep your splint, cast or dressing clean and dry.  Don't put anything (coat hanger, pencil, etc) down inside of it.  If it gets wet, call the office immediately to schedule an appointment for a cast change.  Swelling IMPORTANT: It is normal for you to have swelling where you had surgery. To reduce swelling and pain, keep at least 3 pillows under your leg so that your toes are above your nose and your heel is above the level of your hip.  It may be necessary to keep your foot or leg elevated for several weeks.  This is critical to helping your incisions heal and your pain to feel better.  Follow Up Call my office at [phone] when you are discharged from the hospital or surgery center to schedule an appointment to be seen 7-10 days after surgery.  Call my office at [phone] if you develop a fever >101.5 F, nausea, vomiting, bleeding from the surgical site or severe pain.

## 2023-03-01 NOTE — Anesthesia Preprocedure Evaluation (Addendum)
Anesthesia Evaluation  Patient identified by MRN, date of birth, ID band Patient awake    Reviewed: Allergy & Precautions, NPO status , Patient's Chart, lab work & pertinent test results  History of Anesthesia Complications Negative for: history of anesthetic complications  Airway Mallampati: I  TM Distance: >3 FB Neck ROM: Full    Dental  (+) Dental Advisory Given, Partial Upper, Partial Lower, Caps   Pulmonary former smoker   Pulmonary exam normal        Cardiovascular hypertension (no longer on medication),  Rhythm:Regular Rate:Normal     Neuro/Psych  PSYCHIATRIC DISORDERS (ADHD) Anxiety     negative neurological ROS     GI/Hepatic hiatal hernia,GERD  Controlled and Medicated,,(+)     substance abuse  alcohol use, Hepatitis -, CMultiple bowel surgeries   Endo/Other  negative endocrine ROS    Renal/GU negative Renal ROS     Musculoskeletal   Abdominal   Peds  Hematology negative hematology ROS (+)   Anesthesia Other Findings Lawnmower injury to great toe  Reproductive/Obstetrics                             Anesthesia Physical Anesthesia Plan  ASA: 2 and emergent  Anesthesia Plan: General   Post-op Pain Management: Regional block* and Ofirmev IV (intra-op)*   Induction: Intravenous  PONV Risk Score and Plan: 3 and Ondansetron, Dexamethasone and Treatment may vary due to age or medical condition  Airway Management Planned: Oral ETT  Additional Equipment: None  Intra-op Plan:   Post-operative Plan: Extubation in OR  Informed Consent: I have reviewed the patients History and Physical, chart, labs and discussed the procedure including the risks, benefits and alternatives for the proposed anesthesia with the patient or authorized representative who has indicated his/her understanding and acceptance.     Dental advisory given  Plan Discussed with: CRNA and  Surgeon  Anesthesia Plan Comments: (Plan routine monitors, GETA with ankle block for post op analgesia)        Anesthesia Quick Evaluation

## 2023-03-01 NOTE — ED Notes (Signed)
EDP notified of Critical Lab Values   Lactic Acid 2.1

## 2023-03-01 NOTE — Anesthesia Postprocedure Evaluation (Signed)
Anesthesia Post Note  Patient: [name]  Procedure(s) Performed: PARTIAL FOOT AMPUTATION (Right: Toe)     Patient location during evaluation: PACU Anesthesia Type: Regional Level of consciousness: awake and alert, patient cooperative and oriented Pain management: pain level controlled Vital Signs Assessment: post-procedure vital signs reviewed and stable Respiratory status: spontaneous breathing, nonlabored ventilation and respiratory function stable Cardiovascular status: blood pressure returned to baseline and stable Postop Assessment: no apparent nausea or vomiting and adequate PO intake Anesthetic complications: no   No notable events documented.  Last Vitals:  Vitals:   03/01/23 2300 03/01/23 2315  BP: (!) 158/85 (!) 171/86  Pulse: 97 86  Resp: 16 15  Temp: (!) 36.1 C   SpO2: 94% 92%    Last Pain:  Vitals:   03/01/23 2315  TempSrc:   PainSc: 0-No pain                 Dwyne Hasegawa,E. Hulon Ferron

## 2023-03-02 ENCOUNTER — Other Ambulatory Visit: Payer: Self-pay

## 2023-03-02 ENCOUNTER — Encounter (HOSPITAL_COMMUNITY): Payer: Self-pay | Admitting: Orthopaedic Surgery

## 2023-03-02 DIAGNOSIS — Z9889 Other specified postprocedural states: Secondary | ICD-10-CM

## 2023-03-02 LAB — HIV ANTIBODY (ROUTINE TESTING W REFLEX): HIV Screen 4th Generation wRfx: NONREACTIVE

## 2023-03-02 MED ORDER — SENNOSIDES-DOCUSATE SODIUM 8.6-50 MG PO TABS: 1.0000 | ORAL_TABLET | Freq: Every evening | ORAL | Status: DC | PRN

## 2023-03-02 MED ORDER — SODIUM CHLORIDE 0.9 % IV SOLN: INTRAVENOUS | Status: DC

## 2023-03-02 MED ORDER — ONDANSETRON HCL 4 MG PO TABS: 4.0000 mg | ORAL_TABLET | Freq: Four times a day (QID) | ORAL | Status: DC | PRN

## 2023-03-02 MED ORDER — ACETAMINOPHEN 500 MG PO TABS
500.0000 mg | ORAL_TABLET | Freq: Four times a day (QID) | ORAL | Status: DC
  Administered 2023-03-02: 500 mg via ORAL
  Filled 2023-03-02 (×2): qty 1

## 2023-03-02 MED ORDER — HYDROCODONE-ACETAMINOPHEN 5-325 MG PO TABS
1.0000 | ORAL_TABLET | ORAL | Status: DC | PRN
  Administered 2023-03-02: 2 via ORAL
  Filled 2023-03-02: qty 2

## 2023-03-02 MED ORDER — HYDROCODONE-ACETAMINOPHEN 7.5-325 MG PO TABS
1.0000 | ORAL_TABLET | ORAL | Status: DC | PRN
  Administered 2023-03-02: 1 via ORAL
  Administered 2023-03-02: 2 via ORAL
  Filled 2023-03-02 (×2): qty 2

## 2023-03-02 MED ORDER — MORPHINE SULFATE (PF) 2 MG/ML IV SOLN
0.5000 mg | INTRAVENOUS | Status: DC | PRN
  Administered 2023-03-02: 0.5 mg via INTRAVENOUS
  Filled 2023-03-02: qty 1

## 2023-03-02 MED ORDER — ACETAMINOPHEN 325 MG PO TABS: 325.0000 mg | ORAL_TABLET | Freq: Four times a day (QID) | ORAL | Status: DC | PRN

## 2023-03-02 MED ORDER — ONDANSETRON HCL 4 MG/2ML IJ SOLN: 4.0000 mg | Freq: Four times a day (QID) | INTRAMUSCULAR | Status: DC | PRN

## 2023-03-02 MED ORDER — GABAPENTIN 300 MG PO CAPS: 300.0000 mg | ORAL_CAPSULE | Freq: Three times a day (TID) | ORAL | 0 refills | Status: DC

## 2023-03-02 MED ORDER — DOCUSATE SODIUM 100 MG PO CAPS
100.0000 mg | ORAL_CAPSULE | Freq: Two times a day (BID) | ORAL | Status: DC
  Administered 2023-03-02: 100 mg via ORAL
  Filled 2023-03-02 (×2): qty 1

## 2023-03-02 NOTE — Evaluation (Signed)
Physical Therapy Evaluation Patient Details Name: [name] MRN: [id number] DOB: 05-18-1956 Today's Date: 03/02/2023  History of Present Illness  67 y.o. 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 Pt underwent I&D and R great to amputation with closure in OR on 3/30. PMH includes ADHD, anxiety, OA, GERD, hepatitis C.  Clinical Impression  Pt presents to PT with deficits in activity tolerance, gait, functional mobility, balance. Pt maintains heel WB well when ambulating. Pt benefits from physical assistance to maintain stability when negotiating stairs at this time, PT with recommendation to utilize cane and hand hold when negotiating steps at home. Pt is anxious at this time, emotional about trauma, PT provides reassurance. PT recommends discharge home with walker and BSC when medically appropriate.       Recommendations for follow up therapy are one component of a multi-disciplinary discharge planning process, led by the attending physician.  Recommendations may be updated based on patient status, additional functional criteria and insurance authorization.  Follow Up Recommendations       Assistance Recommended at Discharge PRN  Patient can return home with the following  A little help with bathing/dressing/bathroom;Help with stairs or ramp for entrance;Assist for transportation;Assistance with cooking/housework    Equipment Recommendations Rolling walker (2 wheels);BSC/3in1  Recommendations for Other Services       Functional Status Assessment Patient has had a recent decline in their functional status and demonstrates the ability to make significant improvements in function in a reasonable and predictable amount of time.     Precautions / Restrictions Precautions Precautions: Fall Restrictions Weight Bearing Restrictions: Yes RLE Weight Bearing: Partial weight bearing RLE Partial Weight Bearing Percentage or Pounds:  heel weight bearing in post op shoe      Mobility  Bed Mobility Overal bed mobility: Independent                  Transfers Overall transfer level: Needs assistance Equipment used: Rolling walker (2 wheels) Transfers: Sit to/from Stand Sit to Stand: Supervision                Ambulation/Gait Ambulation/Gait assistance: Supervision Gait Distance (Feet): 100 Feet (100' x 2) Assistive device: Rolling walker (2 wheels) Gait Pattern/deviations: Step-through pattern Gait velocity: reduced Gait velocity interpretation: <1.8 ft/sec, indicate of risk for recurrent falls   General Gait Details: slowed step-through gait, pt maintaining heel WB well  Stairs Stairs: Yes Stairs assistance: Min assist Stair Management: No rails, Step to pattern, Forwards (hand hold) Number of Stairs: 3 General stair comments: PT provides suggestion for pt to utilize cane and hand hold when returning home  Wheelchair Mobility    Modified Rankin (Stroke Patients Only)       Balance Overall balance assessment: Needs assistance Sitting-balance support: No upper extremity supported, Feet supported Sitting balance-Leahy Scale: Good     Standing balance support: Single extremity supported, Reliant on assistive device for balance Standing balance-Leahy Scale: Poor                               Pertinent Vitals/Pain Pain Assessment Pain Assessment: 0-10 Pain Score: 7  Pain Location: R foot Pain Descriptors / Indicators: Burning Pain Intervention(s): Monitored during session    Home Living Family/patient expects to be discharged to:: Private residence Living Arrangements: Parent Available Help at Discharge: Family;Available PRN/intermittently;Available 24 hours/day (mom 24/7 daughter PRN) Type of Home: House Home Access: Stairs  to enter Entrance Stairs-Rails: None Entrance Stairs-Number of Steps: 3   Home Layout: One level Home Equipment: Cane - single point;Shower  seat      Prior Function Prior Level of Function : Independent/Modified Independent;Working/employed;Driving             Mobility Comments: 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 [employer]        Extremity/Trunk Assessment   Upper Extremity Assessment Upper Extremity Assessment: Overall WFL for tasks assessed    Lower Extremity Assessment Lower Extremity Assessment: Overall WFL for tasks assessed    Cervical / Trunk Assessment Cervical / Trunk Assessment: Normal  Communication   Communication: No difficulties  Cognition Arousal/Alertness: Awake/alert Behavior During Therapy: WFL for tasks assessed/performed Overall Cognitive Status: Within Functional Limits for tasks assessed                                          General Comments General comments (skin integrity, edema, etc.): VSS on RA    Exercises     Assessment/Plan    PT Assessment Patient needs continued PT services  PT Problem List Decreased activity tolerance;Decreased balance;Decreased mobility;Decreased knowledge of use of DME;Pain       PT Treatment Interventions Gait training;DME instruction;Stair training;Functional mobility training;Therapeutic activities;Balance training;Neuromuscular re-education;Patient/family education    PT Goals (Current goals can be found in the Care Plan section)  Acute Rehab PT Goals Patient Stated Goal: to go home, return to work in 2 weeks PT Goal Formulation: With patient Time For Goal Achievement: 03/16/23 Potential to Achieve Goals: Fair    Frequency Min 3X/week     Co-evaluation               AM-PAC PT "6 Clicks" Mobility  Outcome Measure Help needed turning from your back to your side while in a flat bed without using bedrails?: None Help needed moving from lying on your back to sitting on the side of a flat bed without using bedrails?: None Help needed moving to and from a bed to a chair (including a wheelchair)?: A Little Help needed  standing up from a chair using your arms (e.g., wheelchair or bedside chair)?: A Little Help needed to walk in hospital room?: A Little Help needed climbing 3-5 steps with a railing? : A Little 6 Click Score: 20    End of Session   Activity Tolerance: Patient tolerated treatment well Patient left: in bed;with call bell/phone within reach;with family/visitor present Nurse Communication: Mobility status PT Visit Diagnosis: Other abnormalities of gait and mobility (R26.89);Pain Pain - Right/Left: Right Pain - part of body: Ankle and joints of foot    Time: 0931-1005 PT Time Calculation (min) (ACUTE ONLY): 34 min   Charges:   PT Evaluation $PT Eval Low Complexity: 1 Low PT Treatments $Gait Training: 8-22 mins        Zenaida Niece, PT, DPT Acute Rehabilitation Office [phone]   Zenaida Niece 03/02/2023, 10:42 AM

## 2023-03-02 NOTE — Progress Notes (Addendum)
Subjective: 1 Day Post-Op Procedure(s) (LRB): PARTIAL FOOT AMPUTATION (Right)  Patient reports pain as appropriately controlled. Denies any new numbness/tingling.   Objective:   VITALS:  Temp:  [96.9 F (36.1 C)-98.6 F (37 C)] 98.5 F (36.9 C) (03/31 1308) Pulse Rate:  [70-97] 76 (03/31 1308) Resp:  [10-25] 17 (03/31 1308) BP: (145-189)/(72-107) 156/107 (03/31 1308) SpO2:  [92 %-98 %] 98 % (03/31 1308) Weight:  [71.7 kg] 71.7 kg (03/31 0139)  Gen: AAOx3, NAD Comfortable at rest  Right Lower Extremity: Skin intact Incision c/d/i HF/KE/KF/ADF/APF 5/5 SILT throughout DP, PT 2+ to palp CR < 2s    LABS Recent Labs    03/01/23 1845 03/01/23 1909  HGB 14.3 14.6  WBC 6.2  --   PLT 274  --    Recent Labs    03/01/23 1845 03/01/23 1909  NA 138 140  K 3.6 3.5  CL 104 104  CO2 21*  --   BUN 21 22  CREATININE 1.03* 1.20*  GLUCOSE 107* 98   Recent Labs    03/01/23 1845  INR 1.0     Assessment/Plan: 1 Day Post-Op Procedure(s) (LRB): PARTIAL FOOT AMPUTATION (Right)  -discharge home today -strict heel weightbearing in postop shoe operative extremity, maximum elevation -daily dry dressings until follow up -DVT ppx: Aspirin 81 mg twice daily x 4 wk -Abx: postop open fracture protocol and then homegoing abx given extensive gross contamination -follow up as outpatient within 7 days for wound check -sutures out in 3 weeks in outpatient office -all meds sent through outside EMR to [facility] on [address] 03/02/2023, 1:57 PM

## 2023-03-02 NOTE — TOC Transition Note (Signed)
Transition of Care [facility]) - CM/SW Discharge Note   Patient Details  Name: [name] MRN: [id number] Date of Birth: 10-Mar-1956  Transition of [facility]) CM/SW Contact:  Pollie Friar, RN Phone Number: 03/02/2023, 12:56 PM   Clinical Narrative:    Pt is discharging home with self care. No needs per TOC.   Final next level of care: Home/Self Care Barriers to Discharge: No Barriers Identified   Patient Goals and CMS Choice      Discharge Placement                         Discharge Plan and Services Additional resources added to the After Visit Summary for                                       Social Determinants of Health (SDOH) Interventions SDOH Screenings   Food Insecurity: No Food Insecurity (03/02/2023)  Housing: Low Risk  (03/02/2023)  Transportation Needs: No Transportation Needs (03/02/2023)  Utilities: Not At Risk (03/02/2023)  Tobacco Use: Medium Risk (03/02/2023)     Readmission Risk Interventions     No data to display

## 2023-03-03 ENCOUNTER — Encounter (HOSPITAL_COMMUNITY): Payer: Self-pay | Admitting: Orthopaedic Surgery

## 2023-03-10 NOTE — Discharge Summary (Addendum)
Physician Discharge Summary  Patient ID: [name] MRN: [id number] DOB/AGE: Sep 21, 1956 67 y.o.  Admit date: 03/01/2023 Discharge date: 03/02/2023  Admission Diagnoses:  Discharge Diagnoses:  Principal Problem:   Status post surgery Active Problems:   Postoperative state   Discharged Condition: good  Hospital Course:  The patient presents with traumatic right great toe amputation sustained on 03/01/23. Patient was riding a lawnmower that went over pavement which shifted her body weight in her right foot slipped off the machine causing an amputation. Per ER physician, the patient then walked into her house and stepped in dog feces. She underwent bedside I&D in ER. She does not take any blood thinning medication. Does not know when her last tetanus shot was. Patient given pain medicine and foot dressed prior to arrival. Patient denies any other injury. She underwent ipsilateral right Achilles repair couple years ago. Accompanied at bedside by daughter and granddaughter. 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   We discussed the diagnosis, alternative treatment options, risks and benefits of the above surgical intervention, as well as alternative non-operative treatments. All questions/concerns were addressed and the patient/family demonstrated appropriate understanding of the diagnosis, the procedure, the postoperative course, and overall prognosis. The patient wished to proceed with surgical intervention and signed an informed surgical consent as such, in each others presence prior to surgery.   The patient underwent Right great toe amputation thru the level of the proximal phalanx (IP joint) with I&D and primary closure on 03/01/23. She did well intraoperatively and postoperatively. She was admitted for observation overnight and discharged POD1 after working with PT.  Consults: None  Significant Diagnostic Studies: None  Treatments: surgery: as above  Discharge  Exam: Blood pressure (!) 156/107, pulse 76, temperature 98.5 F (36.9 C), temperature source Oral, resp. rate 17, height 5' 5.96" (1.675 m), weight 71.7 kg, SpO2 98 %.  Gen: AAOx3, NAD Comfortable at rest   Right Lower Extremity: Skin intact Incision c/d/i HF/KE/KF/ADF/APF 5/5 SILT throughout DP, PT 2+ to palp CR < 2s  Disposition: Discharge disposition: 01-Home or Self Care       Discharge Instructions     Call MD / Call 911   Complete by: As directed    If you experience chest pain or shortness of breath, CALL 911 and be transported to the hospital emergency room.  If you develope a fever above 101 F, pus (white drainage) or increased drainage or redness at the wound, or calf pain, call your surgeon's office.   Constipation Prevention   Complete by: As directed    Drink plenty of fluids.  Prune juice may be helpful.  You may use a stool softener, such as Colace (over the counter) 100 mg twice a day.  Use MiraLax (over the counter) for constipation as needed.   Diet - low sodium heart healthy   Complete by: As directed    Increase activity slowly as tolerated   Complete by: As directed    Post-operative opioid taper instructions:   Complete by: As directed    POST-OPERATIVE OPIOID TAPER INSTRUCTIONS: It is important to wean off of your opioid medication as soon as possible. If you do not need pain medication after your surgery it is ok to stop day one. Opioids include: Codeine, Hydrocodone(Norco, Vicodin), Oxycodone(Percocet, oxycontin) and hydromorphone amongst others.  Long term and even short term use of opiods can cause: Increased pain response Dependence Constipation Depression Respiratory depression And more.  Withdrawal symptoms can include  Flu like symptoms Nausea, vomiting And more Techniques to manage these symptoms Hydrate well Eat regular healthy meals Stay active Use relaxation techniques(deep breathing, meditating, yoga) Do Not substitute Alcohol  to help with tapering If you have been on opioids for less than two weeks and do not have pain than it is ok to stop all together.  Plan to wean off of opioids This plan should start within one week post op of your joint replacement. Maintain the same interval or time between taking each dose and first decrease the dose.  Cut the total daily intake of opioids by one tablet each day Next start to increase the time between doses. The last dose that should be eliminated is the evening dose.      Weight bearing as tolerated   Complete by: As directed    In the post op shoe.   Laterality: right   Extremity: Lower      Allergies as of 03/02/2023   No Known Allergies      Medication List     TAKE these medications    EYE DROPS OP Place 2 drops into both eyes as needed (dry eye).   gabapentin 300 MG capsule Commonly known as: Neurontin Take 1 capsule (300 mg total) by mouth 3 (three) times daily.   ondansetron 4 MG tablet Commonly known as: ZOFRAN Take 1 tablet (4 mg total) by mouth every 6 (six) hours.               Discharge Care Instructions  (From admission, onward)           Start     Ordered   03/02/23 0000  Weight bearing as tolerated       Comments: In the post op shoe.  Question Answer Comment  Laterality right   Extremity Lower      03/02/23 1047            Follow-up Information     Netta Cedars, MD. Schedule an appointment as soon as possible for a visit in 1 week(s).   Specialty: Orthopedic Surgery Contact information: [address] [place] [address] [phone]                 Signed: Netta Cedars 03/10/2023, 9:56 AM

## 2024-01-08 ENCOUNTER — Ambulatory Visit (INDEPENDENT_AMBULATORY_CARE_PROVIDER_SITE_OTHER): Payer: BC Managed Care – PPO | Admitting: Gastroenterology

## 2024-01-08 ENCOUNTER — Encounter: Payer: Self-pay | Admitting: Gastroenterology

## 2024-01-08 VITALS — BP 140/80 | HR 70 | Ht 66.0 in | Wt 148.0 lb

## 2024-01-08 DIAGNOSIS — K219 Gastro-esophageal reflux disease without esophagitis: Secondary | ICD-10-CM | POA: Diagnosis not present

## 2024-01-08 DIAGNOSIS — K222 Esophageal obstruction: Secondary | ICD-10-CM | POA: Diagnosis not present

## 2024-01-08 DIAGNOSIS — R1310 Dysphagia, unspecified: Secondary | ICD-10-CM

## 2024-01-08 DIAGNOSIS — R1319 Other dysphagia: Secondary | ICD-10-CM

## 2024-01-08 MED ORDER — OMEPRAZOLE 20 MG PO CPDR: 20.0000 mg | DELAYED_RELEASE_CAPSULE | Freq: Every day | ORAL | 3 refills | Status: AC

## 2024-01-08 NOTE — Patient Instructions (Signed)
 _______________________________________________________  If your blood pressure at your visit was 140/90 or greater, please contact your primary care physician to follow up on this.  _______________________________________________________  If you are age 68 or older, your body mass index should be between 23-30. Your Body mass index is 23.89 kg/m. If this is out of the aforementioned range listed, please consider follow up with your Primary Care Provider.  If you are age 26 or younger, your body mass index should be between 19-25. Your Body mass index is 23.89 kg/m. If this is out of the aformentioned range listed, please consider follow up with your Primary Care Provider.   ________________________________________________________  The [facility] providers would like to encourage you to use MYCHART to communicate with providers for non-urgent requests or questions.  Due to long hold times on the telephone, sending your provider a message by [facility] may be a faster and more efficient way to get a response.  Please allow 48 business hours for a response.  Please remember that this is for non-urgent requests.  _______________________________________________________   Rosine have been scheduled for a Barium Esophogram at [facility] Entrance A (1st floor of the hospital) on 01-15-24 at 930am. Please arrive 30 minutes prior to your appointment for registration. Make certain not to have anything to eat or drink 3 hours prior to your test. If you need to reschedule for any reason, please contact radiology at [phone] to do so. __________________________________________________________________ A barium swallow is an examination that concentrates on views of the esophagus. This tends to be a double contrast exam (barium and two liquids which, when combined, create a gas to distend the wall of the oesophagus) or single contrast (non-ionic iodine based). The study is usually tailored to your  symptoms so a good history is essential. Attention is paid during the study to the form, structure and configuration of the esophagus, looking for functional disorders (such as aspiration, dysphagia, achalasia, motility and reflux) EXAMINATION You may be asked to change into a gown, depending on the type of swallow being performed. A radiologist and radiographer will perform the procedure. The radiologist will advise you of the type of contrast selected for your procedure and direct you during the exam. You will be asked to stand, sit or lie in several different positions and to hold a small amount of fluid in your mouth before being asked to swallow while the imaging is performed .In some instances you may be asked to swallow barium coated marshmallows to assess the motility of a solid food bolus. The exam can be recorded as a digital or video fluoroscopy procedure. POST PROCEDURE It will take 1-2 days for the barium to pass through your system. To facilitate this, it is important, unless otherwise directed, to increase your fluids for the next 24-48hrs and to resume your normal diet.  This test typically takes about 30 minutes to perform. __________________________________________________________________________________  Rosine have been scheduled for an endoscopy. Please follow written instructions given to you at your visit today.  If you use inhalers (even only as needed), please bring them with you on the day of your procedure.  If you take any of the following medications, they will need to be adjusted prior to your procedure:   DO NOT TAKE 7 DAYS PRIOR TO TEST- Trulicity (dulaglutide) Ozempic, Wegovy (semaglutide) Mounjaro (tirzepatide) Bydureon Bcise (exanatide extended release)  DO NOT TAKE 1 DAY PRIOR TO YOUR TEST Rybelsus (semaglutide) Adlyxin (lixisenatide) Victoza (liraglutide) Byetta (exanatide) ___________________________________________________________________________  It  was  a pleasure to see you today!  Thank you for trusting me with your gastrointestinal care!

## 2024-01-08 NOTE — Progress Notes (Signed)
 Discussed the use of AI scribe software for clinical note transcription with the patient, who gave verbal consent to proceed.  HPI : [name] is a 68 y.o. female with a history of anxiety and GERD complicated by peptic stricture who is referred to us  by Murleen Fine, MD for further evaluation management of worsening dysphagia.   She has a history of esophageal stricture, previously managed with multiple dilations between 2005 and 2016. Her symptoms have recently become unmanageable, with significant difficulty swallowing and choking after eating just two bites of food, requiring her to stand up to help the food pass. She describes the sensation of food sticking in her esophagus, causing fear and anxiety about eating. Over the past week, her symptoms have worsened, with even water getting stuck, sometimes resulting in regurgitation. The sensation of food sticking is now accompanied by pain. Previously, her symptoms were stable, but they have escalated rapidly.  She has a history of acid reflux, which she understands to be the cause of her esophageal stricture due to inflammation and scarring. She takes omeprazole , obtained over the counter, but does not take it consistently every day. She has undergone multiple dilations in the past, which have provided temporary relief.  She has lost weight, dropping from 150-160 pounds to 134 pounds, though she has recently gained back to 140-150 pounds. She manages her diet by consuming melted cheese, vitamin drinks, and hot foods like noodles, which seem to help.  She recently reengaged with medical care, after not seeing a doctor for 4 years because she was taking care of her mother.   Colonoscopy February 28, 2015 (Dr. Debrah) Status post subtotal colectomy with approximately 25 cm of rectum and sigmoid.  Anastomosis and ileum were unremarkable.  The remaining colon including rectum and sigmoid were also unremarkable.  Repeat colonoscopy 10  years   EGD February 28, 2015 (Dr. Debrah)  stricture at the [facility], dilated with 18 mm Maloney  EGD June 11, 2012 (Dr. Debrah) Stricture at the [facility] dilated with 18 mm Maloney 3 cm hiatal hernia Otherwise normal EGD   EGD Nov 2010 (Dr. 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   Urination, excessive at night      Past Surgical History:  Procedure Laterality Date   ABDOMINAL SURGERY  ~ 2006   got toothpick out of intestines   AMPUTATION Right 03/01/2023   Procedure: PARTIAL FOOT AMPUTATION;  Surgeon: Barton Drape, MD;  Location: [facility] OR;  Service: Orthopedics;  Laterality: Right;   APPENDECTOMY  2011   CHOLECYSTECTOMY  2006   COLECTOMY  2011   subtotal    DIAGNOSTIC LAPAROSCOPY  08/04/2012   LOA; VHR w/mesh   ECTOPIC PREGNANCY SURGERY  1982   twins   HERNIA REPAIR  2011   LEG  TENDON SURGERY  ~ 2005   left; still have a bolt in it   ureathal cyst     VAGINAL HYSTERECTOMY  1990's   enodmetriosis; ectopic pregnancies x 2; ovaries resected.   VENTRAL HERNIA REPAIR  08/04/2012   Procedure: LAPAROSCOPIC VENTRAL HERNIA;  Surgeon: Krystal JINNY Russell, MD;  Location: [facility] OR;  Service: General;  Laterality: N/A;  laparoscopic possible open ventral hernia repair with mesh   Family History  Problem Relation Age of Onset   Heart disease Mother    COPD Mother    Asthma Mother    Heart disease Father 38       CAD/PTCA    Alcohol abuse Father    Arthritis Sister    Colon cancer Maternal Uncle    Colon polyps Neg Hx    Prostate cancer Neg Hx    Rectal cancer Neg Hx    Social History   Tobacco Use   Smoking status: Former    Current packs/day: 0.00    Types: Cigarettes    Start date: 12/02/1990    Quit date: 12/02/1994    Years since quitting: 29.1   Smokeless tobacco: Never   Tobacco comments:    08/04/2012 quit smoking cigarettes 15-20 yr ago; really a recreational thing  Substance Use Topics   Alcohol use: Yes    Alcohol/week: 4.0 standard drinks of alcohol    Types: 4 Glasses of wine per week   Drug use: No   Current Outpatient Medications  Medication Sig Dispense Refill   Carboxymethylcellulose Sodium (EYE DROPS OP) Place 2 drops into both eyes as needed (dry eye).     gabapentin  (NEURONTIN ) 300 MG capsule Take 1 capsule (300 mg total) by mouth 3 (three) times daily. 42 capsule 0   ondansetron  (ZOFRAN ) 4 MG tablet Take 1 tablet (4 mg total) by mouth every 6 (six) hours. (Patient not taking: Reported on 03/02/2023) 12 tablet 0   No current facility-administered medications for this visit.   No Known Allergies   Review of Systems: All systems reviewed and negative except where noted in HPI.    No results found.  Physical Exam: BP (!) 140/80   Pulse 70   Ht 5' 6 (1.676 m)   Wt 148 lb (67.1 kg)   BMI 23.89 kg/m  Constitutional: Pleasant,well-developed, Caucasian female female in no acute distress. HEENT: Normocephalic and atraumatic. Conjunctivae are normal. No scleral icterus. Neck supple.  Cardiovascular: Normal rate, regular rhythm.  Pulmonary/chest: Effort normal and breath sounds normal. No wheezing, rales or rhonchi. Abdominal: Soft, nondistended, nontender. Bowel sounds active throughout. There are no masses palpable. No hepatomegaly. Extremities: no edema Lymphadenopathy: No cervical adenopathy noted. Neurological: Alert and oriented to person place and time. Skin: Skin is  warm and dry. No rashes noted. Psychiatric: Normal mood and affect. Behavior is normal.  Appears anxious  CBC    Component Value Date/Time   WBC 6.2 03/01/2023 1845   RBC 4.66 03/01/2023 1845   HGB 14.6 03/01/2023 1909   HGB 13.7 02/16/2018 1448   HCT 43.0 03/01/2023 1909   HCT 40.3 02/16/2018 1448   PLT 274 03/01/2023 1845   PLT 284 02/16/2018 1448   MCV 91.8 03/01/2023 1845   MCV 90 02/16/2018 1448   MCH 30.7 03/01/2023 1845   MCHC 33.4 03/01/2023 1845   RDW 12.4 03/01/2023 1845   RDW 13.3 02/16/2018 1448   LYMPHSABS 1.4 02/01/2019 0208   LYMPHSABS 1.7 02/16/2018 1448   MONOABS 0.7 02/01/2019 0208  EOSABS 0.1 02/01/2019 0208   EOSABS 0.1 02/16/2018 1448   BASOSABS 0.0 02/01/2019 0208   BASOSABS 0.0 02/16/2018 1448    CMP     Component Value Date/Time   NA 140 03/01/2023 1909   NA 141 02/16/2018 1448   K 3.5 03/01/2023 1909   CL 104 03/01/2023 1909   CO2 21 (L) 03/01/2023 1845   GLUCOSE 98 03/01/2023 1909   BUN 22 03/01/2023 1909   BUN 11 02/16/2018 1448   CREATININE 1.20 (H) 03/01/2023 1909   CREATININE 0.69 09/02/2016 1035   CALCIUM 9.4 03/01/2023 1845   PROT 6.9 03/01/2023 1845   PROT 6.8 02/16/2018 1448   ALBUMIN 4.3 03/01/2023 1845   ALBUMIN 4.5 02/16/2018 1448   AST 23 03/01/2023 1845   ALT 17 03/01/2023 1845   ALKPHOS 36 (L) 03/01/2023 1845   BILITOT 0.8 03/01/2023 1845   BILITOT 0.4 02/16/2018 1448   GFRNONAA 60 (L) 03/01/2023 1845   GFRAA >60 02/01/2019 0208       Latest Ref Rng & Units 03/01/2023    7:09 PM 03/01/2023    6:45 PM 02/01/2019    2:08 AM  CBC EXTENDED  WBC 4.0 - 10.5 K/uL  6.2  8.4   RBC 3.87 - 5.11 MIL/uL  4.66  4.16   Hemoglobin 12.0 - 15.0 g/dL 85.3  85.6  87.4   HCT 36.0 - 46.0 % 43.0  42.8  38.5   Platelets 150 - 400 K/uL  274  242   NEUT# 1.7 - 7.7 K/uL   6.1   Lymph# 0.7 - 4.0 K/uL   1.4       ASSESSMENT AND PLAN:  67 year old female with history of reflux esophagitis/peptic stricture, who underwent numerous  dilations between 2005 and 2016, now with recurrence of dysphagia which patient reports has been rapidly progressive and more severe than in the past.  While she did lose weight initially, she has regained some this weight.  Patient reports trouble swallowing liquids, which would be unusual for a peptic stricture.  This may indicate a very high-grade stricture.  We will schedule her for an upper endoscopy in [facility], but will get a barium swallow in the meantime.  If the barium swallow indicates a severe or complicated stenosis, we may reschedule her procedure for the hospital setting.  Esophageal Stricture Recurrent esophageal stricture with significant dysphagia, worsening over the past week. She reports choking after eating, with food and even water getting stuck. History of multiple dilations between 2005 and 2016, likely secondary to chronic acid reflux causing inflammation and scarring.  The patient's name concerned about a different problem with her swallowing given the difference in severity.  Discussed the need for an upper endoscopy with dilation to relieve symptoms. Risks of the procedure include perforation and bleeding, but these are rare. Benefits include improved swallowing and reduced dysphagia. Alternatives include continued medical management with acid-reducing medications, but this is less effective for severe strictures. - Schedule upper endoscopy with dilation - Prescribe omeprazole  daily  GERD We discussed the role of GERD and causing/contributing to peptic strictures.  We discussed the importance of continuing acid suppression therapy to prevent recurrence of peptic strictures. -Restart omeprazole  daily  The details, risks (including bleeding, perforation, infection, missed lesions, medication reactions and possible hospitalization or surgery if complications occur), benefits, and alternatives to EGD with possible biopsy and possible dilation were discussed with the patient and she  consents to proceed.   Torra Pala E. Stacia, MD [facility]  Murleen Fine, MD

## 2024-01-15 ENCOUNTER — Ambulatory Visit (HOSPITAL_COMMUNITY)
Admission: RE | Admit: 2024-01-15 | Discharge: 2024-01-15 | Disposition: A | Discharge status: Home / Self Care | Payer: BC Managed Care – PPO | Source: Ambulatory Visit | Attending: Gastroenterology | Admitting: Gastroenterology

## 2024-01-15 DIAGNOSIS — R1319 Other dysphagia: Secondary | ICD-10-CM | POA: Insufficient documentation

## 2024-01-15 DIAGNOSIS — K219 Gastro-esophageal reflux disease without esophagitis: Secondary | ICD-10-CM | POA: Diagnosis present

## 2024-01-19 ENCOUNTER — Ambulatory Visit (AMBULATORY_SURGERY_CENTER): Payer: BC Managed Care – PPO | Admitting: Gastroenterology

## 2024-01-19 ENCOUNTER — Encounter: Payer: Self-pay | Admitting: Gastroenterology

## 2024-01-19 VITALS — BP 170/84 | HR 68 | Temp 97.9°F | Resp 14 | Ht 66.0 in | Wt 148.0 lb

## 2024-01-19 DIAGNOSIS — K222 Esophageal obstruction: Secondary | ICD-10-CM | POA: Diagnosis not present

## 2024-01-19 DIAGNOSIS — K449 Diaphragmatic hernia without obstruction or gangrene: Secondary | ICD-10-CM

## 2024-01-19 DIAGNOSIS — R1319 Other dysphagia: Secondary | ICD-10-CM

## 2024-01-19 MED ORDER — SODIUM CHLORIDE 0.9 % IV SOLN: 500.0000 mL | INTRAVENOUS | Status: DC

## 2024-01-19 NOTE — Op Note (Signed)
 [facility] Patient Name: [name] Procedure Date: 01/19/2024 6:59 AM MRN: [id number] Endoscopist: Lorin Picket E. Tomasa Rand , MD, [id number] Age: 68 Referring MD:  Date of Birth: 1956-06-24 Gender: Female Account #: [id number] Procedure:                Upper GI endoscopy Indications:              Dysphagia Medicines:                Monitored Anesthesia Care Procedure:                Pre-Anesthesia Assessment:                           - Prior to the procedure, a History and Physical                            was performed, and patient medications and                            allergies were reviewed. The patient's tolerance of                            previous anesthesia was also reviewed. The risks                            and benefits of the procedure and the sedation                            options and risks were discussed with the patient.                            All questions were answered, and informed consent                            was obtained. Prior Anticoagulants: The patient has                            taken no anticoagulant or antiplatelet agents. ASA                            Grade Assessment: II - A patient with mild systemic                            disease. After reviewing the risks and benefits,                            the patient was deemed in satisfactory condition to                            undergo the procedure.                           After obtaining informed consent, the endoscope was  passed under direct vision. Throughout the                            procedure, the patient's blood pressure, pulse, and                            oxygen saturations were monitored continuously. The                            GIF [id number] #[id number] was introduced through the                            mouth, and advanced to the second part of duodenum.                            The upper GI endoscopy was  accomplished without                            difficulty. The patient tolerated the procedure                            well. Scope In: Scope Out: Findings:                 One benign-appearing, intrinsic mild stenosis was                            found in the distal esophagus. This stenosis                            measured 1.1 cm (inner diameter) x less than one cm                            (in length). The stenosis was traversed. A                            guidewire was placed and the scope was withdrawn.                            Dilation was performed with a Savary dilator with                            mild resistance at 17 mm. The dilation site was                            examined following endoscope reinsertion and showed                            moderate mucosal disruption. Estimated blood loss                            was minimal.                           The exam  of the esophagus was otherwise normal.                           A 4 cm hiatal hernia was present.                           The exam of the stomach was otherwise normal.                           The examined duodenum was normal. Complications:            No immediate complications. Estimated Blood Loss:     Estimated blood loss was minimal. Impression:               - Benign-appearing esophageal stenosis. Dilated.                           - 4 cm hiatal hernia.                           - Normal examined duodenum.                           - No specimens collected. Recommendation:           - Patient has a contact number available for                            emergencies. The signs and symptoms of potential                            delayed complications were discussed with the                            patient. Return to normal activities tomorrow.                            Written discharge instructions were provided to the                            patient.                           -  Soft diet today, then resume regular diet                            tomorrow if no pain with swallowing.                           - Continue present medications, including                            omeprazole daily                           - Given hiatal hernia and symptomatic esophageal  stricture, would recommend continuing omeprazole                            treatment indefinitely.                           - Can repeat dilation as needed based on symptoms. Brynn Reznik E. Tomasa Rand, MD 01/19/2024 8:25:55 AM This report has been signed electronically.

## 2024-01-19 NOTE — Patient Instructions (Signed)
-  Handout on post dilation diet, Hiatal hernia, and stricture provided -Continue present medications including omeprazole daily   YOU HAD AN ENDOSCOPIC PROCEDURE TODAY AT THE [facility]:   Refer to the procedure report that was given to you for any specific questions about what was found during the examination.  If the procedure report does not answer your questions, please call your gastroenterologist to clarify.  If you requested that your care partner not be given the details of your procedure findings, then the procedure report has been included in a sealed envelope for you to review at your convenience later.  YOU SHOULD EXPECT: Some feelings of bloating in the abdomen. Passage of more gas than usual.  Walking can help get rid of the air that was put into your GI tract during the procedure and reduce the bloating. If you had a lower endoscopy (such as a colonoscopy or flexible sigmoidoscopy) you may notice spotting of blood in your stool or on the toilet paper. If you underwent a bowel prep for your procedure, you may not have a normal bowel movement for a few days.  Please Note:  You might notice some irritation and congestion in your nose or some drainage.  This is from the oxygen used during your procedure.  There is no need for concern and it should clear up in a day or so.  SYMPTOMS TO REPORT IMMEDIATELY:  Following upper endoscopy (EGD)  Vomiting of blood or coffee ground material  New chest pain or pain under the shoulder blades  Painful or persistently difficult swallowing  New shortness of breath  Fever of 100F or higher  Black, tarry-looking stools  For urgent or emergent issues, a gastroenterologist can be reached at any hour by calling [phone]. Do not use MyChart messaging for urgent concerns.    DIET:  We do recommend a small meal at first, but then you may proceed to your regular diet.  Drink plenty of fluids but you should avoid alcoholic beverages for  24 hours.  ACTIVITY:  You should plan to take it easy for the rest of today and you should NOT DRIVE or use heavy machinery until tomorrow (because of the sedation medicines used during the test).    FOLLOW UP: Our staff will call the number listed on your records the next business day following your procedure.  We will call around 7:15- 8:00 am to check on you and address any questions or concerns that you may have regarding the information given to you following your procedure. If we do not reach you, we will leave a message.     If any biopsies were taken you will be contacted by phone or by letter within the next 1-3 weeks.  Please call us at [phone] if you have not heard about the biopsies in 3 weeks.    SIGNATURES/CONFIDENTIALITY: You and/or your care partner have signed paperwork which will be entered into your electronic medical record.  These signatures attest to the fact that that the information above on your After Visit Summary has been reviewed and is understood.  Full responsibility of the confidentiality of this discharge information lies with you and/or your care-partner.

## 2024-01-19 NOTE — Progress Notes (Signed)
 Called to room to assist during endoscopic procedure.  Patient ID and intended procedure confirmed with present staff. Received instructions for my participation in the procedure from the performing physician.

## 2024-01-19 NOTE — Progress Notes (Signed)
 A/o x 3, VSS, gd SR's, pleased with anesthesia, report to RN

## 2024-01-19 NOTE — Addendum Note (Signed)
 Addended by: Rosine Door on: 01/19/2024 10:28 AM   Modules accepted: Orders

## 2024-01-19 NOTE — Progress Notes (Signed)
 VS by [facility]  Pt's states no medical or surgical changes since previsit or office visit.

## 2024-01-19 NOTE — Progress Notes (Signed)
 History and Physical Interval Note:  01/19/2024 7:56 AM  [name]  has presented today for endoscopic procedure(s), with the diagnosis of  Encounter Diagnosis  Name Primary?   Esophageal dysphagia Yes  .  The various methods of evaluation and treatment have been discussed with the patient and/or family. After consideration of risks, benefits and other options for treatment, the patient has consented to  the endoscopic procedure(s).   The patient's history has been reviewed, patient examined, no change in status, stable for endoscopic procedure(s).  I have reviewed the patient's chart and labs.  Questions were answered to the patient's satisfaction.    Since her visit, she underwent a barium swallow which showed evidence of mucosal thickening in the distal esophagus, but no obvious stricture.  The barium tablet passed normally.   Khriz Liddy E. Tomasa Rand, MD [facility]

## 2024-01-20 ENCOUNTER — Telehealth: Payer: Self-pay | Admitting: *Deleted

## 2024-01-20 NOTE — Telephone Encounter (Signed)
 Attempted f/u phone call. No answer. Left message.

## 2024-01-20 NOTE — Progress Notes (Signed)
 Results were discussed with the patient at the time of her EGD
